# Patient Record
Sex: Male | Born: 1945
Health system: Southern US, Community
[De-identification: ages and names within clinical notes are randomized; demographics above are authoritative.]

## PROBLEM LIST (undated history)

## (undated) DIAGNOSIS — N529 Male erectile dysfunction, unspecified: Secondary | ICD-10-CM

## (undated) DIAGNOSIS — I10 Essential (primary) hypertension: Secondary | ICD-10-CM

## (undated) DIAGNOSIS — A159 Respiratory tuberculosis unspecified: Secondary | ICD-10-CM

## (undated) DIAGNOSIS — R7303 Prediabetes: Secondary | ICD-10-CM

## (undated) DIAGNOSIS — M1990 Unspecified osteoarthritis, unspecified site: Secondary | ICD-10-CM

## (undated) DIAGNOSIS — I739 Peripheral vascular disease, unspecified: Secondary | ICD-10-CM

## (undated) DIAGNOSIS — I8390 Asymptomatic varicose veins of unspecified lower extremity: Secondary | ICD-10-CM

## (undated) DIAGNOSIS — Z972 Presence of dental prosthetic device (complete) (partial): Secondary | ICD-10-CM

## (undated) DIAGNOSIS — K635 Polyp of colon: Secondary | ICD-10-CM

## (undated) DIAGNOSIS — R413 Other amnesia: Secondary | ICD-10-CM

## (undated) DIAGNOSIS — K4090 Unilateral inguinal hernia, without obstruction or gangrene, not specified as recurrent: Secondary | ICD-10-CM

## (undated) DIAGNOSIS — E785 Hyperlipidemia, unspecified: Secondary | ICD-10-CM

## (undated) DIAGNOSIS — Z8679 Personal history of other diseases of the circulatory system: Secondary | ICD-10-CM

## (undated) DIAGNOSIS — F0390 Unspecified dementia without behavioral disturbance: Secondary | ICD-10-CM

## (undated) DIAGNOSIS — N1831 Chronic kidney disease, stage 3a: Secondary | ICD-10-CM

## (undated) HISTORY — PX: LAPAROSCOPIC LOW ANTERIOR RESECTION: SHX5904

## (undated) HISTORY — DX: Male erectile dysfunction, unspecified: N52.9

## (undated) HISTORY — DX: Hyperlipidemia, unspecified: E78.5

## (undated) HISTORY — PX: APPENDECTOMY: SHX54

## (undated) HISTORY — PX: INGUINAL HERNIA REPAIR: SHX194

## (undated) HISTORY — DX: Asymptomatic varicose veins of unspecified lower extremity: I83.90

## (undated) HISTORY — DX: Other amnesia: R41.3

## (undated) HISTORY — PX: KNEE SURGERY: SHX244

## (undated) HISTORY — DX: Polyp of colon: K63.5

## (undated) HISTORY — PX: COLONOSCOPY: SHX174

---

## 1998-04-05 HISTORY — PX: INGUINAL HERNIA REPAIR: SHX194

## 1999-07-27 ENCOUNTER — Ambulatory Visit (HOSPITAL_COMMUNITY): Admission: RE | Admit: 1999-07-27 | Discharge: 1999-07-27 | Payer: Self-pay | Admitting: Gastroenterology

## 1999-11-27 ENCOUNTER — Ambulatory Visit (HOSPITAL_COMMUNITY): Admission: RE | Admit: 1999-11-27 | Discharge: 1999-11-27 | Payer: Self-pay | Admitting: Family Medicine

## 1999-11-27 ENCOUNTER — Encounter: Payer: Self-pay | Admitting: Family Medicine

## 2001-12-12 ENCOUNTER — Ambulatory Visit (HOSPITAL_COMMUNITY): Admission: RE | Admit: 2001-12-12 | Discharge: 2001-12-12 | Payer: Self-pay | Admitting: Gastroenterology

## 2003-02-11 ENCOUNTER — Ambulatory Visit (HOSPITAL_COMMUNITY): Admission: RE | Admit: 2003-02-11 | Discharge: 2003-02-11 | Payer: Self-pay | Admitting: Surgery

## 2003-06-25 ENCOUNTER — Ambulatory Visit: Admission: RE | Admit: 2003-06-25 | Discharge: 2003-06-25 | Payer: Self-pay | Admitting: Family Medicine

## 2003-06-26 ENCOUNTER — Encounter (HOSPITAL_BASED_OUTPATIENT_CLINIC_OR_DEPARTMENT_OTHER): Admission: RE | Admit: 2003-06-26 | Discharge: 2003-09-19 | Payer: Self-pay | Admitting: Internal Medicine

## 2003-09-18 ENCOUNTER — Encounter (HOSPITAL_BASED_OUTPATIENT_CLINIC_OR_DEPARTMENT_OTHER): Admission: RE | Admit: 2003-09-18 | Discharge: 2003-10-21 | Payer: Self-pay | Admitting: Internal Medicine

## 2006-10-04 DIAGNOSIS — K635 Polyp of colon: Secondary | ICD-10-CM

## 2006-10-04 HISTORY — DX: Polyp of colon: K63.5

## 2010-09-08 ENCOUNTER — Encounter (INDEPENDENT_AMBULATORY_CARE_PROVIDER_SITE_OTHER): Payer: Medicare Other | Admitting: Vascular Surgery

## 2010-09-08 DIAGNOSIS — I714 Abdominal aortic aneurysm, without rupture: Secondary | ICD-10-CM

## 2010-09-09 NOTE — Consult Note (Signed)
NEW PATIENT CONSULTATION  [name] Juarez, [name] Juarez DOB:  [birth year]                                       09/08/2010 ZHYQM#:[id number]  Mr. Vultaggio is a 65 year old male referred by Dr. Tiburcio Pea for an infrarenal abdominal aortic aneurysm measuring 4.4 cm in maximum diameter.  He recently had an ultrasound study as a screening tool and was found to have this aneurysm in the distal aorta.  He was aware that he had an aneurysm and has had no history of abdominal or back symptoms.  Also of significance is the fact that he has severe varicose veins in his right leg and these occurred about 10 years ago when he was awaiting knee surgery in [place] on the right side and had his leg tightly wrapped. Skin has changed significantly over the years becoming quite dark with an ulceration appearing on 1 occasion possibly due to some trauma.  He has had no history of DVT or thrombophlebitis.  He has long-leg elastic compression stockings which are 20-30 mm gradient but he has not worn them a regular basis.  Chronic medical problems: 1. Hyperlipidemia. 2. Varicose veins right leg with chronic skin changes. 3. Colon polyps. 4. Degenerative joint disease right knee. 5. History of tobacco abuse. 6. Negative for coronary artery disease, diabetes, hypertension or     stroke.  SOCIAL HISTORY:  He is married and has 2 children, retired Forensic scientist.  He smokes a pack cigarettes per day for 50 years and drinks 3 beers per day.  FAMILY HISTORY:  Positive for tuberculosis in his father.  Negative for coronary artery disease, diabetes, and stroke.  REVIEW OF SYSTEMS:  Positive for arthritis particularly in the right knee area.  He does have some aching and throbbing in the right thigh and itching of the skin in the right ankle area and chronic edema.  All other systems are negative in complete review of systems.  PHYSICAL EXAMINATION:  Blood pressure 136/74, heart rate 75, respirations  16. GENERAL:  He is a well-developed, well-nourished male in no apparent distress, alert and oriented x3. HEENT:  Exam normal for age.  EOMs intact. LUNGS:  Clear to auscultation.  No rhonchi or wheezing. CARDIOVASCULAR:  Regular rhythm.  No murmurs.  Carotid pulses are 3+ with no bruits audible. ABDOMEN:  Soft, nontender.  There is a 4-5 cm pulsatile mass which is palpable and nontender. MUSCULOSKELETAL:  Exam is free of major deformities. NEUROLOGIC:  Normal. SKIN:  Exam reveals bulging varicosities in the right medial thigh over a large area measuring about 10 x 15 cm with multiple tense varicosities, also a large varix in the distal thigh and proximal calf. He has severe hyperpigmentation and lipodermatosclerosis involving the lower third of the leg with crusty scaly skin medially but no active ulcer.  He has 3+ femoral, popliteal, and dorsalis pedis pulses palpable.  The aneurysm is not large enough to treat this time.  I will see him back in 1 year with a CT angiogram to determine if he is a candidate for stent grafting and also to monitor the size.  I discussed this with him at length.  Varicose veins are an issue which need to be treated.  He has severe skin changes and is symptomatic.  He does have appropriate long-leg stockings (20-30 mm) which he will begin wearing today as well as trying elevation  and ibuprofen.  I will see him in 3 months with a duplex scan of his right lower extremity to check for reflux and see what options are available.    Quita Skye Hart [place], M.D. Electronically Signed  JDL/MEDQ  D:  09/08/2010  T:  09/09/2010  Job:  [id number]

## 2010-11-19 ENCOUNTER — Encounter: Payer: Self-pay | Admitting: Vascular Surgery

## 2010-12-14 ENCOUNTER — Encounter: Payer: Self-pay | Admitting: Vascular Surgery

## 2010-12-15 ENCOUNTER — Ambulatory Visit (INDEPENDENT_AMBULATORY_CARE_PROVIDER_SITE_OTHER): Payer: Medicare Other | Admitting: Vascular Surgery

## 2010-12-15 ENCOUNTER — Ambulatory Visit (INDEPENDENT_AMBULATORY_CARE_PROVIDER_SITE_OTHER): Payer: Medicare Other | Admitting: *Deleted

## 2010-12-15 VITALS — BP 169/75 | HR 64 | Resp 20 | Ht 69.0 in | Wt 178.0 lb

## 2010-12-15 DIAGNOSIS — I83893 Varicose veins of bilateral lower extremities with other complications: Secondary | ICD-10-CM

## 2010-12-15 DIAGNOSIS — I83899 Varicose veins of unspecified lower extremities with other complications: Secondary | ICD-10-CM

## 2010-12-15 NOTE — Progress Notes (Signed)
Addended by: Clementeen Hoof on: 12/15/2010 10:46 AM   Modules accepted: Orders

## 2010-12-15 NOTE — Progress Notes (Signed)
Subjective:     Patient ID: [name] Juarez, male   DOB: 10/24/1945, 65 y.o.   MRN: [id number]  HPI this 65 year old male returns for further evaluation of his severe venous insufficiency of the right leg . He has had bulging varicosities in the right medial thigh and medial calf extending down to the ankle for 20 years or so. These have continued to enlarge and become increasingly symptomatic with aching throbbing and burning discomfort. He also had was difficult to feel in the right ankle area.a history of an ulceration which was traumatic in origin butDifficult to heal and the right ankle area. He has no history of DVT or thrombophlebitis . He has been wearing a long-leg plastic compression stockings-20-30 mm gradient as well as trying elevation of the leg and ibuprofen with no improvement in his symptomatology.  Past Medical History  Diagnosis Date  . Hyperlipidemia   . Varicose veins   . Erectile dysfunction   . Colon polyp 10/2006    History  Substance Use Topics  . Smoking status: Current Everyday Smoker -- 1.0 packs/day  . Smokeless tobacco: Not on file  . Alcohol Use: 12.6 oz/week    21 Cans of beer per week    No family history on file.  No Known Allergies  Current outpatient prescriptions:sildenafil (VIAGRA) 100 MG tablet, Take 100 mg by mouth as needed.  , Disp: , Rfl:   BP 169/75  Pulse 64  Resp 20  Ht 5\' 9"  (1.753 m)  Wt 178 lb (80.74 kg)  BMI 26.29 kg/m2  Body mass index is 26.29 kg/(m^2).         Review of Systems     Objective:   Physical Exam blood pressure 110/75 heart rate 64 respirations 20 Is alert and oriented x3 in no apparent distress Lower extremity exam reveals large nest of bulging varicosities in the medial thigh of the great saphenous system extending down into the medial calf into the ankle area with hyperpigmentation. He has 3+ dorsalis pedis pulses palpable.  Today I ordered a venous duplex exam which are reviewed and interpreted. He has  gross reflux in the right great saphenous system beginning at the junction extending to the knee. The deep system has mild reflux and no DVT.     Assessment:     Severe venous insufficiency right leg with valvular incompetence right great saphenous system severe reflux and bulging varicosities which are quite symptomatic.     Plan:     We'll schedule for laser ablation right great saphenous vein. This may require to entry sites as there is quite a bit of tortuosity related to the bulging varicosities in the thigh. Following this he'll return in 3 months for consideration of stab phlebectomy. We will proceed in the near future with his initial procedure

## 2010-12-22 NOTE — Procedures (Unsigned)
LOWER EXTREMITY VENOUS REFLUX EXAM  INDICATION:  Varicose veins.  EXAM:  Using color-flow imaging and pulse Doppler spectral analysis, the right common femoral, superficial femoral, popliteal, posterior tibial, greater and lesser saphenous veins are evaluated.  There is evidence suggesting deep venous reflux of >500 milliseconds noted in the common femoral and femoral veins.  The right saphenofemoral junction and nontortuous GSV demonstrates reflux of >500 milliseconds.  The right proximal short saphenous vein demonstrates competency.  GSV Diameter (used if found to be incompetent only)                                           Right    Left Proximal Greater Saphenous Vein           0.7 cm   cm Proximal-to-mid-thigh                     cm       cm Mid thigh                                 0.4 cm   cm Mid-distal thigh                          cm       cm Distal thigh                              0.71 cm  cm Knee                                      0.41 cm  cm  IMPRESSION:  Right greater saphenous, common femoral, and femoral vein reflux >572milliseconds noted, as described above.  ___________________________________________ Quita Skye. Hart [place], M.D.  CH/MEDQ  D:  12/16/2010  T:  12/16/2010  Job:  [id number]

## 2011-01-08 ENCOUNTER — Encounter: Payer: Self-pay | Admitting: Surgery

## 2011-01-11 ENCOUNTER — Encounter: Payer: Self-pay | Admitting: Vascular Surgery

## 2011-01-11 ENCOUNTER — Ambulatory Visit (INDEPENDENT_AMBULATORY_CARE_PROVIDER_SITE_OTHER): Payer: Medicare Other | Admitting: Vascular Surgery

## 2011-01-11 VITALS — BP 179/81 | HR 79 | Resp 20 | Ht 69.0 in | Wt 173.0 lb

## 2011-01-11 DIAGNOSIS — I83893 Varicose veins of bilateral lower extremities with other complications: Secondary | ICD-10-CM

## 2011-01-11 NOTE — Progress Notes (Signed)
Subjective:     Patient ID: [name] Juarez, male   DOB: [birth year], 65 y.o.   MRN: [id number]  HPI this 64 year old male patient underwent laser ablation of the right great saphenous vein for painful bulging varicosities secondary to valvular incompetence of the right great saphenous system this was done under local tumescent anesthesia. He tolerated the procedure well. This required 2 entrance sites because of tortuosity of the great saphenous vein and large bulging varicosities in the mid thigh. The first entrance site was in the mid calf area in the GS V. The guidewire would not traverse the entire GS V. This required a second entrance site the junction of the mid to proximal thigh. This was up to the saphenofemoral junction. He tolerated the procedure well.   Review of Systems     Objective:   Physical Exam blood pressure 179/81 heart rate 79 respirations 20     Assessment:    successful and well tolerated laser ablation of right great saphenous system requiring 2 separate entrance sites in right GSV    Plan:    return in one week for followup duplex scan of right leg to confirm closure of right GS V. Will take ibuprofen as directed and wear a long-leg elastic compression stockings and elevate legs when necessary.

## 2011-01-11 NOTE — Progress Notes (Signed)
40981 R GSV

## 2011-01-11 NOTE — Progress Notes (Signed)
Addended by: Clementeen Hoof on: 01/11/2011 10:24 AM   Modules accepted: Orders

## 2011-01-12 ENCOUNTER — Telehealth: Payer: Self-pay | Admitting: *Deleted

## 2011-01-12 NOTE — Telephone Encounter (Signed)
01/12/2011  Time: 9:58 AM   Patient Name: [name] Juarez  Patient of: Hart Rochester  Procedure:36478 L GSV  Reached patient at home. Had a good night. Some soreness. Using ice and taking Ibuprofen as instructed. Will see him next week for his ultrasound and f/u with Dr. Hart Rochester.

## 2011-01-18 ENCOUNTER — Encounter: Payer: Self-pay | Admitting: Vascular Surgery

## 2011-01-19 ENCOUNTER — Other Ambulatory Visit: Payer: Self-pay | Admitting: *Deleted

## 2011-01-19 ENCOUNTER — Ambulatory Visit (INDEPENDENT_AMBULATORY_CARE_PROVIDER_SITE_OTHER): Payer: Medicare Other | Admitting: Vascular Surgery

## 2011-01-19 ENCOUNTER — Other Ambulatory Visit (INDEPENDENT_AMBULATORY_CARE_PROVIDER_SITE_OTHER): Payer: Medicare Other | Admitting: *Deleted

## 2011-01-19 ENCOUNTER — Encounter: Payer: Self-pay | Admitting: Vascular Surgery

## 2011-01-19 VITALS — BP 182/98 | HR 79 | Resp 16 | Ht 69.0 in | Wt 175.3 lb

## 2011-01-19 DIAGNOSIS — Z48812 Encounter for surgical aftercare following surgery on the circulatory system: Secondary | ICD-10-CM

## 2011-01-19 DIAGNOSIS — I83893 Varicose veins of bilateral lower extremities with other complications: Secondary | ICD-10-CM

## 2011-01-19 NOTE — Progress Notes (Signed)
Subjective:     Patient ID: [name] Juarez, male   DOB: Nov 16, 1945, 65 y.o.   MRN: [id number]  HPI this 65 year old male patient returns 1 week post laser ablation right great saphenous vein appeared he has a history of a venous stasis ulcer recently healed and large bulging varicosities in the medial thigh and calf which have been quite symptomatic he denies any swelling or change in his symptomatology in the past week. He has had some mild to moderate discomfort along the course of the right great saphenous vein from the groin to the knee. Said no shortness of breath, hemoptysis, or dyspnea on exertion..  Past Medical History  Diagnosis Date  . Hyperlipidemia   . Varicose veins   . Erectile dysfunction   . Colon polyp 10/2006    History  Substance Use Topics  . Smoking status: Current Everyday Smoker -- 1.0 packs/day  . Smokeless tobacco: Not on file  . Alcohol Use: 12.6 oz/week    21 Cans of beer per week    No family history on file.  No Known Allergies  Current outpatient prescriptions:sildenafil (VIAGRA) 100 MG tablet, Take 100 mg by mouth as needed.  , Disp: , Rfl:   BP 182/98  Pulse 79  Resp 16  Ht 5\' 9"  (1.753 m)  Wt 175 lb 4.8 oz (79.516 kg)  BMI 25.89 kg/m2  Body mass index is 25.89 kg/(m^2).         Review of Systems     Objective:   Physical Exam blood pressure 180/98 heart rate 79 respirations 16 Gen. he is well-developed well-nourished male in no apparent distress alert and oriented x3 Chest is clear no rhonchi or wheezing Lower extremity exam reveals 3+ femoral and dorsalis pedis pulse in the right leg. He has a large nest of varicosities in the right medial thigh which are partially thrombosed. He continues to have bulging varicosities in the medial calf but not as tense as they were prior to the ablation. There is mild tenderness  along the course of the saphenous vein.  Today I ordered a venous duplex exam of the right leg which I reviewed and  interpreted. There is successful closure of the right great saphenous vein. There is mild bulging into the common femoral vein at the level of the saphenofemoral junction with no DVT.    Assessment:    successful ablation right great saphenous vein for history of stasis ulcer and bulging painful varicosities.    Plan:    return in 3 months with venous duplex exam to check saphenofemoral junction. Continue long-leg elastic compression support Begin aspirin 81 mg per day Will check him in 3 months to see if there is a need for stab phlebectomy of secondary varicosities sign

## 2011-01-25 NOTE — Procedures (Unsigned)
DUPLEX DEEP VENOUS EXAM - LOWER EXTREMITY  INDICATION:  One week followup of right GSV ablation.  HISTORY:  Edema:  Yes. Trauma/Surgery:  Right GSV ablation. Pain:  Yes. PE:  No. Previous DVT:  No. Anticoagulants:  No. Other:  DUPLEX EXAM:               CFV   SFV   PopV  PTV    GSV               R  L  R  L  R  L  R   L  R  L Thrombosis    o     o     o     o      + Spontaneous   +     +     +     +      o Phasic        +     +     +     +      o Augmentation  +     +     +     +      o Compressible  p     +     +     +      o Competent  Legend:  + - yes  o - no  p - partial  D - decreased   IMPRESSION: 1. Evidence of mild extension of thrombus into the common femoral     vein, status post greater saphenous vein ablation. 2. The great saphenous vein is large and tortuous and still     demonstrates segmental reflux in areas where ablation is     nonocclusive.        _____________________________ Quita Skye. Hart [place], M.D.  LT/MEDQ  D:  01/19/2011  T:  01/19/2011  Job:  [id number]

## 2011-03-12 ENCOUNTER — Encounter: Payer: Self-pay | Admitting: Vascular Surgery

## 2011-04-06 HISTORY — PX: OTHER SURGICAL HISTORY: SHX169

## 2011-04-26 ENCOUNTER — Encounter: Payer: Self-pay | Admitting: Vascular Surgery

## 2011-04-27 ENCOUNTER — Encounter: Payer: Self-pay | Admitting: Vascular Surgery

## 2011-04-27 ENCOUNTER — Other Ambulatory Visit (INDEPENDENT_AMBULATORY_CARE_PROVIDER_SITE_OTHER): Payer: Medicare Other | Admitting: *Deleted

## 2011-04-27 ENCOUNTER — Ambulatory Visit (INDEPENDENT_AMBULATORY_CARE_PROVIDER_SITE_OTHER): Payer: Medicare Other | Admitting: Vascular Surgery

## 2011-04-27 VITALS — BP 138/87 | HR 83 | Resp 20 | Ht 69.0 in | Wt 173.0 lb

## 2011-04-27 DIAGNOSIS — I83893 Varicose veins of bilateral lower extremities with other complications: Secondary | ICD-10-CM | POA: Insufficient documentation

## 2011-04-27 NOTE — Progress Notes (Signed)
Subjective:     Patient ID: [name] Juarez, male   DOB: [birth year], 66 y.o.   MRN: [id number]  HPI this 66 year old male returns for continued followup regarding his venous insufficiency of the right leg. He had laser ablation performed about 3 months ago in the right great saphenous vein is postop ultrasound did reveal some bulging of thrombus at the saphenofemoral junction but no true DVT. He has developed thrombosis of the varicosities in the right thigh and states that the distal edema is much improved over prior to the procedure. This shoes fit much better and her not as tight. He denies any aching discomfort in the residual varicosities in the right medial calf. He has no symptoms in the left leg..  Past Medical History  Diagnosis Date  . Hyperlipidemia   . Varicose veins   . Erectile dysfunction   . Colon polyp 10/2006    History  Substance Use Topics  . Smoking status: Current Everyday Smoker -- 1.0 packs/day  . Smokeless tobacco: Not on file  . Alcohol Use: 12.6 oz/week    21 Cans of beer per week    No family history on file.  No Known Allergies  Current outpatient prescriptions:sildenafil (VIAGRA) 100 MG tablet, Take 100 mg by mouth as needed.  , Disp: , Rfl:   BP 138/87  Pulse 83  Resp 20  Ht 5\' 9"  (1.753 m)  Wt 173 lb (78.472 kg)  BMI 25.55 kg/m2  Body mass index is 25.55 kg/(m^2).         Review of Systems denies chest pain, dyspnea on exertion, PND, orthopnea, hemoptysis, claudication.    Objective:   Physical Exam pressure 130/87 heart rate 83 respirations 20 General well-developed well-nourished male no apparent distress alert and oriented x3 Lungs no rhonchi or wheezing Lower extremity exam reveals 3+ femoral and 2+ dorsalis pedis pulse palpable bilaterally. Right leg has multiple thrombosed varicosities in the medial thigh over the great saphenous system. There are some patent varicosities in the medial calf below the knee which are not thrombosed but are  nontender. No edema distally.  Today ordered a venous duplex exam which are reviewed and interpreted. There is no DVT. The thrombus at the saphenofemoral junction is completely resolved. The great saphenous vein has areas of reflux and areas of complete thrombosis area there are multiple thrombosed varicosities.     Assessment:     Venous insufficiency right leg-status post laser ablation right great saphenous vein with incomplete total closure but multiple areas of total closure and right great saphenous system Multiple thrombosed varicosities in right thigh A symptomatic bulging varicosities in right calf    Plan:     Return to see [place] on when necessary basis

## 2011-05-03 NOTE — Procedures (Unsigned)
DUPLEX DEEP VENOUS EXAM - LOWER EXTREMITY  INDICATION:  Followup of right greater saphenous vein ablation.  HISTORY:  Edema:  No Trauma/Surgery:  Right greater saphenous vein ablation. Pain:  No PE:  No Previous DVT:  Mild extension of thrombus into the right common femoral vein status post ablation per study of 01/19/2011. Anticoagulants:  No Other:  DUPLEX EXAM:               CFV       SFV        PopV      PTV       GSV               R      L  R       L  R      L  R      L  R       L Thrombosis    0         0          0         0         0 Spontaneous   +         +          +         +         0 Phasic        +         +          +         +         0 Augmentation  +         +          +         +         0 Compressible  +         +          +         +         0 Competent     0         0          +         +         0  Legend:  + - yes  o - no  p - partial  D - decreased  IMPRESSION: 1.No evidence of deep venous thrombosis, thrombus no longer visualized in the common femoral vein status post vein ablation.  2.The greater saphenous vein remains large and demonstrates significant reflux in areas where ablation is nonocclusive. 3.Thrombosed branch noted from the proximal to mid thigh with multiple thrombosed varicosities in the distal thigh.   _____________________________ Quita Skye. Hart [place], M.D.  SS/MEDQ  D:  04/27/2011  T:  04/27/2011  Job:  [id number]

## 2011-06-28 ENCOUNTER — Other Ambulatory Visit: Payer: Self-pay | Admitting: *Deleted

## 2011-06-28 DIAGNOSIS — I714 Abdominal aortic aneurysm, without rupture: Secondary | ICD-10-CM

## 2011-09-02 ENCOUNTER — Other Ambulatory Visit: Payer: Self-pay | Admitting: Vascular Surgery

## 2011-09-02 LAB — CREATININE, SERUM: Creat: 0.97 mg/dL (ref 0.50–1.35)

## 2011-09-02 LAB — BUN: BUN: 17 mg/dL (ref 6–23)

## 2011-09-06 ENCOUNTER — Encounter: Payer: Self-pay | Admitting: Vascular Surgery

## 2011-09-07 ENCOUNTER — Inpatient Hospital Stay: Admission: RE | Admit: 2011-09-07 | Payer: Medicare Other | Source: Ambulatory Visit

## 2011-09-07 ENCOUNTER — Ambulatory Visit: Payer: Medicare Other | Admitting: Vascular Surgery

## 2011-09-27 ENCOUNTER — Encounter: Payer: Self-pay | Admitting: Vascular Surgery

## 2011-09-28 ENCOUNTER — Encounter: Payer: Self-pay | Admitting: Vascular Surgery

## 2011-09-28 ENCOUNTER — Ambulatory Visit (INDEPENDENT_AMBULATORY_CARE_PROVIDER_SITE_OTHER): Payer: Medicare Other | Admitting: Vascular Surgery

## 2011-09-28 ENCOUNTER — Ambulatory Visit
Admission: RE | Admit: 2011-09-28 | Discharge: 2011-09-28 | Disposition: A | Discharge status: Home / Self Care | Payer: Medicare Other | Source: Ambulatory Visit | Attending: Vascular Surgery | Admitting: Vascular Surgery

## 2011-09-28 VITALS — BP 112/60 | HR 87 | Temp 98.2°F | Ht 69.0 in | Wt 168.0 lb

## 2011-09-28 DIAGNOSIS — Z01810 Encounter for preprocedural cardiovascular examination: Secondary | ICD-10-CM

## 2011-09-28 DIAGNOSIS — I714 Abdominal aortic aneurysm, without rupture, unspecified: Secondary | ICD-10-CM | POA: Insufficient documentation

## 2011-09-28 MED ORDER — IOHEXOL 350 MG/ML SOLN
100.0000 mL | Freq: Once | INTRAVENOUS | Status: AC | PRN
  Administered 2011-09-28: 100 mL via INTRAVENOUS

## 2011-09-28 NOTE — Progress Notes (Signed)
Subjective:     Patient ID: [name] Juarez, male   DOB: 06-29-1945, 66 y.o.   MRN: [id number]  HPI this 66 year old male is evaluated today in continued followup for his abdominal aortic aneurysm which was discovered one year ago. Today he had a CT angiogram performed. He denies any recent abdominal or back pain. I had seen him in the past several months for venous insufficiency which has been treated with laser ablation. He denies claudication symptoms.  Past Medical History  Diagnosis Date  . Hyperlipidemia   . Varicose veins   . Erectile dysfunction   . Colon polyp 10/2006    History  Substance Use Topics  . Smoking status: Current Everyday Smoker -- 1.0 packs/day    Types: Cigarettes  . Smokeless tobacco: Never Used  . Alcohol Use: 12.6 oz/week    21 Cans of beer per week    History reviewed. No pertinent family history.  No Known Allergies  Current outpatient prescriptions:losartan (COZAAR) 25 MG tablet, Take 25 mg by mouth daily., Disp: , Rfl: ;  sildenafil (VIAGRA) 100 MG tablet, Take 100 mg by mouth as needed.  , Disp: , Rfl:  No current facility-administered medications for this visit. Facility-Administered Medications Ordered in Other Visits: iohexol (OMNIPAQUE) 350 MG/ML injection 100 mL, 100 mL, Intravenous, Once PRN, Medication Radiologist, MD, 100 mL at 09/28/11 1253  BP 112/60  Pulse 87  Temp 98.2 F (36.8 C) (Oral)  Ht 5\' 9"  (1.753 m)  Wt 168 lb (76.204 kg)  BMI 24.81 kg/m2  Body mass index is 24.81 kg/(m^2).        Review of Systems denies chest pain, dyspnea on exertion, PND, orthopnea, claudication, joint pain. All systems negative and complete review of systems     Objective:   Physical Exam blood pressure 112/60 heart rate 87 respirations 14 Gen.-alert and oriented x3 in no apparent distress HEENT normal for age Lungs no rhonchi or wheezing Cardiovascular regular rhythm no murmurs carotid pulses 3+ palpable no bruits audible Abdomen soft  nontender-5 cm pulsatile mass in midepigastric Musculoskeletal free of  major deformities Skin clear -no rashes Neurologic normal Lower extremities 3+ femoral and dorsalis pedis pulses palpable bilaterally with no edema  Today I ordered a CT angiogram which are reviewed by computer. Patient now has a 5.3 cm infrarenal abdominal aortic aneurysm. Infrarenal neck is slightly irregular but I do think he is a candidate for aortic stent grafting      Assessment:     Abdominal aortic aneurysm-probable candidate for aortic stent graft    Plan:     #1 schedule Cardiolite exam #2 Will review CT angiogram with colleagues #3 return in 2-3 weeks for further discussion regarding treatment options

## 2011-09-29 NOTE — Addendum Note (Signed)
Addended by: Sharee Pimple on: 09/29/2011 09:53 AM   Modules accepted: Orders

## 2011-10-25 ENCOUNTER — Encounter: Payer: Self-pay | Admitting: Vascular Surgery

## 2011-10-26 ENCOUNTER — Encounter: Payer: Self-pay | Admitting: Vascular Surgery

## 2011-10-26 ENCOUNTER — Ambulatory Visit (INDEPENDENT_AMBULATORY_CARE_PROVIDER_SITE_OTHER): Payer: Medicare Other | Admitting: Vascular Surgery

## 2011-10-26 VITALS — BP 137/74 | HR 83 | Resp 16 | Ht 69.0 in | Wt 170.0 lb

## 2011-10-26 DIAGNOSIS — I714 Abdominal aortic aneurysm, without rupture: Secondary | ICD-10-CM

## 2011-10-26 NOTE — Progress Notes (Signed)
Subjective:     Patient ID: [name] Juarez, male   DOB: 09/10/1945, 66 y.o.   MRN: [id number]  HPI this 66-year-old male returns today to for further discussion regarding his abdominal aortic aneurysm. CT angiogram has been reviewed and we do feel that he is a satisfactory candidate for aortic stent grafting. Cardiolite was performed and is a low risk study. He has no history of coronary artery disease. He has an excellent ejection fraction.  Past Medical History  Diagnosis Date  . Hyperlipidemia   . Varicose veins   . Erectile dysfunction   . Colon polyp 10/2006    History  Substance Use Topics  . Smoking status: Current Everyday Smoker -- 1.0 packs/day    Types: Cigarettes  . Smokeless tobacco: Never Used  . Alcohol Use: 12.6 oz/week    21 Cans of beer per week    History reviewed. No pertinent family history.  No Known Allergies  Current outpatient prescriptions:losartan (COZAAR) 25 MG tablet, Take 25 mg by mouth daily., Disp: , Rfl: ;  sildenafil (VIAGRA) 100 MG tablet, Take 100 mg by mouth as needed.  , Disp: , Rfl:   BP 137/74  Pulse 83  Resp 16  Ht 5' 9" (1.753 m)  Wt 170 lb (77.111 kg)  BMI 25.10 kg/m2  SpO2 99%  Body mass index is 25.10 kg/(m^2).           Review of Systems denies chest pain, dyspnea on exertion, PND, orthopnea, claudication, hemoptysis.     Objective:   Physical Exam blood pressure 137/74 heart rate 83 respirations 16 Gen.-alert and oriented x3 in no apparent distress HEENT normal for age Lungs no rhonchi or wheezing Cardiovascular regular rhythm no murmurs carotid pulses 3+ palpable no bruits audible Abdomen soft nontender -5 cm pulsatile mass in the midepigastrium Musculoskeletal free of  major deformities Skin clear -no rashes Neurologic normal Lower extremities 3+ femoral and dorsalis pedis pulses palpable bilaterally with no edema-varicose veins both legs from knee to ankle     Assessment:     5.3 cm infrarenal abdominal  aortic aneurysm No evidence of coronary artery disease-negative Cardiolite    Plan:     Plan aortic stent grafting of infrarenal abdominal aortic aneurysm using Gore aorto by common iliac graft-scheduled for Wednesday, July 31 Risks discussed with patient and wife and potential for open repair also discussed      

## 2011-10-27 ENCOUNTER — Encounter (HOSPITAL_COMMUNITY): Payer: Self-pay | Admitting: Pharmacy Technician

## 2011-10-27 ENCOUNTER — Other Ambulatory Visit: Payer: Self-pay

## 2011-11-01 ENCOUNTER — Encounter (HOSPITAL_COMMUNITY)
Admission: RE | Admit: 2011-11-01 | Discharge: 2011-11-01 | Disposition: A | Discharge status: Home / Self Care | Payer: Medicare Other | Source: Ambulatory Visit | Attending: Vascular Surgery | Admitting: Vascular Surgery

## 2011-11-01 ENCOUNTER — Ambulatory Visit (HOSPITAL_COMMUNITY)
Admission: RE | Admit: 2011-11-01 | Discharge: 2011-11-01 | Disposition: A | Discharge status: Home / Self Care | Payer: Medicare Other | Source: Ambulatory Visit | Attending: Vascular Surgery | Admitting: Vascular Surgery

## 2011-11-01 ENCOUNTER — Encounter (HOSPITAL_COMMUNITY): Payer: Self-pay

## 2011-11-01 DIAGNOSIS — I714 Abdominal aortic aneurysm, without rupture, unspecified: Principal | ICD-10-CM | POA: Diagnosis present

## 2011-11-01 DIAGNOSIS — I10 Essential (primary) hypertension: Secondary | ICD-10-CM | POA: Diagnosis present

## 2011-11-01 DIAGNOSIS — Z01810 Encounter for preprocedural cardiovascular examination: Secondary | ICD-10-CM | POA: Insufficient documentation

## 2011-11-01 DIAGNOSIS — Z860100 Personal history of colon polyps, unspecified: Secondary | ICD-10-CM

## 2011-11-01 DIAGNOSIS — F17200 Nicotine dependence, unspecified, uncomplicated: Secondary | ICD-10-CM | POA: Diagnosis present

## 2011-11-01 DIAGNOSIS — Z01818 Encounter for other preprocedural examination: Secondary | ICD-10-CM | POA: Insufficient documentation

## 2011-11-01 DIAGNOSIS — Z01812 Encounter for preprocedural laboratory examination: Secondary | ICD-10-CM | POA: Insufficient documentation

## 2011-11-01 DIAGNOSIS — E785 Hyperlipidemia, unspecified: Secondary | ICD-10-CM | POA: Diagnosis present

## 2011-11-01 DIAGNOSIS — M129 Arthropathy, unspecified: Secondary | ICD-10-CM | POA: Diagnosis present

## 2011-11-01 HISTORY — DX: Respiratory tuberculosis unspecified: A15.9

## 2011-11-01 HISTORY — DX: Unspecified osteoarthritis, unspecified site: M19.90

## 2011-11-01 HISTORY — DX: Essential (primary) hypertension: I10

## 2011-11-01 LAB — COMPREHENSIVE METABOLIC PANEL
ALT: 17 U/L (ref 0–53)
AST: 19 U/L (ref 0–37)
Albumin: 4.1 g/dL (ref 3.5–5.2)
Alkaline Phosphatase: 53 U/L (ref 39–117)
BUN: 13 mg/dL (ref 6–23)
CO2: 25 mEq/L (ref 19–32)
Calcium: 9.4 mg/dL (ref 8.4–10.5)
Chloride: 102 mEq/L (ref 96–112)
Creatinine, Ser: 0.96 mg/dL (ref 0.50–1.35)
GFR calc Af Amer: >90 mL/min (ref >90)
GFR calc non Af Amer: 84 mL/min — ABNORMAL LOW (ref >90)
Glucose, Bld: 141 mg/dL — ABNORMAL HIGH (ref 70–99)
Potassium: 4.2 mEq/L (ref 3.5–5.1)
Sodium: 138 mEq/L (ref 135–145)
Total Bilirubin: 0.4 mg/dL (ref 0.3–1.2)
Total Protein: 7.3 g/dL (ref 6.0–8.3)

## 2011-11-01 LAB — URINALYSIS, ROUTINE W REFLEX MICROSCOPIC
Bilirubin Urine: NEGATIVE
Glucose, UA: NEGATIVE mg/dL
Hgb urine dipstick: NEGATIVE
Ketones, ur: NEGATIVE mg/dL
Leukocytes, UA: NEGATIVE
Nitrite: NEGATIVE
Protein, ur: NEGATIVE mg/dL
Specific Gravity, Urine: 1.02 (ref 1.005–1.030)
Urobilinogen, UA: 0.2 mg/dL (ref 0.0–1.0)
pH: 5.5 (ref 5.0–8.0)

## 2011-11-01 LAB — CBC
HCT: 45.8 % (ref 39.0–52.0)
Hemoglobin: 15.4 g/dL (ref 13.0–17.0)
MCH: 33.8 pg (ref 26.0–34.0)
MCHC: 33.6 g/dL (ref 30.0–36.0)
MCV: 100.4 fL — ABNORMAL HIGH (ref 78.0–100.0)
Platelets: 222 10*3/uL (ref 150–400)
RBC: 4.56 MIL/uL (ref 4.22–5.81)
RDW: 13.9 % (ref 11.5–15.5)
WBC: 7.3 10*3/uL (ref 4.0–10.5)

## 2011-11-01 LAB — BLOOD GAS, ARTERIAL
Acid-base deficit: 3 mmol/L — ABNORMAL HIGH (ref 0.0–2.0)
Bicarbonate: 21 mEq/L (ref 20.0–24.0)
Drawn by: 24485
O2 Saturation: 98.2 %
Patient temperature: 98.6
TCO2: 22 mmol/L (ref 0–100)
pCO2 arterial: 34.2 mmHg — ABNORMAL LOW (ref 35.0–45.0)
pH, Arterial: 7.404 (ref 7.350–7.450)
pO2, Arterial: 87.8 mmHg (ref 80.0–100.0)

## 2011-11-01 LAB — SURGICAL PCR SCREEN
MRSA, PCR: NEGATIVE
Staphylococcus aureus: NEGATIVE

## 2011-11-01 LAB — APTT: aPTT: 30 seconds (ref 24–37)

## 2011-11-01 LAB — PROTIME-INR
INR: 1.09 (ref 0.00–1.49)
Prothrombin Time: 14.3 seconds (ref 11.6–15.2)

## 2011-11-01 NOTE — Pre-Procedure Instructions (Signed)
[address] Ike Maragh  11/01/2011   Your procedure is scheduled on:  11-03-2011  Report to [facility] at 6:30 AM.  Call this number if you have problems the morning of surgery: [phone]   Remember:   Do not eat food or drink:After Midnight.      Take these medicines the morning of surgery with A SIP OF WATER: none   Do not wear jewelry, make-up or nail polish.  Do not wear lotions, powders, or perfumes. You may wear deodorant.  Do not shave 48 hours prior to surgery. Men may shave face and neck.  Do not bring valuables to the hospital.  Contacts, dentures or bridgework may not be worn into surgery.  Leave suitcase in the car. After surgery it may be brought to your room.  For patients admitted to the hospital, checkout time is 11:00 AM the day of discharge.    Special Instructions: CHG Shower Use Special Wash: 1/2 bottle night before surgery and 1/2 bottle morning of surgery.   Please read over the following fact sheets that you were given: Pain Booklet, Coughing and Deep Breathing, Blood Transfusion Information, MRSA Information and Surgical Site Infection Prevention

## 2011-11-02 MED ORDER — DEXTROSE 5 % IV SOLN: 1.5000 g | INTRAVENOUS | Status: DC

## 2011-11-02 MED ORDER — SODIUM CHLORIDE 0.9 % IV SOLN: INTRAVENOUS | Status: DC

## 2011-11-03 ENCOUNTER — Encounter (HOSPITAL_COMMUNITY): Payer: Self-pay | Admitting: *Deleted

## 2011-11-03 ENCOUNTER — Inpatient Hospital Stay (HOSPITAL_COMMUNITY): Payer: Medicare Other | Admitting: Certified Registered"

## 2011-11-03 ENCOUNTER — Encounter (HOSPITAL_COMMUNITY)
Admission: RE | Disposition: A | Discharge status: Home / Self Care | Payer: Self-pay | Source: Ambulatory Visit | Attending: Vascular Surgery

## 2011-11-03 ENCOUNTER — Inpatient Hospital Stay (HOSPITAL_COMMUNITY): Payer: Medicare Other

## 2011-11-03 ENCOUNTER — Encounter (HOSPITAL_COMMUNITY): Payer: Self-pay | Admitting: Certified Registered"

## 2011-11-03 ENCOUNTER — Inpatient Hospital Stay (HOSPITAL_COMMUNITY)
Admission: RE | Admit: 2011-11-03 | Discharge: 2011-11-04 | DRG: 238 | Disposition: A | Discharge status: Home / Self Care | Payer: Medicare Other | Source: Ambulatory Visit | Attending: Vascular Surgery | Admitting: Vascular Surgery

## 2011-11-03 DIAGNOSIS — I714 Abdominal aortic aneurysm, without rupture, unspecified: Secondary | ICD-10-CM

## 2011-11-03 HISTORY — PX: ABDOMINAL AORTIC ENDOVASCULAR STENT GRAFT: SHX5707

## 2011-11-03 LAB — BASIC METABOLIC PANEL
BUN: 14 mg/dL (ref 6–23)
CO2: 23 mEq/L (ref 19–32)
Calcium: 8.4 mg/dL (ref 8.4–10.5)
Chloride: 104 mEq/L (ref 96–112)
Creatinine, Ser: 1.01 mg/dL (ref 0.50–1.35)
GFR calc Af Amer: 87 mL/min — ABNORMAL LOW (ref >90)
GFR calc non Af Amer: 75 mL/min — ABNORMAL LOW (ref >90)
Glucose, Bld: 116 mg/dL — ABNORMAL HIGH (ref 70–99)
Potassium: 4.7 mEq/L (ref 3.5–5.1)
Sodium: 136 mEq/L (ref 135–145)

## 2011-11-03 LAB — CBC
HCT: 41 % (ref 39.0–52.0)
Hemoglobin: 14.1 g/dL (ref 13.0–17.0)
MCH: 34.4 pg — ABNORMAL HIGH (ref 26.0–34.0)
MCHC: 34.4 g/dL (ref 30.0–36.0)
MCV: 100 fL (ref 78.0–100.0)
Platelets: 196 10*3/uL (ref 150–400)
RBC: 4.1 MIL/uL — ABNORMAL LOW (ref 4.22–5.81)
RDW: 13.8 % (ref 11.5–15.5)
WBC: 7.1 10*3/uL (ref 4.0–10.5)

## 2011-11-03 LAB — PREPARE RBC (CROSSMATCH)

## 2011-11-03 LAB — APTT: aPTT: 32 seconds (ref 24–37)

## 2011-11-03 LAB — PROTIME-INR
INR: 1.09 (ref 0.00–1.49)
Prothrombin Time: 14.3 seconds (ref 11.6–15.2)

## 2011-11-03 LAB — MAGNESIUM: Magnesium: 1.8 mg/dL (ref 1.5–2.5)

## 2011-11-03 LAB — ABO/RH: ABO/RH(D): A POS

## 2011-11-03 SURGERY — INSERTION, ENDOVASCULAR STENT GRAFT, AORTA, ABDOMINAL
Anesthesia: General | Wound class: Clean

## 2011-11-03 MED ORDER — DOCUSATE SODIUM 100 MG PO CAPS: 100.0000 mg | ORAL_CAPSULE | Freq: Every day | ORAL | Status: DC

## 2011-11-03 MED ORDER — HEPARIN SODIUM (PORCINE) 1000 UNIT/ML IJ SOLN
INTRAMUSCULAR | Status: DC | PRN
  Administered 2011-11-03: 6000 [IU] via INTRAVENOUS

## 2011-11-03 MED ORDER — FLEET ENEMA 7-19 GM/118ML RE ENEM: 1.0000 | ENEMA | Freq: Once | RECTAL | Status: AC | PRN

## 2011-11-03 MED ORDER — SODIUM CHLORIDE 0.9 % IR SOLN
Status: DC | PRN
  Administered 2011-11-03: 09:00:00

## 2011-11-03 MED ORDER — ONDANSETRON HCL 4 MG/2ML IJ SOLN
INTRAMUSCULAR | Status: DC | PRN
  Administered 2011-11-03: 4 mg via INTRAVENOUS

## 2011-11-03 MED ORDER — ONDANSETRON HCL 4 MG/2ML IJ SOLN: 4.0000 mg | Freq: Four times a day (QID) | INTRAMUSCULAR | Status: DC | PRN

## 2011-11-03 MED ORDER — ONDANSETRON HCL 4 MG/2ML IJ SOLN: 4.0000 mg | Freq: Once | INTRAMUSCULAR | Status: DC | PRN

## 2011-11-03 MED ORDER — HYDROMORPHONE HCL PF 1 MG/ML IJ SOLN: 0.2500 mg | INTRAMUSCULAR | Status: DC | PRN

## 2011-11-03 MED ORDER — METOPROLOL TARTRATE 1 MG/ML IV SOLN
INTRAVENOUS | Status: DC | PRN
  Administered 2011-11-03: 2.5 mg via INTRAVENOUS

## 2011-11-03 MED ORDER — ACETAMINOPHEN 650 MG RE SUPP: 325.0000 mg | RECTAL | Status: DC | PRN

## 2011-11-03 MED ORDER — METOPROLOL TARTRATE 1 MG/ML IV SOLN: 2.0000 mg | INTRAVENOUS | Status: DC | PRN

## 2011-11-03 MED ORDER — ASPIRIN EC 81 MG PO TBEC
81.0000 mg | DELAYED_RELEASE_TABLET | Freq: Every day | ORAL | Status: DC
  Filled 2011-11-03: qty 1

## 2011-11-03 MED ORDER — LOSARTAN POTASSIUM 25 MG PO TABS
25.0000 mg | ORAL_TABLET | Freq: Every day | ORAL | Status: DC
  Filled 2011-11-03: qty 1

## 2011-11-03 MED ORDER — ROCURONIUM BROMIDE 100 MG/10ML IV SOLN
INTRAVENOUS | Status: DC | PRN
  Administered 2011-11-03: 50 mg via INTRAVENOUS

## 2011-11-03 MED ORDER — HYDRALAZINE HCL 20 MG/ML IJ SOLN: 10.0000 mg | INTRAMUSCULAR | Status: DC | PRN

## 2011-11-03 MED ORDER — ALUM & MAG HYDROXIDE-SIMETH 200-200-20 MG/5ML PO SUSP: 15.0000 mL | ORAL | Status: DC | PRN

## 2011-11-03 MED ORDER — MIDAZOLAM HCL 5 MG/5ML IJ SOLN
INTRAMUSCULAR | Status: DC | PRN
  Administered 2011-11-03: 2 mg via INTRAVENOUS

## 2011-11-03 MED ORDER — DEXTROSE 5 % IV SOLN
1.5000 g | Freq: Two times a day (BID) | INTRAVENOUS | Status: AC
  Administered 2011-11-03 – 2011-11-04 (×2): 1.5 g via INTRAVENOUS
  Filled 2011-11-03 (×2): qty 1.5

## 2011-11-03 MED ORDER — FENTANYL CITRATE 0.05 MG/ML IJ SOLN
INTRAMUSCULAR | Status: DC | PRN
  Administered 2011-11-03: 150 ug via INTRAVENOUS
  Administered 2011-11-03: 100 ug via INTRAVENOUS

## 2011-11-03 MED ORDER — SODIUM CHLORIDE 0.9 % IV SOLN: 500.0000 mL | Freq: Once | INTRAVENOUS | Status: AC | PRN

## 2011-11-03 MED ORDER — NEOSTIGMINE METHYLSULFATE 1 MG/ML IJ SOLN
INTRAMUSCULAR | Status: DC | PRN
  Administered 2011-11-03: 4 mg via INTRAVENOUS

## 2011-11-03 MED ORDER — IODIXANOL 320 MG/ML IV SOLN
INTRAVENOUS | Status: DC | PRN
  Administered 2011-11-03: 150 mL via INTRA_ARTERIAL

## 2011-11-03 MED ORDER — BISACODYL 10 MG RE SUPP: 10.0000 mg | Freq: Every day | RECTAL | Status: DC | PRN

## 2011-11-03 MED ORDER — LABETALOL HCL 5 MG/ML IV SOLN
10.0000 mg | INTRAVENOUS | Status: DC | PRN
  Filled 2011-11-03: qty 4

## 2011-11-03 MED ORDER — LACTATED RINGERS IV SOLN
INTRAVENOUS | Status: DC | PRN
  Administered 2011-11-03: 08:00:00 via INTRAVENOUS

## 2011-11-03 MED ORDER — POTASSIUM CHLORIDE CRYS ER 20 MEQ PO TBCR: 20.0000 meq | EXTENDED_RELEASE_TABLET | Freq: Once | ORAL | Status: AC | PRN

## 2011-11-03 MED ORDER — VECURONIUM BROMIDE 10 MG IV SOLR
INTRAVENOUS | Status: DC | PRN
  Administered 2011-11-03: 2 mg via INTRAVENOUS

## 2011-11-03 MED ORDER — SENNOSIDES-DOCUSATE SODIUM 8.6-50 MG PO TABS
1.0000 | ORAL_TABLET | Freq: Every evening | ORAL | Status: DC | PRN
  Filled 2011-11-03: qty 1

## 2011-11-03 MED ORDER — GUAIFENESIN-DM 100-10 MG/5ML PO SYRP: 15.0000 mL | ORAL_SOLUTION | ORAL | Status: DC | PRN

## 2011-11-03 MED ORDER — GLYCOPYRROLATE 0.2 MG/ML IJ SOLN
INTRAMUSCULAR | Status: DC | PRN
  Administered 2011-11-03: .6 mg via INTRAVENOUS

## 2011-11-03 MED ORDER — PROPOFOL 10 MG/ML IV EMUL
INTRAVENOUS | Status: DC | PRN
  Administered 2011-11-03: 130 mg via INTRAVENOUS

## 2011-11-03 MED ORDER — PHENOL 1.4 % MT LIQD: 1.0000 | OROMUCOSAL | Status: DC | PRN

## 2011-11-03 MED ORDER — PROPOFOL 10 MG/ML IV EMUL: INTRAVENOUS | Status: DC | PRN

## 2011-11-03 MED ORDER — SODIUM CHLORIDE 0.9 % IV SOLN
INTRAVENOUS | Status: DC
  Administered 2011-11-03 (×2): via INTRAVENOUS

## 2011-11-03 MED ORDER — PROTAMINE SULFATE 10 MG/ML IV SOLN
INTRAVENOUS | Status: DC | PRN
  Administered 2011-11-03: 50 mg via INTRAVENOUS

## 2011-11-03 MED ORDER — DEXTROSE 5 % IV SOLN
10.0000 mg | INTRAVENOUS | Status: DC | PRN
  Administered 2011-11-03: 10 ug/min via INTRAVENOUS

## 2011-11-03 MED ORDER — ACETAMINOPHEN 325 MG PO TABS: 325.0000 mg | ORAL_TABLET | ORAL | Status: DC | PRN

## 2011-11-03 MED ORDER — PANTOPRAZOLE SODIUM 40 MG PO TBEC: 40.0000 mg | DELAYED_RELEASE_TABLET | Freq: Every day | ORAL | Status: DC

## 2011-11-03 MED ORDER — MORPHINE SULFATE 2 MG/ML IJ SOLN: 2.0000 mg | INTRAMUSCULAR | Status: DC | PRN

## 2011-11-03 MED ORDER — MAGNESIUM SULFATE 40 MG/ML IJ SOLN
2.0000 g | Freq: Once | INTRAMUSCULAR | Status: AC | PRN
  Filled 2011-11-03: qty 50

## 2011-11-03 MED ORDER — DOPAMINE-DEXTROSE 3.2-5 MG/ML-% IV SOLN: 3.0000 ug/kg/min | INTRAVENOUS | Status: DC

## 2011-11-03 MED ORDER — DEXTROSE 5 % IV SOLN
1.5000 g | Freq: Once | INTRAVENOUS | Status: AC
  Administered 2011-11-03: 1.5 g via INTRAVENOUS
  Filled 2011-11-03: qty 1.5

## 2011-11-03 MED ORDER — OXYCODONE HCL 5 MG PO TABS: 5.0000 mg | ORAL_TABLET | ORAL | Status: DC | PRN

## 2011-11-03 SURGICAL SUPPLY — 83 items
ADH SKN CLS APL DERMABOND .7 (GAUZE/BANDAGES/DRESSINGS) ×2
BAG BANDED W/RUBBER/TAPE 36X54 (MISCELLANEOUS) ×1 IMPLANT
BAG EQP BAND 135X91 W/RBR TAPE (MISCELLANEOUS) ×1
BAG SNAP BAND KOVER 36X36 (MISCELLANEOUS) ×4 IMPLANT
BALLN CODA OCL 2-9.0-35-120-3 (BALLOONS)
BALLOON COD OCL 2-9.0-35-120-3 (BALLOONS) IMPLANT
CANISTER SUCTION 2500CC (MISCELLANEOUS) ×2 IMPLANT
CATH BEACON 5.038 65CM KMP-01 (CATHETERS) ×1 IMPLANT
CATH OMNI FLUSH .035X70CM (CATHETERS) ×1 IMPLANT
CLIP TI MEDIUM 24 (CLIP) IMPLANT
CLIP TI WIDE RED SMALL 24 (CLIP) IMPLANT
CLOTH BEACON ORANGE TIMEOUT ST (SAFETY) ×2 IMPLANT
COVER MAYO STAND STRL (DRAPES) ×3 IMPLANT
COVER PROBE W GEL 5X96 (DRAPES) ×1 IMPLANT
COVER SURGICAL LIGHT HANDLE (MISCELLANEOUS) ×2 IMPLANT
DERMABOND ADVANCED (GAUZE/BANDAGES/DRESSINGS) ×2
DERMABOND ADVANCED .7 DNX12 (GAUZE/BANDAGES/DRESSINGS) ×1 IMPLANT
DEVICE CLOSURE PERCLS PRGLD 6F (VASCULAR PRODUCTS) IMPLANT
DRAIN CHANNEL 10F 3/8 F FF (DRAIN) IMPLANT
DRAIN CHANNEL 10M FLAT 3/4 FLT (DRAIN) IMPLANT
DRAPE TABLE COVER HEAVY DUTY (DRAPES) ×2 IMPLANT
DRESSING OPSITE X SMALL 2X3 (GAUZE/BANDAGES/DRESSINGS) ×2 IMPLANT
DRYSEAL FLEXSHEATH 12FR 33CM (SHEATH) ×1
DRYSEAL FLEXSHEATH 18FR 33CM (SHEATH) ×1
ELECT CAUTERY BLADE 6.4 (BLADE) IMPLANT
ELECT REM PT RETURN 9FT ADLT (ELECTROSURGICAL) ×4
ELECTRODE REM PT RTRN 9FT ADLT (ELECTROSURGICAL) ×2 IMPLANT
EVACUATOR 3/16  PVC DRAIN (DRAIN)
EVACUATOR 3/16 PVC DRAIN (DRAIN) IMPLANT
EVACUATOR SILICONE 100CC (DRAIN) IMPLANT
EXCLUDER TNK LEG 31MX14X15 (Endovascular Graft) IMPLANT
EXCLUDER TRUNK LEG 31MX14X15 (Endovascular Graft) ×2 IMPLANT
GLOVE BIOGEL PI IND STRL 7.0 (GLOVE) IMPLANT
GLOVE BIOGEL PI IND STRL 7.5 (GLOVE) IMPLANT
GLOVE BIOGEL PI INDICATOR 7.0 (GLOVE) ×2
GLOVE BIOGEL PI INDICATOR 7.5 (GLOVE) ×1
GLOVE SS BIOGEL STRL SZ 7 (GLOVE) ×1 IMPLANT
GLOVE SUPERSENSE BIOGEL SZ 7 (GLOVE) ×1
GLOVE SURG SS PI 7.0 STRL IVOR (GLOVE) ×1 IMPLANT
GLOVE SURG SS PI 7.5 STRL IVOR (GLOVE) ×1 IMPLANT
GOWN STRL NON-REIN LRG LVL3 (GOWN DISPOSABLE) ×5 IMPLANT
GOWN STRL REIN 2XL XLG LVL4 (GOWN DISPOSABLE) ×1 IMPLANT
GOWN STRL REIN XL XLG (GOWN DISPOSABLE) ×1 IMPLANT
GRAFT BALLN CATH 65CM (STENTS) IMPLANT
GRAFT EXCLUDER LEG (Endovascular Graft) ×1 IMPLANT
KIT BASIN OR (CUSTOM PROCEDURE TRAY) ×2 IMPLANT
KIT ROOM TURNOVER OR (KITS) ×2 IMPLANT
NAMIC PROTECTION STATION ×1 IMPLANT
NDL PERC 18GX7CM (NEEDLE) ×1 IMPLANT
NEEDLE PERC 18GX7CM (NEEDLE) ×2 IMPLANT
NS IRRIG 1000ML POUR BTL (IV SOLUTION) ×2 IMPLANT
PACK AORTA (CUSTOM PROCEDURE TRAY) ×2 IMPLANT
PAD ARMBOARD 7.5X6 YLW CONV (MISCELLANEOUS) ×4 IMPLANT
PENCIL BUTTON HOLSTER BLD 10FT (ELECTRODE) IMPLANT
PERCLOSE PROGLIDE 6F (VASCULAR PRODUCTS) ×8
SHEATH AVANTI 11CM 8FR (MISCELLANEOUS) ×1 IMPLANT
SHEATH BRITE TIP 8FR 23CM (MISCELLANEOUS) ×1 IMPLANT
SHEATH DRYSEAL FLEX 12FR 33CM (SHEATH) IMPLANT
SHEATH DRYSEAL FLEX 18FR 33CM (SHEATH) IMPLANT
SPONGE GAUZE 4X4 12PLY (GAUZE/BANDAGES/DRESSINGS) ×2 IMPLANT
STENT GRAFT BALLN CATH 65CM (STENTS) ×1
STOPCOCK MORSE 400PSI 3WAY (MISCELLANEOUS) ×2 IMPLANT
SUT ETHILON 3 0 PS 1 (SUTURE) IMPLANT
SUT PROLENE 5 0 C 1 24 (SUTURE) IMPLANT
SUT PROLENE 5 0 CC 1 (SUTURE) IMPLANT
SUT PROLENE 6 0 C 1 30 (SUTURE) IMPLANT
SUT VIC AB 2-0 CT1 27 (SUTURE)
SUT VIC AB 2-0 CT1 TAPERPNT 27 (SUTURE) IMPLANT
SUT VIC AB 3-0 SH 27 (SUTURE)
SUT VIC AB 3-0 SH 27X BRD (SUTURE) IMPLANT
SUT VICRYL 4-0 PS2 18IN ABS (SUTURE) ×4 IMPLANT
SYR 20CC LL (SYRINGE) ×4 IMPLANT
SYR 30ML LL (SYRINGE) IMPLANT
SYR 5ML LL (SYRINGE) IMPLANT
SYR MEDRAD MARK V 150ML (SYRINGE) ×2 IMPLANT
SYRINGE 10CC LL (SYRINGE) ×4 IMPLANT
TOWEL OR 17X24 6PK STRL BLUE (TOWEL DISPOSABLE) ×4 IMPLANT
TOWEL OR 17X26 10 PK STRL BLUE (TOWEL DISPOSABLE) ×4 IMPLANT
TRAY FOLEY CATH 14FRSI W/METER (CATHETERS) ×2 IMPLANT
TUBING HIGH PRESSURE 120CM (CONNECTOR) ×3 IMPLANT
WATER STERILE IRR 1000ML POUR (IV SOLUTION) ×1 IMPLANT
WIRE AMPLATZ SS-J .035X180CM (WIRE) ×2 IMPLANT
WIRE BENTSON .035X145CM (WIRE) ×3 IMPLANT

## 2011-11-03 NOTE — H&P (View-Only) (Signed)
Subjective:     Patient ID: [name] Juarez, male   DOB: [birth year], 66 y.o.   MRN: [id number]  HPI this 66 year old male returns today to for further discussion regarding his abdominal aortic aneurysm. CT angiogram has been reviewed and we do feel that he is a satisfactory candidate for aortic stent grafting. Cardiolite was performed and is a low risk study. He has no history of coronary artery disease. He has an excellent ejection fraction.  Past Medical History  Diagnosis Date  . Hyperlipidemia   . Varicose veins   . Erectile dysfunction   . Colon polyp 10/2006    History  Substance Use Topics  . Smoking status: Current Everyday Smoker -- 1.0 packs/day    Types: Cigarettes  . Smokeless tobacco: Never Used  . Alcohol Use: 12.6 oz/week    21 Cans of beer per week    History reviewed. No pertinent family history.  No Known Allergies  Current outpatient prescriptions:losartan (COZAAR) 25 MG tablet, Take 25 mg by mouth daily., Disp: , Rfl: ;  sildenafil (VIAGRA) 100 MG tablet, Take 100 mg by mouth as needed.  , Disp: , Rfl:   BP 137/74  Pulse 83  Resp 16  Ht 5\' 9"  (1.753 m)  Wt 170 lb (77.111 kg)  BMI 25.10 kg/m2  SpO2 99%  Body mass index is 25.10 kg/(m^2).           Review of Systems denies chest pain, dyspnea on exertion, PND, orthopnea, claudication, hemoptysis.     Objective:   Physical Exam blood pressure 137/74 heart rate 83 respirations 16 Gen.-alert and oriented x3 in no apparent distress HEENT normal for age Lungs no rhonchi or wheezing Cardiovascular regular rhythm no murmurs carotid pulses 3+ palpable no bruits audible Abdomen soft nontender -5 cm pulsatile mass in the midepigastrium Musculoskeletal free of  major deformities Skin clear -no rashes Neurologic normal Lower extremities 3+ femoral and dorsalis pedis pulses palpable bilaterally with no edema-varicose veins both legs from knee to ankle     Assessment:     5.3 cm infrarenal abdominal  aortic aneurysm No evidence of coronary artery disease-negative Cardiolite    Plan:     Plan aortic stent grafting of infrarenal abdominal aortic aneurysm using Gore aorto by common iliac graft-scheduled for Wednesday, July 31 Risks discussed with patient and wife and potential for open repair also discussed

## 2011-11-03 NOTE — Preoperative (Signed)
Beta Blockers   Reason not to administer Beta Blockers:Not Applicable 

## 2011-11-03 NOTE — Anesthesia Postprocedure Evaluation (Signed)
Anesthesia Post Note  Patient: [name] Juarez  Procedure(s) Performed: Procedure(s) (LRB): ABDOMINAL AORTIC ENDOVASCULAR STENT GRAFT (N/A)  Anesthesia type: general  Patient location: PACU  Post pain: Pain level controlled  Post assessment: Patient's Cardiovascular Status Stable  Last Vitals:  Filed Vitals:   11/03/11 1200  BP:   Pulse: 48  Temp:   Resp: 7    Post vital signs: Reviewed and stable  Level of consciousness: sedated  Complications: No apparent anesthesia complications

## 2011-11-03 NOTE — Anesthesia Preprocedure Evaluation (Addendum)
Anesthesia Evaluation  Patient identified by MRN, date of birth, ID band Patient awake    Reviewed: Allergy & Precautions, H&P , NPO status , Patient's Chart, lab work & pertinent test results, reviewed documented beta blocker date and time   Airway Mallampati: I TM Distance: <3 FB Neck ROM: Full    Dental  (+) Teeth Intact and Dental Advisory Given   Pulmonary neg pulmonary ROS,  breath sounds clear to auscultation        Cardiovascular hypertension, Rhythm:Regular Rate:Normal     Neuro/Psych Anxiety Depression negative neurological ROS     GI/Hepatic negative GI ROS, Neg liver ROS,   Endo/Other  negative endocrine ROS  Renal/GU negative Renal ROS     Musculoskeletal   Abdominal (+)  Abdomen: soft. Bowel sounds: normal.  Peds  Hematology negative hematology ROS (+)   Anesthesia Other Findings   Reproductive/Obstetrics                         Anesthesia Physical Anesthesia Plan  ASA: III  Anesthesia Plan: General   Post-op Pain Management:    Induction: Intravenous  Airway Management Planned: Oral ETT  Additional Equipment: Arterial line and Ultrasound Guidance Line Placement  Intra-op Plan:   Post-operative Plan: Extubation in OR  Informed Consent: I have reviewed the patients History and Physical, chart, labs and discussed the procedure including the risks, benefits and alternatives for the proposed anesthesia with the patient or authorized representative who has indicated his/her understanding and acceptance.   Dental advisory given  Plan Discussed with: CRNA and Surgeon  Anesthesia Plan Comments:        Anesthesia Quick Evaluation

## 2011-11-03 NOTE — Anesthesia Procedure Notes (Addendum)
Procedure Name: Intubation Date/Time: 11/03/2011 9:42 AM Performed by: Ellin Goodie Pre-anesthesia Checklist: Patient identified, Emergency Drugs available, Suction available, Patient being monitored and Timeout performed Patient Re-evaluated:Patient Re-evaluated prior to inductionOxygen Delivery Method: Circle system utilized Preoxygenation: Pre-oxygenation with 100% oxygen Intubation Type: IV induction Ventilation: Oral airway inserted - appropriate to patient size and Mask ventilation without difficulty Laryngoscope Size: Mac and 3 Grade View: Grade II Tube type: Oral Tube size: 7.5 mm Number of attempts: 1 Airway Equipment and Method: Stylet and Oral airway Placement Confirmation: ETT inserted through vocal cords under direct vision,  positive ETCO2,  CO2 detector and breath sounds checked- equal and bilateral Secured at: 22 cm Tube secured with: Tape Dental Injury: Teeth and Oropharynx as per pre-operative assessment    Performed by: Ellin Goodie

## 2011-11-03 NOTE — Interval H&P Note (Signed)
History and Physical Interval Note:  11/03/2011 8:11 AM  [name] Juarez  has presented today for surgery, with the diagnosis of Abdominal aortic aneurysm  The various methods of treatment have been discussed with the patient and family. After consideration of risks, benefits and other options for treatment, the patient has consented to  Procedure(s) (LRB): ABDOMINAL AORTIC ENDOVASCULAR STENT GRAFT (N/A) as a surgical intervention .  The patient's history has been reviewed, patient examined, no change in status, stable for surgery.  I have reviewed the patient's chart and labs.  Questions were answered to the patient's satisfaction.     Josephina Gip

## 2011-11-03 NOTE — Transfer of Care (Signed)
Immediate Anesthesia Transfer of Care Note  Patient: [name] Juarez  Procedure(s) Performed: Procedure(s) (LRB): ABDOMINAL AORTIC ENDOVASCULAR STENT GRAFT (N/A)  Patient Location: PACU  Anesthesia Type: General  Level of Consciousness: awake, alert  and oriented  Airway & Oxygen Therapy: Patient Spontanous Breathing and Patient connected to nasal cannula oxygen  Post-op Assessment: Report given to PACU RN, Post -op Vital signs reviewed and stable and Patient moving all extremities  Post vital signs: Reviewed and stable  Complications: No apparent anesthesia complications

## 2011-11-03 NOTE — Progress Notes (Signed)
Medications have been reviewed for renal function and no intervention is needed at this time.

## 2011-11-03 NOTE — Op Note (Signed)
OPERATIVE REPORT  Date of Surgery: 11/03/2011  Surgeon: Josephina Gip, MD  Assistant: Dr. Durene Cal  Pre-op Diagnosis: Abdominal aortic aneurysm  Post-op Diagnosis: Abdominal aortic aneurysm  Procedure: Procedure(s): #1 bilateral common femoral access via a percutaneous-pro-glyde deployment #2 insertion aorto bi common iliac-Gore-stent graft Anesthesia: General  EBL: 200 cc  Complications: None  Procedure Details:patient was taken to the operating room placed in supine position at which time satisfactory general endotracheal anesthesia was administered. The abdomen and groins were prepped with Betadine scrub and solution draped in routine sterile manner. Both common femoral arteries were entered via ultrasound guidance and 2 probe live suture devices were used on each femoral artery one at the 11:00 position one at the 1:00 position. These were used later for closure of the femoral artery. Bentson wires were passed proximally bilaterally and a short 8 French sheath was passed on the right and a 12 [place] long sheath passed on the left. Using a Kumpe catheter the wires were exchanged for Amplatz wires. A long 12 French sheath was then placed on the left-contralateral M. An 52 French sheath passed on the right. A 31 mm x 14 mm x 15 cm main body was deployed through the right side. Patient had been heparinized after inserting the initial sheaths. The device was positioned appropriately and using a pigtail catheter and injection of 10 cc of contrast at 10 cc per second was performed to localize the renal arteries. Graft was employed just below the renal arteries and this was confirmed with a second injection with excellent position. Graft was deployed down to the contralateral gate. Using the 12 French sheath and a Kumpe catheter the gate was cannulated without difficulty. A retrograde angiogram was performed through the sheath to make appropriate measures for the contralateral limb. A 14 mm x 10  cm graft was selected and deployed appropriately proximal to the left hypogastric. Following this the ipsilateral limb was completely deployed down into the common iliac artery. All junctions were dilated with a Q 50 balloon catheter. Completion angiogram was performed injecting 20 cc of contrast at 20 cc per second. This revealed the graft to be in excellent position with no evidence of any endoleak. Protamine was then given to reverse the heparin sheaths were removed bilaterally and the probe live sutures secured and deployed and no bleeding occurred. Both wounds were closed with simple 4-0 Vicryl subcuticular sutures patient was taken to the recovery room in stable condition.   Josephina Gip, MD 11/03/2011 10:41 AM

## 2011-11-04 ENCOUNTER — Other Ambulatory Visit: Payer: Self-pay | Admitting: Thoracic Diseases

## 2011-11-04 DIAGNOSIS — I714 Abdominal aortic aneurysm, without rupture: Secondary | ICD-10-CM

## 2011-11-04 LAB — BASIC METABOLIC PANEL
BUN: 10 mg/dL (ref 6–23)
CO2: 24 mEq/L (ref 19–32)
Calcium: 8.1 mg/dL — ABNORMAL LOW (ref 8.4–10.5)
Chloride: 105 mEq/L (ref 96–112)
Creatinine, Ser: 0.91 mg/dL (ref 0.50–1.35)
GFR calc Af Amer: >90 mL/min (ref >90)
GFR calc non Af Amer: 86 mL/min — ABNORMAL LOW (ref >90)
Glucose, Bld: 128 mg/dL — ABNORMAL HIGH (ref 70–99)
Potassium: 3.8 mEq/L (ref 3.5–5.1)
Sodium: 138 mEq/L (ref 135–145)

## 2011-11-04 LAB — CBC
HCT: 37.9 % — ABNORMAL LOW (ref 39.0–52.0)
Hemoglobin: 12.9 g/dL — ABNORMAL LOW (ref 13.0–17.0)
MCH: 34.6 pg — ABNORMAL HIGH (ref 26.0–34.0)
MCHC: 34 g/dL (ref 30.0–36.0)
MCV: 101.6 fL — ABNORMAL HIGH (ref 78.0–100.0)
Platelets: 197 10*3/uL (ref 150–400)
RBC: 3.73 MIL/uL — ABNORMAL LOW (ref 4.22–5.81)
RDW: 14 % (ref 11.5–15.5)
WBC: 9.5 10*3/uL (ref 4.0–10.5)

## 2011-11-04 MED ORDER — OXYCODONE HCL 5 MG PO TABS: 5.0000 mg | ORAL_TABLET | ORAL | Status: AC | PRN

## 2011-11-04 NOTE — Progress Notes (Signed)
[facility] [place]  Post-op EVAR Date of Surgery: 11/03/2011 Surgeon: Surgeon(s): Pryor Ochoa, MD Nada Libman, MD POD: 1 Day Post-Op Device: 31 mm x 14 mm x 15 cm main body 14 mm x 10 cm graft was selected and deployed appropriately proximal to the left hypogastric  Endoleak: No:   History of Present Illness  Nasire Reali is a 66 y.o. male who is s/p EVAR. The patient denies back pain; denies abdominal pain; denies lower extremity pain.  He is Ambulating and taking PO well without nausea or vomiting. Pt. has not voided with foley out  IMAGING: Dg Chest Portable 1 View  11/03/2011  *RADIOLOGY REPORT*  Clinical Data: Post AAA stent graft placement.  PORTABLE CHEST - 1 VIEW  Comparison: 11/01/2011  Findings: Right internal jugular vascular sheath in place with the tip in the upper SVC.  No pneumothorax.  Heart is normal size. Lungs are clear.  Small hiatal hernia noted.  No effusions or acute bony abnormality.  IMPRESSION: No pneumothorax or acute cardiopulmonary disease.  Small hiatal hernia.  Original Report Authenticated By: Cyndie Chime, M.D.   Dg Abd Portable 1v  11/03/2011  *RADIOLOGY REPORT*  Clinical Data: Post AAA stent placement.  PORTABLE ABDOMEN - 1 VIEW  Comparison: CT 09/28/2011  Findings: Changes of stent graft repair of abdominal aortic aneurysm.  Bilateral iliac limbs noted.  Normal bowel gas pattern. No supine evidence of free air.  No acute bony abnormality.  IMPRESSION: Stent graft for AAA repair.  Graft extends into the iliacs bilaterally.  Original Report Authenticated By: Cyndie Chime, M.D.    Significant Diagnostic Studies: CBC Lab Results  Component Value Date   WBC 9.5 11/04/2011   HGB 12.9* 11/04/2011   HCT 37.9* 11/04/2011   MCV 101.6* 11/04/2011   PLT 197 11/04/2011     BMET    Component Value Date/Time   NA 138 11/04/2011 0435   K 3.8 11/04/2011 0435   CL 105 11/04/2011 0435   CO2 24 11/04/2011 0435   GLUCOSE 128* 11/04/2011 0435   BUN 10 11/04/2011 0435   CREATININE 0.91 11/04/2011 0435   CREATININE 0.97 09/02/2011 0909   CALCIUM 8.1* 11/04/2011 0435   GFRNONAA 86* 11/04/2011 0435   GFRAA >90 11/04/2011 0435    COAG Lab Results  Component Value Date   INR 1.09 11/03/2011   INR 1.09 11/01/2011   No results found for this basename: PTT     I/O last 3 completed shifts: In: 3000 [I.V.:3000] Out: 2235 [Urine:2225; Blood:10] No data found.   Physical Examination  BP Readings from Last 3 Encounters:  11/04/11 113/63  11/04/11 113/63  11/01/11 132/80   Temp Readings from Last 3 Encounters:  11/04/11 98.1 F (36.7 C) Oral  11/04/11 98.1 F (36.7 C) Oral  11/01/11 98.4 F (36.9 C)    SpO2 Readings from Last 3 Encounters:  11/04/11 91%  11/04/11 91%  11/01/11 97%   Pulse Readings from Last 3 Encounters:  11/04/11 67  11/04/11 67  11/01/11 97    General: A&O x 3, WDWN male in NAD Gait: Normal Pulmonary: normal non-labored breathing  Cardiac: RRR Abdomen: soft, NT, NABS Bilateral groin wounds: clean, dry, intact, without hematoma  Extremities without ischemic changes, no Gangrene , no cellulitis; no open wounds;   Neurologic: A&O X 3; Appropriate Affect  Assessment: Christoper Bushey is a 66 y.o. male who is 1 Day Post-Op EVAR.  Pt is doing well with no complaints  Plan: Home  The importance of surveillance of the endograft was discussed with the patient  A CTA of abdomen and pelvis will be scheduled for one month to assess for endoleak.  The patient will follow up with [place] in one month with these studies.   SignedMarlowe Shores [phone] 11/04/2011 7:57 AM.

## 2011-11-04 NOTE — Discharge Summary (Signed)
[facility] Discharge Summary   Patient ID:  [name] Juarez MRN: [id number] DOB/AGE: May 10, 1945 66 y.o.  Admit date: 11/03/2011 Discharge date: 11/04/2011 Date of Surgery: 11/03/2011 Surgeon: Surgeon(s): Pryor Ochoa, MD Nada Libman, MD  Admission Diagnosis: Abdominal aortic aneurysm  Discharge Diagnoses:  Abdominal aortic aneurysm  Secondary Diagnoses: Past Medical History  Diagnosis Date  . Hyperlipidemia   . Varicose veins   . Colon polyp 10/2006  . Erectile dysfunction   . Hypertension   . Tuberculosis     age 58  . Arthritis     Procedure(s): ABDOMINAL AORTIC ENDOVASCULAR STENT GRAFT  Discharged Condition: good  HPI:  [name] Juarez is a 66 y.o. male who was seen for further discussion regarding his 5.3 cm infrarenal abdominal aortic aneurysm . CT angiogram has been reviewed and we do feel that he is a satisfactory candidate for aortic stent grafting. Cardiolite was performed and is a low risk study. He has no history of coronary artery disease. He has an excellent ejection fraction. He is admited for EVAR of AAA    Hospital Course:  [name] Juarez is a 67 y.o. male is S/P  Procedure(s): ABDOMINAL AORTIC ENDOVASCULAR STENT GRAFT Extubated: POD # 0 Post-op wounds healing well Pt. Ambulating, voiding and taking PO diet without difficulty. Pt pain controlled with PO pain meds. Labs as below Complications:none  Consults:     Significant Diagnostic Studies: CBC Lab Results  Component Value Date   WBC 9.5 11/04/2011   HGB 12.9* 11/04/2011   HCT 37.9* 11/04/2011   MCV 101.6* 11/04/2011   PLT 197 11/04/2011    BMET    Component Value Date/Time   NA 138 11/04/2011 0435   K 3.8 11/04/2011 0435   CL 105 11/04/2011 0435   CO2 24 11/04/2011 0435   GLUCOSE 128* 11/04/2011 0435   BUN 10 11/04/2011 0435   CREATININE 0.91 11/04/2011 0435   CREATININE 0.97 09/02/2011 0909   CALCIUM 8.1* 11/04/2011 0435   GFRNONAA 86* 11/04/2011 0435   GFRAA >90 11/04/2011 0435   COAG Lab  Results  Component Value Date   INR 1.09 11/03/2011   INR 1.09 11/01/2011     Disposition:  Discharge to :Home   [name], [name]  [facility] Medication Instructions [id number]   Printed on:11/04/11 0759  Medication Information                    sildenafil (VIAGRA) 100 MG tablet Take 100 mg by mouth daily as needed. For erectile dysfunction           losartan (COZAAR) 25 MG tablet Take 25 mg by mouth daily.           aspirin EC 81 MG tablet Take 81 mg by mouth daily.            Verbal and written Discharge instructions given to the patient. Wound care per Discharge AVS   Signed: Marlowe Shores 11/04/2011, 7:59 AM

## 2011-11-04 NOTE — Progress Notes (Signed)
Pt d/c home per MD order, pt d/c instructions and prescriptions given, pt VSS, family at [facility], pt and family verbalized understanding of instructions and d/c

## 2011-11-04 NOTE — Progress Notes (Signed)
Utilization review completed.  

## 2011-11-05 ENCOUNTER — Telehealth: Payer: Self-pay | Admitting: Vascular Surgery

## 2011-11-05 LAB — TYPE AND SCREEN
ABO/RH(D): A POS
Antibody Screen: NEGATIVE
Unit division: 0
Unit division: 0

## 2011-11-05 NOTE — Telephone Encounter (Signed)
Message copied by Fredrich Birks on Fri Nov 05, 2011 10:12 AM ------      Message from: [place], [place] K      Created: Thu Nov 04, 2011  2:52 PM      Regarding: schedule                    ----- Message -----         From: Melene Plan, RN         Sent: 11/04/2011   2:47 PM           To: Sharee Pimple, CMA, [facility]                        ----- Message -----         From: Marlowe Shores, PA         Sent: 11/04/2011  10:30 AM           To: Melene Plan, RN            1. Arlester Marker -  EVAR - [facility] [place] 4 weeks F/U will order CTA                  2. Rockey Situ [birth year] -right CEA 2 weeks - [place]

## 2011-11-05 NOTE — Telephone Encounter (Signed)
Spoke with patient regarding follow up with CTA. Mailed instruction letter also, dpm

## 2011-12-03 ENCOUNTER — Encounter: Payer: Self-pay | Admitting: Neurosurgery

## 2011-12-07 ENCOUNTER — Ambulatory Visit (INDEPENDENT_AMBULATORY_CARE_PROVIDER_SITE_OTHER): Payer: Medicare Other | Admitting: Vascular Surgery

## 2011-12-07 ENCOUNTER — Ambulatory Visit
Admission: RE | Admit: 2011-12-07 | Discharge: 2011-12-07 | Disposition: A | Discharge status: Home / Self Care | Payer: Medicare Other | Source: Ambulatory Visit | Attending: Thoracic Diseases | Admitting: Thoracic Diseases

## 2011-12-07 ENCOUNTER — Encounter: Payer: Self-pay | Admitting: Vascular Surgery

## 2011-12-07 VITALS — BP 119/67 | HR 71 | Temp 98.1°F | Ht 69.0 in | Wt 170.0 lb

## 2011-12-07 DIAGNOSIS — I714 Abdominal aortic aneurysm, without rupture: Secondary | ICD-10-CM

## 2011-12-07 DIAGNOSIS — Z48812 Encounter for surgical aftercare following surgery on the circulatory system: Secondary | ICD-10-CM

## 2011-12-07 MED ORDER — IOHEXOL 350 MG/ML SOLN
100.0000 mL | Freq: Once | INTRAVENOUS | Status: AC | PRN
  Administered 2011-12-07: 100 mL via INTRAVENOUS

## 2011-12-07 NOTE — Progress Notes (Signed)
Subjective:     Patient ID: [name] Juarez, male   DOB: [birth year], 66 y.o.   MRN: [id number]  HPI this 66 year old male is 5 weeks post insertion of the aorta, common iliac stent graft for abdominal aortic aneurysm. He has done very well postop. This was done using percutaneous approach. He denies any abdominal or back symptoms. He has no claudication. He has no chest pain or shortness of breath.   Review of Systems     Objective:   Physical ExamBP 119/67  Pulse 71  Temp 98.1 F (36.7 C) (Oral)  Ht 5\' 9"  (1.753 m)  Wt 170 lb (77.111 kg)  BMI 25.10 kg/m2  SpO2 97%  General well-developed well-nourished male no apparent distress alert and oriented x3 Lungs no rhonchi or wheezing Cardiovascular regular rhythm no murmurs Abdomen soft nontender no pulsatile mass noted 3+ femoral pulses bilaterally no evidence of pseudoaneurysm  Today I ordered a CT angiogram which I reviewed by computer. There is no evidence of endoleak. The aneurysm sac is approximately 5 cm in diameter with no evidence of migration of the graft.     Assessment:     Doing well 5 weeks post EVA R. with aorto by common iliac graft-goal-Excluder    Plan:     Return in 6 months with repeat CT angiogram to follow stent graft

## 2011-12-07 NOTE — Addendum Note (Signed)
Addended by: Sharee Pimple on: 12/07/2011 01:14 PM   Modules accepted: Orders

## 2012-06-05 ENCOUNTER — Encounter: Payer: Self-pay | Admitting: Vascular Surgery

## 2012-06-06 ENCOUNTER — Ambulatory Visit
Admission: RE | Admit: 2012-06-06 | Discharge: 2012-06-06 | Disposition: A | Discharge status: Home / Self Care | Payer: Medicare Other | Source: Ambulatory Visit | Attending: Vascular Surgery | Admitting: Vascular Surgery

## 2012-06-06 ENCOUNTER — Ambulatory Visit (INDEPENDENT_AMBULATORY_CARE_PROVIDER_SITE_OTHER): Payer: Medicare Other | Admitting: Vascular Surgery

## 2012-06-06 ENCOUNTER — Encounter: Payer: Self-pay | Admitting: Vascular Surgery

## 2012-06-06 VITALS — BP 136/78 | HR 73 | Ht 69.0 in | Wt 174.9 lb

## 2012-06-06 DIAGNOSIS — Z48812 Encounter for surgical aftercare following surgery on the circulatory system: Secondary | ICD-10-CM

## 2012-06-06 DIAGNOSIS — I714 Abdominal aortic aneurysm, without rupture, unspecified: Secondary | ICD-10-CM

## 2012-06-06 MED ORDER — IOHEXOL 350 MG/ML SOLN
80.0000 mL | Freq: Once | INTRAVENOUS | Status: AC | PRN
  Administered 2012-06-06: 80 mL via INTRAVENOUS

## 2012-06-06 NOTE — Progress Notes (Signed)
Subjective:     Patient ID: [name] Juarez, male   DOB: [birth year], 67 y.o.   MRN: [id number]  HPI this 67 year old male returns 6 months post insertion of aortic stent graft for abdominal aortic aneurysm-Gore C-3. He has done well and denies any abdominal or back symptoms. His appetite is good and he has resumed his normal activity level.  Past Medical History  Diagnosis Date  . Hyperlipidemia   . Varicose veins   . Colon polyp 10/2006  . Erectile dysfunction   . Hypertension   . Tuberculosis     age 83  . Arthritis     History  Substance Use Topics  . Smoking status: Former Smoker -- 45 years    Types: Cigarettes    Quit date: 11/05/2011  . Smokeless tobacco: Never Used  . Alcohol Use: 15.0 oz/week    21 Cans of beer, 4 Glasses of wine per week    No family history on file.  No Known Allergies  Current outpatient prescriptions:aspirin EC 81 MG tablet, Take 81 mg by mouth daily., Disp: , Rfl: ;  losartan (COZAAR) 25 MG tablet, Take 25 mg by mouth daily., Disp: , Rfl: ;  sildenafil (VIAGRA) 100 MG tablet, Take 100 mg by mouth daily as needed. For erectile dysfunction, Disp: , Rfl:   BP 136/78  Pulse 73  Ht 5\' 9"  (1.753 m)  Wt 174 lb 14.4 oz (79.334 kg)  BMI 25.82 kg/m2  SpO2 100%  Body mass index is 25.82 kg/(m^2).           Review of Systems Denies chest pain, dyspnea on exertion, PND, orthopnea, hemoptysis, or claudication.      Objective:   Physical Examblood pressure 136/78 heart rate 73 respirations 18 Gen.-alert and oriented x3 in no apparent distress HEENT normal for age Lungs no rhonchi or wheezing Cardiovascular regular rhythm no murmurs carotid pulses 3+ palpable no bruits audible Abdomen soft nontender no palpable masses Musculoskeletal free of  major deformities Skin clear -no rashes Neurologic normal Lower extremities 3+ femoral and2+ dorsalis pedis pulses palpable bilaterally with no edema Multiple reticular veins lower third of both lower  extremities no active ulceration.  Today I ordered a CT angiogram which are reviewed and interpreted. Stent graft is in excellent position with no evidence of endoleak. Aneurysm sac is decreased from 4.9-4.6 cm in maximum diameter       Assessment:     Nicely functioning aortic stent graft placed 6 months ago for infrarenal abdominal aortic aneurysm with decreased sac size from 4.9 to  4.6 cm-no evidence endoleak     Plan:     Return in 6 months for duplex scan of stent graft and abdominal aorta in our office

## 2012-06-07 NOTE — Addendum Note (Signed)
Addended by: MCCHESNEY, MARILYN K on: 06/07/2012 11:27 AM   Modules accepted: Orders  

## 2012-12-11 ENCOUNTER — Encounter: Payer: Self-pay | Admitting: Vascular Surgery

## 2012-12-12 ENCOUNTER — Ambulatory Visit (INDEPENDENT_AMBULATORY_CARE_PROVIDER_SITE_OTHER): Payer: Medicare Other | Admitting: Vascular Surgery

## 2012-12-12 ENCOUNTER — Encounter (INDEPENDENT_AMBULATORY_CARE_PROVIDER_SITE_OTHER): Payer: Medicare Other | Admitting: Vascular Surgery

## 2012-12-12 ENCOUNTER — Encounter: Payer: Self-pay | Admitting: Vascular Surgery

## 2012-12-12 VITALS — BP 140/75 | HR 70 | Ht 69.0 in | Wt 168.0 lb

## 2012-12-12 DIAGNOSIS — I714 Abdominal aortic aneurysm, without rupture, unspecified: Secondary | ICD-10-CM

## 2012-12-12 DIAGNOSIS — Z48812 Encounter for surgical aftercare following surgery on the circulatory system: Secondary | ICD-10-CM

## 2012-12-12 DIAGNOSIS — I83893 Varicose veins of bilateral lower extremities with other complications: Secondary | ICD-10-CM

## 2012-12-12 NOTE — Progress Notes (Signed)
Subjective:     Patient ID: [name] Juarez, male   DOB: 1945/12/10, 67 y.o.   MRN: [id number]  HPI this 67 year old male returned for continued followup regarding his aortic stent graft repair of an abdominal aortic aneurysm performed one year ago. He has had no abdominal or back symptoms. He does report that he has had some pain and a "knot" in his right lower leg. He has a history of laser ablation of the right great saphenous vein perform on the bowel one year ago for distal edema and bulging varicosities. There was incomplete closure of the right great saphenous vein following the procedure but he had no distal edema and his symptoms were much improved and no further procedures were performed. He states now he is having aching throbbing and burning discomfort in the right leg particularly in these bulges which have gotten larger in the right calf. There are 2 that became very firm over the past few weeks.  Past Medical History  Diagnosis Date  . Hyperlipidemia   . Varicose veins   . Colon polyp 10/2006  . Erectile dysfunction   . Hypertension   . Tuberculosis     age 67  . Arthritis     History  Substance Use Topics  . Smoking status: Former Smoker -- 45 years    Types: Cigarettes    Quit date: 11/05/2011  . Smokeless tobacco: Never Used  . Alcohol Use: 15.0 oz/week    21 Cans of beer, 4 Glasses of wine per week    History reviewed. No pertinent family history.  No Known Allergies  Current outpatient prescriptions:aspirin EC 81 MG tablet, Take 81 mg by mouth daily., Disp: , Rfl: ;  losartan (COZAAR) 25 MG tablet, Take 25 mg by mouth daily., Disp: , Rfl: ;  sildenafil (VIAGRA) 100 MG tablet, Take 100 mg by mouth daily as needed. For erectile dysfunction, Disp: , Rfl:   BP 140/75  Pulse 70  Ht 5\' 9"  (1.753 m)  Wt 168 lb (76.204 kg)  BMI 24.8 kg/m2  SpO2 98%  Body mass index is 24.8 kg/(m^2).          Review of Systems denies chest pain, dyspnea on exertion, PND,  orthopnea, hemoptysis, lateralizing weakness. All systems negative and complete review of systems     Objective:   Physical Exam BP 140/75  Pulse 70  Ht 5\' 9"  (1.753 m)  Wt 168 lb (76.204 kg)  BMI 24.8 kg/m2  SpO2 98%  Gen.-alert and oriented x3 in no apparent distress HEENT normal for age Lungs no rhonchi or wheezing Cardiovascular regular rhythm no murmurs carotid pulses 3+ palpable no bruits audible Abdomen soft nontender no palpable masses Musculoskeletal free of  major deformities Skin clear -no rashes Neurologic normal Lower extremities 3+ femoral and dorsalis pedis pulses palpable bilaterally with no edema Right leg with bulging varicosities in the great saphenous system beginning at the knee extending to the ankle with chronic hyperpigmentation but no edema. He does have to thrombosed varicosities in the proximal medial calf each about 2 cm in diameter with no inflammation. There is no tenderness above the knee.  Today I ordered duplex scan of his abdominal aortic aneurysm stent graft repair. This reveals continued contraction of the aneurysm down to 4.35 in maximum diameter with no evidence of endoleak. I also imaged the right lower extremity great saphenous vein with the SonoSite and there is gross reflux throughout from the saphenofemoral junction to the knee just above where the  thrombophlebitis is located. This represents total recanalization of the right great saphenous vein        Assessment:     #1 doing well post aortic stent graft which continued contraction of size of aneurysm sac #2 recanalization right great saphenous vein one year post laser ablation right great saphenous vein for painful varicosities with recurrent varicosities and pain and recent episode of thrombophlebitis    Plan:     #1 return in 3 months with venous duplex exam and lab to confirm recanalization right great saphenous vein. Patient will begin conservative measures including 20/30 long leg  compression stockings, elevation, and ibuprofen #2 return in one year with CT angiogram of abdomen and pelvis to continue to follow aortic stent graft #3 if no improvement in venous situation he wanted laser ablation right great saphenous vein in return in 3 months with possible stab phlebectomy

## 2012-12-14 ENCOUNTER — Other Ambulatory Visit: Payer: Self-pay | Admitting: *Deleted

## 2012-12-14 DIAGNOSIS — I714 Abdominal aortic aneurysm, without rupture: Secondary | ICD-10-CM

## 2012-12-14 DIAGNOSIS — I83893 Varicose veins of bilateral lower extremities with other complications: Secondary | ICD-10-CM

## 2012-12-14 DIAGNOSIS — Z48812 Encounter for surgical aftercare following surgery on the circulatory system: Secondary | ICD-10-CM

## 2013-01-24 ENCOUNTER — Other Ambulatory Visit: Payer: Self-pay | Admitting: *Deleted

## 2013-01-24 DIAGNOSIS — Z48812 Encounter for surgical aftercare following surgery on the circulatory system: Secondary | ICD-10-CM

## 2013-01-24 DIAGNOSIS — I714 Abdominal aortic aneurysm, without rupture: Secondary | ICD-10-CM

## 2013-03-12 ENCOUNTER — Encounter: Payer: Self-pay | Admitting: Vascular Surgery

## 2013-03-13 ENCOUNTER — Ambulatory Visit (INDEPENDENT_AMBULATORY_CARE_PROVIDER_SITE_OTHER): Payer: Medicare Other | Admitting: Vascular Surgery

## 2013-03-13 ENCOUNTER — Ambulatory Visit (HOSPITAL_COMMUNITY)
Admission: RE | Admit: 2013-03-13 | Discharge: 2013-03-13 | Disposition: A | Discharge status: Home / Self Care | Payer: Medicare Other | Source: Ambulatory Visit | Attending: Vascular Surgery | Admitting: Vascular Surgery

## 2013-03-13 ENCOUNTER — Encounter (HOSPITAL_COMMUNITY): Payer: Medicare Other

## 2013-03-13 ENCOUNTER — Other Ambulatory Visit (HOSPITAL_COMMUNITY): Payer: Medicare Other

## 2013-03-13 ENCOUNTER — Encounter: Payer: Self-pay | Admitting: Vascular Surgery

## 2013-03-13 VITALS — BP 135/72 | HR 70 | Resp 16 | Ht 69.0 in | Wt 170.0 lb

## 2013-03-13 DIAGNOSIS — I83893 Varicose veins of bilateral lower extremities with other complications: Secondary | ICD-10-CM

## 2013-03-13 NOTE — Progress Notes (Signed)
Subjective:     Patient ID: [name] Juarez, male   DOB: Jan 22, 1946, 67 y.o.   MRN: [id number]  HPI this 67 year old male returns for continued followup regarding his recurrent varicose veins in the right leg. He had laser ablation right great saphenous vein the past but when seen 3 months ago it appeared that he had recanalization of the entire right great saphenous vein from the knee to the saphenofemoral junction. He had developed an episode of acute thrombophlebitis in the medial calf which led to this diagnosis. Today return for formal venous duplex exam in our office. He has been wearing long light elastic compression stockings 20-30 mm gradient as well as trying elevation and ibuprofen with no improvement in his symptoms. He has aching throbbing and burning discomfort. He has had no recurrent ulcerations.  Past Medical History  Diagnosis Date  . Hyperlipidemia   . Varicose veins   . Colon polyp 10/2006  . Erectile dysfunction   . Hypertension   . Tuberculosis     age 67  . Arthritis     History  Substance Use Topics  . Smoking status: Former Smoker -- 45 years    Types: Cigarettes    Quit date: 11/05/2011  . Smokeless tobacco: Never Used  . Alcohol Use: 15.0 oz/week    21 Cans of beer, 4 Glasses of wine per week    History reviewed. No pertinent family history.  No Known Allergies  Current outpatient prescriptions:aspirin EC 81 MG tablet, Take 81 mg by mouth daily., Disp: , Rfl: ;  losartan (COZAAR) 25 MG tablet, Take 25 mg by mouth daily., Disp: , Rfl: ;  sildenafil (VIAGRA) 100 MG tablet, Take 100 mg by mouth daily as needed. For erectile dysfunction, Disp: , Rfl:   BP 135/72  Pulse 70  Resp 16  Ht 5\' 9"  (1.753 m)  Wt 170 lb (77.111 kg)  BMI 25.09 kg/m2  Body mass index is 25.09 kg/(m^2).           Review of Systems denies chest pain, dyspnea on exertion, PND, orthopnea.    Objective:   Physical Exam BP 135/72  Pulse 70  Resp 16  Ht 5\' 9"  (1.753 m)  Wt 170  lb (77.111 kg)  BMI 25.09 kg/m2 General well-developed well-nourished male no apparent stress alert and oriented x3 Lungs no rhonchi or wheezing Abdomen no pulsatile mass noted Right leg with bulging varicosities of the great saphenous system beginning in the distal thigh extending into the medial calf. There is evidence of resolving thrombophlebitis in 1 varix in the mid calf area. He has severe hyperpigmentation lower third right leg with evidence of old healed ulcerations. 2+ dorsalis pedis pulse palpable.  Today I ordered venous duplex exam of the right leg which are viewed and interpreted. His reviews total recanalization of the right great saphenous vein from the knee to the saphenofemoral junction with gross reflux supplying these bulging varicosities no DVT     Assessment:     Gross reflux right great saphenous vein with recurrent varicosities causing pain and resolving thrombophlebitis in the varix right mid calf. Symptoms not responding to conservative measures including long-leg elastic compression stockings, ibuprofen, and elevation. Definitely affecting patient's daily living    Plan:     Patient needs a laser ablation right great saphenous vein with return in 3 months to see if stab phlebectomy will be indicated

## 2013-04-17 ENCOUNTER — Other Ambulatory Visit: Payer: Self-pay | Admitting: *Deleted

## 2013-04-17 DIAGNOSIS — I83893 Varicose veins of bilateral lower extremities with other complications: Secondary | ICD-10-CM

## 2013-04-20 ENCOUNTER — Encounter: Payer: Self-pay | Admitting: Vascular Surgery

## 2013-04-23 ENCOUNTER — Encounter (INDEPENDENT_AMBULATORY_CARE_PROVIDER_SITE_OTHER): Payer: Self-pay

## 2013-04-23 ENCOUNTER — Ambulatory Visit (INDEPENDENT_AMBULATORY_CARE_PROVIDER_SITE_OTHER): Payer: Medicare Other | Admitting: Vascular Surgery

## 2013-04-23 ENCOUNTER — Encounter: Payer: Self-pay | Admitting: Vascular Surgery

## 2013-04-23 VITALS — BP 149/90 | HR 78 | Resp 16 | Ht 69.0 in | Wt 170.0 lb

## 2013-04-23 DIAGNOSIS — I83893 Varicose veins of bilateral lower extremities with other complications: Secondary | ICD-10-CM

## 2013-04-23 NOTE — Progress Notes (Signed)
   Laser Ablation Procedure      Date: 04/23/2013    [name] OrleansAlan Rison DOB:01/26/1946  Consent signed: Yes  Surgeon:J.D. Hart RochesterLawson  Procedure: Laser Ablation: right Greater Saphenous Vein  BP 149/90  Pulse 78  Resp 16  Ht 5\' 9"  (1.753 m)  Wt 170 lb (77.111 kg)  BMI 25.09 kg/m2  Start time: 9:10   End time: 9:50  Tumescent Anesthesia: 425 cc 0.9% NaCl with 50 cc Lidocaine HCL with 1% Epi and 15 cc 8.4% NaHCO3  Local Anesthesia: 5 cc Lidocaine HCL and NaHCO3 (ratio 2:1)  Pulsed mode: 17 watts, 500ms delay. 1.0 duration Total energy: 2731, total pulses: 161, total time: 2:41     Patient tolerated procedure well: Yes  Notes:   Description of Procedure:  After marking the course of the saphenous vein and the secondary varicosities in the standing position, the patient was placed on the operating table in the supine position, and the right leg was prepped and draped in sterile fashion. Local anesthetic was administered, and under ultrasound guidance the saphenous vein was accessed with a micro needle and guide wire; then the micro puncture sheath was placed. A guide wire was inserted to the saphenofemoral junction, followed by a 5 french sheath.  The position of the sheath and then the laser fiber below the junction was confirmed using the ultrasound and visualization of the aiming beam.  Tumescent anesthesia was administered along the course of the saphenous vein using ultrasound guidance. Protective laser glasses were placed on the patient, and the laser was fired at 17 watts pulsed mode.  For a total of 2731 joules.  A steri strip was applied to the puncture site.    ABD pads and thigh high compression stockings were applied.  Ace wrap bandages were applied over the phlebectomy sites and at the top of the saphenofemoral junction.  Blood loss was less than 15 cc.  The patient ambulated out of the operating room having tolerated the procedure well.

## 2013-04-23 NOTE — Progress Notes (Signed)
Subjective:     Patient ID: [name] Juarez, male   DOB: 01/11/1946, 68 y.o.   MRN: [id number]  HPI is 68 year old male had laser ablation of the right great saphenous vein from the proximal calf to the saphenofemoral junction performed under local tumescent anesthesia. He tolerated the procedure well. A total of 2700 J of energy was utilized.  Review of Systems     Objective:   Physical Exam BP 149/90  Pulse 78  Resp 16  Ht 5\' 9"  (1.753 m)  Wt 170 lb (77.111 kg)  BMI 25.09 kg/m2        Assessment:     Well-tolerated laser ablation right great saphenous vein performed under local tumescent anesthesia for venous hypertension and recurrent varicosities-previous laser ablation performed on right great saphenous vein in the past with recanalization    Plan:     Return in one week for venous duplex exam to confirm closure right great saphenous vein   then return in 3 months to see if stab phlebectomy of secondary varicosities will be indicated

## 2013-04-24 ENCOUNTER — Telehealth: Payer: Self-pay | Admitting: *Deleted

## 2013-04-24 ENCOUNTER — Encounter: Payer: Self-pay | Admitting: Vascular Surgery

## 2013-04-24 NOTE — Telephone Encounter (Signed)
Patient doing very well. Says he is not having any pain. Following all instructions.

## 2013-04-27 ENCOUNTER — Encounter: Payer: Self-pay | Admitting: Vascular Surgery

## 2013-04-30 ENCOUNTER — Ambulatory Visit (HOSPITAL_COMMUNITY)
Admission: RE | Admit: 2013-04-30 | Discharge: 2013-04-30 | Disposition: A | Discharge status: Home / Self Care | Payer: Medicare Other | Source: Ambulatory Visit | Attending: Vascular Surgery | Admitting: Vascular Surgery

## 2013-04-30 ENCOUNTER — Ambulatory Visit (INDEPENDENT_AMBULATORY_CARE_PROVIDER_SITE_OTHER): Payer: Medicare Other | Admitting: Vascular Surgery

## 2013-04-30 ENCOUNTER — Encounter: Payer: Self-pay | Admitting: Vascular Surgery

## 2013-04-30 VITALS — BP 162/90 | HR 68 | Resp 18 | Ht 69.0 in | Wt 173.0 lb

## 2013-04-30 DIAGNOSIS — I83893 Varicose veins of bilateral lower extremities with other complications: Secondary | ICD-10-CM

## 2013-04-30 NOTE — Progress Notes (Signed)
Subjective:     Patient ID: [name] Juarez, male   DOB: 09/15/1945, 68 y.o.   MRN: [id number]  HPI this 68 year old male had laser ablation of the right great saphenous vein performed one week ago. The vein had been previously treated and had recanalized. We used extra energy during the ablation procedure. He denies any unusual discomfort. He has had no chest pain, dyspnea on exertion, PND, orthopnea, or hemoptysis. He has been wearing elastic compression stockings and has had no distal edema.  Past Medical History  Diagnosis Date  . Hyperlipidemia   . Varicose veins   . Colon polyp 10/2006  . Erectile dysfunction   . Hypertension   . Tuberculosis     age 89  . Arthritis     History  Substance Use Topics  . Smoking status: Former Smoker -- 45 years    Types: Cigarettes    Quit date: 11/05/2011  . Smokeless tobacco: Never Used  . Alcohol Use: 15.0 oz/week    21 Cans of beer, 4 Glasses of wine per week    No family history on file.  No Known Allergies  Current outpatient prescriptions:aspirin EC 81 MG tablet, Take 81 mg by mouth daily., Disp: , Rfl: ;  losartan (COZAAR) 25 MG tablet, Take 25 mg by mouth daily., Disp: , Rfl: ;  sildenafil (VIAGRA) 100 MG tablet, Take 100 mg by mouth daily as needed. For erectile dysfunction, Disp: , Rfl:   BP 162/90  Pulse 68  Resp 18  Ht 5\' 9"  (1.753 m)  Wt 173 lb (78.472 kg)  BMI 25.54 kg/m2  Body mass index is 25.54 kg/(m^2).           Review of Systems     Objective:   Physical Exam BP 162/90  Pulse 68  Resp 18  Ht 5\' 9"  (1.753 m)  Wt 173 lb (78.472 kg)  BMI 25.54 kg/m2  General well-developed well-nourished male in no apparent stress alert oriented x3 Lungs no rhonchi or wheezing Right lower stranding with mild tenderness along the course of the great saphenous vein from the saphenofemoral junction to the proximal calf. There is mild to moderate ecchymosis but no hematoma. No distal edema noted. 3 posterior cells pedis pulse  palpable.  To that order a venous duplex exam of the right leg which are reviewed and interpreted. The great saphenous vein is totally closed from the proximal calf to near the saphenofemoral junction and there is no DVT     Assessment:     Successful laser ablation right great saphenous vein which has recanalized in the past    Plan:     Patient return to see us on when necessary basis

## 2013-08-02 DIAGNOSIS — K35209 Acute appendicitis with generalized peritonitis, without abscess, unspecified as to perforation: Principal | ICD-10-CM | POA: Diagnosis present

## 2013-08-02 DIAGNOSIS — I10 Essential (primary) hypertension: Secondary | ICD-10-CM | POA: Diagnosis present

## 2013-08-02 DIAGNOSIS — Z7982 Long term (current) use of aspirin: Secondary | ICD-10-CM

## 2013-08-02 DIAGNOSIS — N289 Disorder of kidney and ureter, unspecified: Secondary | ICD-10-CM | POA: Diagnosis not present

## 2013-08-02 DIAGNOSIS — K560 Paralytic ileus: Secondary | ICD-10-CM | POA: Diagnosis not present

## 2013-08-02 DIAGNOSIS — Z8611 Personal history of tuberculosis: Secondary | ICD-10-CM

## 2013-08-02 DIAGNOSIS — Z860100 Personal history of colon polyps, unspecified: Secondary | ICD-10-CM

## 2013-08-02 DIAGNOSIS — Z87891 Personal history of nicotine dependence: Secondary | ICD-10-CM

## 2013-08-02 DIAGNOSIS — Y836 Removal of other organ (partial) (total) as the cause of abnormal reaction of the patient, or of later complication, without mention of misadventure at the time of the procedure: Secondary | ICD-10-CM | POA: Diagnosis not present

## 2013-08-02 DIAGNOSIS — N529 Male erectile dysfunction, unspecified: Secondary | ICD-10-CM | POA: Diagnosis present

## 2013-08-02 DIAGNOSIS — K929 Disease of digestive system, unspecified: Secondary | ICD-10-CM | POA: Diagnosis not present

## 2013-08-02 DIAGNOSIS — E785 Hyperlipidemia, unspecified: Secondary | ICD-10-CM | POA: Diagnosis present

## 2013-08-02 DIAGNOSIS — M129 Arthropathy, unspecified: Secondary | ICD-10-CM | POA: Diagnosis present

## 2013-08-02 DIAGNOSIS — Z79899 Other long term (current) drug therapy: Secondary | ICD-10-CM

## 2013-08-02 DIAGNOSIS — I8390 Asymptomatic varicose veins of unspecified lower extremity: Secondary | ICD-10-CM | POA: Diagnosis present

## 2013-08-02 DIAGNOSIS — A419 Sepsis, unspecified organism: Secondary | ICD-10-CM | POA: Diagnosis present

## 2013-08-02 DIAGNOSIS — K352 Acute appendicitis with generalized peritonitis, without abscess: Principal | ICD-10-CM | POA: Diagnosis present

## 2013-08-03 ENCOUNTER — Inpatient Hospital Stay (HOSPITAL_COMMUNITY)
Admission: EM | Admit: 2013-08-03 | Discharge: 2013-08-08 | DRG: 338 | Disposition: A | Discharge status: Home / Self Care | Payer: Medicare Other | Attending: Surgery | Admitting: Surgery

## 2013-08-03 ENCOUNTER — Encounter (HOSPITAL_COMMUNITY): Payer: Self-pay | Admitting: Emergency Medicine

## 2013-08-03 ENCOUNTER — Inpatient Hospital Stay (HOSPITAL_COMMUNITY): Payer: Medicare Other

## 2013-08-03 ENCOUNTER — Encounter (HOSPITAL_COMMUNITY): Payer: Medicare Other | Admitting: Anesthesiology

## 2013-08-03 ENCOUNTER — Emergency Department (HOSPITAL_COMMUNITY): Payer: Medicare Other

## 2013-08-03 ENCOUNTER — Inpatient Hospital Stay (HOSPITAL_COMMUNITY): Payer: Medicare Other | Admitting: Anesthesiology

## 2013-08-03 ENCOUNTER — Encounter (HOSPITAL_COMMUNITY): Admission: EM | Disposition: A | Discharge status: Home / Self Care | Payer: Self-pay | Source: Home / Self Care

## 2013-08-03 DIAGNOSIS — K3580 Unspecified acute appendicitis: Secondary | ICD-10-CM

## 2013-08-03 DIAGNOSIS — I10 Essential (primary) hypertension: Secondary | ICD-10-CM | POA: Diagnosis present

## 2013-08-03 DIAGNOSIS — K352 Acute appendicitis with generalized peritonitis, without abscess: Secondary | ICD-10-CM

## 2013-08-03 DIAGNOSIS — K35209 Acute appendicitis with generalized peritonitis, without abscess, unspecified as to perforation: Principal | ICD-10-CM | POA: Diagnosis present

## 2013-08-03 DIAGNOSIS — K37 Unspecified appendicitis: Secondary | ICD-10-CM

## 2013-08-03 DIAGNOSIS — N179 Acute kidney failure, unspecified: Secondary | ICD-10-CM

## 2013-08-03 HISTORY — DX: Acute appendicitis with generalized peritonitis, without abscess: K35.20

## 2013-08-03 HISTORY — PX: LAPAROSCOPIC APPENDECTOMY: SHX408

## 2013-08-03 HISTORY — DX: Acute appendicitis with generalized peritonitis, without abscess, unspecified as to perforation: K35.209

## 2013-08-03 LAB — CBC
HCT: 40.6 % (ref 39.0–52.0)
Hemoglobin: 13.8 g/dL (ref 13.0–17.0)
MCH: 32.5 pg (ref 26.0–34.0)
MCHC: 34 g/dL (ref 30.0–36.0)
MCV: 95.8 fL (ref 78.0–100.0)
Platelets: 172 10*3/uL (ref 150–400)
RBC: 4.24 MIL/uL (ref 4.22–5.81)
RDW: 13.4 % (ref 11.5–15.5)
WBC: 5.9 10*3/uL (ref 4.0–10.5)

## 2013-08-03 LAB — COMPREHENSIVE METABOLIC PANEL
ALT: 13 U/L (ref 0–53)
AST: 15 U/L (ref 0–37)
Albumin: 3.8 g/dL (ref 3.5–5.2)
Alkaline Phosphatase: 37 U/L — ABNORMAL LOW (ref 39–117)
BUN: 27 mg/dL — ABNORMAL HIGH (ref 6–23)
CO2: 21 mEq/L (ref 19–32)
Calcium: 9.3 mg/dL (ref 8.4–10.5)
Chloride: 98 mEq/L (ref 96–112)
Creatinine, Ser: 1.46 mg/dL — ABNORMAL HIGH (ref 0.50–1.35)
GFR calc Af Amer: 55 mL/min — ABNORMAL LOW (ref >90)
GFR calc non Af Amer: 48 mL/min — ABNORMAL LOW (ref >90)
Glucose, Bld: 161 mg/dL — ABNORMAL HIGH (ref 70–99)
Potassium: 4.3 mEq/L (ref 3.7–5.3)
Sodium: 136 mEq/L — ABNORMAL LOW (ref 137–147)
Total Bilirubin: 1.2 mg/dL (ref 0.3–1.2)
Total Protein: 7 g/dL (ref 6.0–8.3)

## 2013-08-03 LAB — CBC WITH DIFFERENTIAL/PLATELET
Basophils Absolute: 0 10*3/uL (ref 0.0–0.1)
Basophils Relative: 0 % (ref 0–1)
Eosinophils Absolute: 0 10*3/uL (ref 0.0–0.7)
Eosinophils Relative: 0 % (ref 0–5)
HCT: 45.9 % (ref 39.0–52.0)
Hemoglobin: 15.9 g/dL (ref 13.0–17.0)
Lymphocytes Relative: 11 % — ABNORMAL LOW (ref 12–46)
Lymphs Abs: 0.6 10*3/uL — ABNORMAL LOW (ref 0.7–4.0)
MCH: 33.2 pg (ref 26.0–34.0)
MCHC: 34.6 g/dL (ref 30.0–36.0)
MCV: 95.8 fL (ref 78.0–100.0)
Monocytes Absolute: 0.3 10*3/uL (ref 0.1–1.0)
Monocytes Relative: 6 % (ref 3–12)
Neutro Abs: 4.5 10*3/uL (ref 1.7–7.7)
Neutrophils Relative %: 83 % — ABNORMAL HIGH (ref 43–77)
Platelets: 186 10*3/uL (ref 150–400)
RBC: 4.79 MIL/uL (ref 4.22–5.81)
RDW: 13.2 % (ref 11.5–15.5)
WBC: 5.5 10*3/uL (ref 4.0–10.5)

## 2013-08-03 LAB — BASIC METABOLIC PANEL
BUN: 27 mg/dL — ABNORMAL HIGH (ref 6–23)
CO2: 20 mEq/L (ref 19–32)
Calcium: 7.9 mg/dL — ABNORMAL LOW (ref 8.4–10.5)
Chloride: 102 mEq/L (ref 96–112)
Creatinine, Ser: 1.35 mg/dL (ref 0.50–1.35)
GFR calc Af Amer: 61 mL/min — ABNORMAL LOW (ref >90)
GFR calc non Af Amer: 52 mL/min — ABNORMAL LOW (ref >90)
Glucose, Bld: 120 mg/dL — ABNORMAL HIGH (ref 70–99)
Potassium: 4.6 mEq/L (ref 3.7–5.3)
Sodium: 135 mEq/L — ABNORMAL LOW (ref 137–147)

## 2013-08-03 LAB — SURGICAL PCR SCREEN
MRSA, PCR: NEGATIVE
Staphylococcus aureus: NEGATIVE

## 2013-08-03 LAB — LIPASE, BLOOD: Lipase: 26 U/L (ref 11–59)

## 2013-08-03 SURGERY — APPENDECTOMY, LAPAROSCOPIC
Anesthesia: General

## 2013-08-03 MED ORDER — ACETAMINOPHEN 325 MG PO TABS: 650.0000 mg | ORAL_TABLET | Freq: Four times a day (QID) | ORAL | Status: DC | PRN

## 2013-08-03 MED ORDER — LACTATED RINGERS IV SOLN
INTRAVENOUS | Status: DC | PRN
  Administered 2013-08-03: 09:00:00 via INTRAVENOUS

## 2013-08-03 MED ORDER — SODIUM CHLORIDE 0.9 % IV SOLN
1000.0000 mL | Freq: Once | INTRAVENOUS | Status: AC
  Administered 2013-08-03: 1000 mL via INTRAVENOUS

## 2013-08-03 MED ORDER — MORPHINE SULFATE 4 MG/ML IJ SOLN
6.0000 mg | Freq: Once | INTRAMUSCULAR | Status: AC
  Administered 2013-08-03: 6 mg via INTRAVENOUS
  Filled 2013-08-03: qty 2

## 2013-08-03 MED ORDER — CISATRACURIUM BESYLATE (PF) 10 MG/5ML IV SOLN
INTRAVENOUS | Status: DC | PRN
  Administered 2013-08-03: 5 mg via INTRAVENOUS
  Administered 2013-08-03: 1 mg via INTRAVENOUS

## 2013-08-03 MED ORDER — 0.9 % SODIUM CHLORIDE (POUR BTL) OPTIME
TOPICAL | Status: DC | PRN
  Administered 2013-08-03: 1000 mL

## 2013-08-03 MED ORDER — ALUM & MAG HYDROXIDE-SIMETH 200-200-20 MG/5ML PO SUSP: 30.0000 mL | Freq: Four times a day (QID) | ORAL | Status: DC | PRN

## 2013-08-03 MED ORDER — ONDANSETRON HCL 4 MG/2ML IJ SOLN
4.0000 mg | Freq: Once | INTRAMUSCULAR | Status: AC
  Administered 2013-08-03: 4 mg via INTRAVENOUS
  Filled 2013-08-03: qty 2

## 2013-08-03 MED ORDER — IBUPROFEN 600 MG PO TABS
600.0000 mg | ORAL_TABLET | Freq: Four times a day (QID) | ORAL | Status: DC | PRN
  Filled 2013-08-03: qty 1

## 2013-08-03 MED ORDER — ONDANSETRON HCL 4 MG/2ML IJ SOLN
INTRAMUSCULAR | Status: AC
  Filled 2013-08-03: qty 2

## 2013-08-03 MED ORDER — METOPROLOL TARTRATE 25 MG PO TABS
12.5000 mg | ORAL_TABLET | Freq: Two times a day (BID) | ORAL | Status: DC | PRN
  Filled 2013-08-03: qty 0.5

## 2013-08-03 MED ORDER — HEPARIN SODIUM (PORCINE) 5000 UNIT/ML IJ SOLN
5000.0000 [IU] | Freq: Three times a day (TID) | INTRAMUSCULAR | Status: DC
  Filled 2013-08-03: qty 1

## 2013-08-03 MED ORDER — BISACODYL 10 MG RE SUPP: 10.0000 mg | Freq: Two times a day (BID) | RECTAL | Status: DC | PRN

## 2013-08-03 MED ORDER — DEXTROSE 5 % IV SOLN
2.0000 g | Freq: Every day | INTRAVENOUS | Status: DC
  Administered 2013-08-03: 2 g via INTRAVENOUS
  Filled 2013-08-03: qty 2

## 2013-08-03 MED ORDER — ACETAMINOPHEN 10 MG/ML IV SOLN
1000.0000 mg | Freq: Once | INTRAVENOUS | Status: AC
  Administered 2013-08-03: 1000 mg via INTRAVENOUS
  Filled 2013-08-03: qty 100

## 2013-08-03 MED ORDER — ONDANSETRON HCL 4 MG/2ML IJ SOLN: 4.0000 mg | Freq: Four times a day (QID) | INTRAMUSCULAR | Status: DC | PRN

## 2013-08-03 MED ORDER — METRONIDAZOLE IN NACL 5-0.79 MG/ML-% IV SOLN
500.0000 mg | Freq: Four times a day (QID) | INTRAVENOUS | Status: DC
  Administered 2013-08-03: 500 mg via INTRAVENOUS
  Filled 2013-08-03 (×2): qty 100

## 2013-08-03 MED ORDER — PHENOL 1.4 % MT LIQD
2.0000 | OROMUCOSAL | Status: DC | PRN
  Filled 2013-08-03: qty 177

## 2013-08-03 MED ORDER — PROPOFOL 10 MG/ML IV BOLUS
INTRAVENOUS | Status: AC
  Filled 2013-08-03: qty 20

## 2013-08-03 MED ORDER — FENTANYL CITRATE 0.05 MG/ML IJ SOLN
INTRAMUSCULAR | Status: AC
  Filled 2013-08-03: qty 2

## 2013-08-03 MED ORDER — SODIUM CHLORIDE 0.9 % IV SOLN
1000.0000 mL | INTRAVENOUS | Status: DC
  Administered 2013-08-03: 1000 mL via INTRAVENOUS

## 2013-08-03 MED ORDER — ONDANSETRON HCL 4 MG/2ML IJ SOLN
INTRAMUSCULAR | Status: DC | PRN
  Administered 2013-08-03: 4 mg via INTRAVENOUS

## 2013-08-03 MED ORDER — ACETAMINOPHEN 325 MG PO TABS: 650.0000 mg | ORAL_TABLET | ORAL | Status: DC | PRN

## 2013-08-03 MED ORDER — CISATRACURIUM BESYLATE 20 MG/10ML IV SOLN
INTRAVENOUS | Status: AC
  Filled 2013-08-03: qty 10

## 2013-08-03 MED ORDER — EPHEDRINE SULFATE 50 MG/ML IJ SOLN
INTRAMUSCULAR | Status: AC
  Filled 2013-08-03: qty 1

## 2013-08-03 MED ORDER — ALBUTEROL SULFATE (2.5 MG/3ML) 0.083% IN NEBU
2.5000 mg | INHALATION_SOLUTION | Freq: Four times a day (QID) | RESPIRATORY_TRACT | Status: DC | PRN
  Administered 2013-08-03: 2.5 mg via RESPIRATORY_TRACT

## 2013-08-03 MED ORDER — MENTHOL 3 MG MT LOZG
1.0000 | LOZENGE | OROMUCOSAL | Status: DC | PRN
Start: 2013-08-03 — End: 2013-08-03
  Filled 2013-08-03: qty 9

## 2013-08-03 MED ORDER — LACTATED RINGERS IV SOLN
INTRAVENOUS | Status: DC | PRN
  Administered 2013-08-03: 1000 mL via INTRAVENOUS

## 2013-08-03 MED ORDER — DIPHENHYDRAMINE HCL 12.5 MG/5ML PO ELIX: 12.5000 mg | ORAL_SOLUTION | Freq: Four times a day (QID) | ORAL | Status: DC | PRN

## 2013-08-03 MED ORDER — ENOXAPARIN SODIUM 40 MG/0.4ML ~~LOC~~ SOLN
40.0000 mg | SUBCUTANEOUS | Status: DC
Start: 2013-08-04 — End: 2013-08-08
  Administered 2013-08-04 – 2013-08-08 (×5): 40 mg via SUBCUTANEOUS
  Filled 2013-08-03 (×6): qty 0.4

## 2013-08-03 MED ORDER — MAGIC MOUTHWASH
15.0000 mL | Freq: Four times a day (QID) | ORAL | Status: DC | PRN
  Filled 2013-08-03: qty 15

## 2013-08-03 MED ORDER — NEOSTIGMINE METHYLSULFATE 10 MG/10ML IV SOLN
INTRAVENOUS | Status: DC | PRN
  Administered 2013-08-03: 4 mg via INTRAVENOUS

## 2013-08-03 MED ORDER — ALBUTEROL SULFATE (2.5 MG/3ML) 0.083% IN NEBU
INHALATION_SOLUTION | RESPIRATORY_TRACT | Status: AC
  Filled 2013-08-03: qty 3

## 2013-08-03 MED ORDER — BUPIVACAINE-EPINEPHRINE 0.25% -1:200000 IJ SOLN
INTRAMUSCULAR | Status: DC | PRN
  Administered 2013-08-03: 20 mL

## 2013-08-03 MED ORDER — PROMETHAZINE HCL 25 MG/ML IJ SOLN: 6.2500 mg | Freq: Four times a day (QID) | INTRAMUSCULAR | Status: DC | PRN

## 2013-08-03 MED ORDER — KCL IN DEXTROSE-NACL 40-5-0.45 MEQ/L-%-% IV SOLN
INTRAVENOUS | Status: DC
  Administered 2013-08-03: 05:00:00 via INTRAVENOUS
  Filled 2013-08-03 (×3): qty 1000

## 2013-08-03 MED ORDER — KCL IN DEXTROSE-NACL 30-5-0.45 MEQ/L-%-% IV SOLN
INTRAVENOUS | Status: DC
  Administered 2013-08-03 – 2013-08-04 (×3): via INTRAVENOUS
  Administered 2013-08-05: 75 mL/h via INTRAVENOUS
  Administered 2013-08-06: 09:00:00 via INTRAVENOUS
  Filled 2013-08-03 (×10): qty 1000

## 2013-08-03 MED ORDER — IOHEXOL 350 MG/ML SOLN
100.0000 mL | Freq: Once | INTRAVENOUS | Status: AC | PRN
  Administered 2013-08-03: 100 mL via INTRAVENOUS

## 2013-08-03 MED ORDER — PHENYLEPHRINE 40 MCG/ML (10ML) SYRINGE FOR IV PUSH (FOR BLOOD PRESSURE SUPPORT)
PREFILLED_SYRINGE | INTRAVENOUS | Status: AC
  Filled 2013-08-03: qty 10

## 2013-08-03 MED ORDER — PROPOFOL 10 MG/ML IV BOLUS
INTRAVENOUS | Status: DC | PRN
  Administered 2013-08-03: 160 mg via INTRAVENOUS

## 2013-08-03 MED ORDER — PROMETHAZINE HCL 25 MG/ML IJ SOLN: 6.2500 mg | INTRAMUSCULAR | Status: DC | PRN

## 2013-08-03 MED ORDER — SODIUM CHLORIDE 3 % IN NEBU
INHALATION_SOLUTION | RESPIRATORY_TRACT | Status: AC
  Filled 2013-08-03: qty 15

## 2013-08-03 MED ORDER — HYDROMORPHONE HCL PF 1 MG/ML IJ SOLN: 0.5000 mg | INTRAMUSCULAR | Status: DC | PRN

## 2013-08-03 MED ORDER — SUCCINYLCHOLINE CHLORIDE 20 MG/ML IJ SOLN
INTRAMUSCULAR | Status: DC | PRN
  Administered 2013-08-03: 100 mg via INTRAVENOUS

## 2013-08-03 MED ORDER — FENTANYL CITRATE 0.05 MG/ML IJ SOLN: 25.0000 ug | INTRAMUSCULAR | Status: DC | PRN

## 2013-08-03 MED ORDER — ACETAMINOPHEN 650 MG RE SUPP: 650.0000 mg | Freq: Four times a day (QID) | RECTAL | Status: DC | PRN

## 2013-08-03 MED ORDER — SODIUM CHLORIDE 0.9 % IV SOLN
3.0000 g | Freq: Four times a day (QID) | INTRAVENOUS | Status: AC
  Administered 2013-08-03 – 2013-08-08 (×20): 3 g via INTRAVENOUS
  Filled 2013-08-03 (×20): qty 3

## 2013-08-03 MED ORDER — PHENYLEPHRINE HCL 10 MG/ML IJ SOLN
INTRAMUSCULAR | Status: DC | PRN
  Administered 2013-08-03: 80 ug via INTRAVENOUS

## 2013-08-03 MED ORDER — LACTATED RINGERS IV BOLUS (SEPSIS): 1000.0000 mL | Freq: Three times a day (TID) | INTRAVENOUS | Status: DC | PRN

## 2013-08-03 MED ORDER — BUPIVACAINE-EPINEPHRINE 0.25% -1:200000 IJ SOLN
INTRAMUSCULAR | Status: AC
  Filled 2013-08-03: qty 1

## 2013-08-03 MED ORDER — LIP MEDEX EX OINT: 1.0000 "application " | TOPICAL_OINTMENT | Freq: Two times a day (BID) | CUTANEOUS | Status: DC

## 2013-08-03 MED ORDER — HYDROCODONE-ACETAMINOPHEN 5-325 MG PO TABS
1.0000 | ORAL_TABLET | ORAL | Status: DC | PRN
  Administered 2013-08-03 – 2013-08-04 (×4): 2 via ORAL
  Filled 2013-08-03 (×4): qty 2

## 2013-08-03 MED ORDER — ONDANSETRON HCL 4 MG/2ML IJ SOLN
4.0000 mg | Freq: Four times a day (QID) | INTRAMUSCULAR | Status: DC | PRN
  Administered 2013-08-04 – 2013-08-05 (×2): 4 mg via INTRAVENOUS
  Filled 2013-08-03 (×2): qty 2

## 2013-08-03 MED ORDER — GLYCOPYRROLATE 0.2 MG/ML IJ SOLN
INTRAMUSCULAR | Status: DC | PRN
  Administered 2013-08-03: .6 mg via INTRAVENOUS

## 2013-08-03 MED ORDER — ONDANSETRON HCL 4 MG PO TABS: 4.0000 mg | ORAL_TABLET | Freq: Four times a day (QID) | ORAL | Status: DC | PRN

## 2013-08-03 MED ORDER — FENTANYL CITRATE 0.05 MG/ML IJ SOLN
INTRAMUSCULAR | Status: DC | PRN
Start: 2013-08-03 — End: 2013-08-03
  Administered 2013-08-03 (×4): 50 ug via INTRAVENOUS

## 2013-08-03 MED ORDER — LIDOCAINE HCL (CARDIAC) 20 MG/ML IV SOLN
INTRAVENOUS | Status: DC | PRN
  Administered 2013-08-03: 100 mg via INTRAVENOUS

## 2013-08-03 MED ORDER — HYDROMORPHONE HCL PF 1 MG/ML IJ SOLN
1.0000 mg | INTRAMUSCULAR | Status: DC | PRN
  Administered 2013-08-03: 1 mg via INTRAVENOUS
  Filled 2013-08-03: qty 1

## 2013-08-03 MED ORDER — DIPHENHYDRAMINE HCL 50 MG/ML IJ SOLN: 12.5000 mg | Freq: Four times a day (QID) | INTRAMUSCULAR | Status: DC | PRN

## 2013-08-03 MED ORDER — SODIUM CHLORIDE 0.9 % IJ SOLN
INTRAMUSCULAR | Status: AC
  Filled 2013-08-03: qty 10

## 2013-08-03 MED ORDER — MEPERIDINE HCL 50 MG/ML IJ SOLN
6.2500 mg | INTRAMUSCULAR | Status: DC | PRN
Start: 2013-08-03 — End: 2013-08-03

## 2013-08-03 SURGICAL SUPPLY — 40 items
APL SKNCLS STERI-STRIP NONHPOA (GAUZE/BANDAGES/DRESSINGS) ×1
APPLIER CLIP ROT 10 11.4 M/L (STAPLE)
APR CLP MED LRG 11.4X10 (STAPLE)
BAG SPEC RTRVL LRG 6X4 10 (ENDOMECHANICALS) ×1
BENZOIN TINCTURE PRP APPL 2/3 (GAUZE/BANDAGES/DRESSINGS) ×2 IMPLANT
CANISTER SUCTION 2500CC (MISCELLANEOUS) ×1 IMPLANT
CLIP APPLIE ROT 10 11.4 M/L (STAPLE) IMPLANT
CUTTER FLEX LINEAR 45M (STAPLE) ×1 IMPLANT
DECANTER SPIKE VIAL GLASS SM (MISCELLANEOUS) ×2 IMPLANT
DRAPE LAPAROSCOPIC ABDOMINAL (DRAPES) ×2 IMPLANT
ELECT REM PT RETURN 9FT ADLT (ELECTROSURGICAL) ×2
ELECTRODE REM PT RTRN 9FT ADLT (ELECTROSURGICAL) ×1 IMPLANT
ENDOLOOP SUT PDS II  0 18 (SUTURE)
ENDOLOOP SUT PDS II 0 18 (SUTURE) IMPLANT
GLOVE BIOGEL PI IND STRL 7.0 (GLOVE) ×1 IMPLANT
GLOVE BIOGEL PI INDICATOR 7.0 (GLOVE) ×4
GLOVE SURG ORTHO 8.0 STRL STRW (GLOVE) ×2 IMPLANT
GOWN STRL REUS W/TWL LRG LVL3 (GOWN DISPOSABLE) ×2 IMPLANT
GOWN STRL REUS W/TWL XL LVL3 (GOWN DISPOSABLE) ×4 IMPLANT
KIT BASIN OR (CUSTOM PROCEDURE TRAY) ×2 IMPLANT
MANIFOLD NEPTUNE II (INSTRUMENTS) ×1 IMPLANT
PENCIL BUTTON HOLSTER BLD 10FT (ELECTRODE) IMPLANT
POUCH SPECIMEN RETRIEVAL 10MM (ENDOMECHANICALS) ×1 IMPLANT
RELOAD 45 VASCULAR/THIN (ENDOMECHANICALS) IMPLANT
RELOAD STAPLE 45 2.5 WHT GRN (ENDOMECHANICALS) IMPLANT
RELOAD STAPLE 45 3.5 BLU ETS (ENDOMECHANICALS) IMPLANT
RELOAD STAPLE TA45 3.5 REG BLU (ENDOMECHANICALS) ×2 IMPLANT
SCALPEL HARMONIC ACE (MISCELLANEOUS) ×1 IMPLANT
SET IRRIG TUBING LAPAROSCOPIC (IRRIGATION / IRRIGATOR) ×1 IMPLANT
SOLUTION ANTI FOG 6CC (MISCELLANEOUS) ×2 IMPLANT
STRIP CLOSURE SKIN 1/2X4 (GAUZE/BANDAGES/DRESSINGS) ×2 IMPLANT
SUT MNCRL AB 4-0 PS2 18 (SUTURE) ×2 IMPLANT
TOWEL OR 17X26 10 PK STRL BLUE (TOWEL DISPOSABLE) ×2 IMPLANT
TRAY FOLEY CATH 14FRSI W/METER (CATHETERS) ×1 IMPLANT
TRAY LAP CHOLE (CUSTOM PROCEDURE TRAY) ×2 IMPLANT
TROCAR BLADELESS OPT 5 75 (ENDOMECHANICALS) ×2 IMPLANT
TROCAR XCEL BLUNT TIP 100MML (ENDOMECHANICALS) ×2 IMPLANT
TROCAR XCEL NON-BLD 11X100MML (ENDOMECHANICALS) ×2 IMPLANT
TUBING INSUFFLATION 10FT LAP (TUBING) ×2 IMPLANT
WATER STERILE IRR 1500ML POUR (IV SOLUTION) ×1 IMPLANT

## 2013-08-03 NOTE — ED Provider Notes (Signed)
CSN: [id number]     Arrival date & time 08/02/13  2354 History   First MD Initiated Contact with Patient 08/03/13 0018     Chief Complaint  Patient presents with  . Abdominal Pain  . Emesis      HPI Patient awoke with nausea and vomiting this morning and began having worsening lower abdominal pain.  Now he reports his pain is more focused on his right side.  No diarrhea.  He does have a history of endovascular repair of an abdominal aneurysm.  He is on aspirin only.  No recent fevers or chills.  No back pain.  No urinary complaints.  Symptoms are mild to moderate in severity.  Pain is worse with movement and palpation of his lower abdomen   Past Medical History  Diagnosis Date  . Hyperlipidemia   . Varicose veins   . Colon polyp 10/2006  . Erectile dysfunction   . Hypertension   . Tuberculosis     age 16  . Arthritis    Past Surgical History  Procedure Laterality Date  . Knee surgery      right  . Inguinal hernia repair      right  . Hernia repair    . Abdominal aortic aneurysm repair  11/03/2011   Family History  Problem Relation Age of Onset  . Tuberculosis Father   . Tuberculosis Sister    History  Substance Use Topics  . Smoking status: Former Smoker -- 45 years    Types: Cigarettes    Quit date: 11/05/2011  . Smokeless tobacco: Never Used  . Alcohol Use: 15.0 oz/week    4 Glasses of wine, 21 Cans of beer per week    Review of Systems  All other systems reviewed and are negative.     Allergies  Review of patient's allergies indicates no known allergies.  Home Medications   Prior to Admission medications   Medication Sig Start Date End Date Taking? Authorizing Provider  aspirin EC 81 MG tablet Take 81 mg by mouth daily.   Yes Historical Provider, MD  losartan (COZAAR) 25 MG tablet Take 25 mg by mouth daily.   Yes Historical Provider, MD  sildenafil (VIAGRA) 100 MG tablet Take 100 mg by mouth daily as needed. For erectile dysfunction   Yes Historical  Provider, MD   BP 92/54  Pulse 81  Temp(Src) 98.1 F (36.7 C) (Oral)  Resp 18  SpO2 93% Physical Exam  Nursing note and vitals reviewed. Constitutional: He is oriented to person, place, and time. He appears well-developed and well-nourished.  HENT:  Head: Normocephalic and atraumatic.  Eyes: EOM are normal.  Neck: Normal range of motion.  Cardiovascular: Normal rate, regular rhythm, normal heart sounds and intact distal pulses.   Pulmonary/Chest: Effort normal and breath sounds normal. No respiratory distress.  Abdominal: Soft. He exhibits no distension.  Diffuse lower abdominal tenderness with right greater than left tenderness.  No right upper quadrant pain  Musculoskeletal: Normal range of motion.  Neurological: He is alert and oriented to person, place, and time.  Skin: Skin is warm and dry.  Psychiatric: He has a normal mood and affect. Judgment normal.    ED Course  Procedures (including critical care time) Labs Review Labs Reviewed  CBC WITH DIFFERENTIAL - Abnormal; Notable for the following:    Neutrophils Relative % 83 (*)    Lymphocytes Relative 11 (*)    Lymphs Abs 0.6 (*)    All other components within normal limits  COMPREHENSIVE METABOLIC PANEL - Abnormal; Notable for the following:    Sodium 136 (*)    Glucose, Bld 161 (*)    BUN 27 (*)    Creatinine, Ser 1.46 (*)    Alkaline Phosphatase 37 (*)    GFR calc non Af Amer 48 (*)    GFR calc Af Amer 55 (*)    All other components within normal limits  LIPASE, BLOOD    Imaging Review Ct Angio Abd/pel W/ And/or W/o  08/03/2013   CLINICAL DATA:  Nausea and vomiting and abdominal pain in the mid abdomen and right lower quadrant with normal white blood cell count ; history of previous abdominal aortic aneurysm resume repair  EXAM: CT ANGIOGRAPHY ABDOMEN AND PELVIS WITH CONTRAST AND WITHOUT CONTRAST  TECHNIQUE: Multidetector CT imaging of the abdomen and pelvis was performed using the standard protocol during bolus  administration of intravenous contrast. Multiplanar reconstructed images and MIPs were obtained and reviewed to evaluate the vascular anatomy.  CONTRAST:  100mL OMNIPAQUE IOHEXOL 350 MG/ML SOLN  COMPARISON:  CT CTA ABD/PEL W/CM AND/OR W/O CM dated 06/06/2012  FINDINGS: There is a stable appearing infrarenal abdominal AA aortic stent graft. There are no findings to suggest an endoleak. The caliber of the aortic aneurysm at this level measures 4 cm AP x 4.1 cm transversely. This has not increased since the previous study and is in fact slightly smaller. There is no evidence of a perianeurysmal hematoma.  The liver exhibits decreased density diffusely consistent with fatty infiltrated change. The gallbladder is adequately distended with no evidence of stones. The pancreas, spleen, partially distended stomach, adrenal glands, and kidneys are normal in appearance.  There is an abnormal appearance of the appendix on today's study when compared to the previous study demonstrated on images 89 through 101 of series 6. It is edematous and fluid filled. There is a small amount of periappendiceal inflammatory change. There is free fluid within the pelvis. The small-bowel loops exhibit mild distention without evidence of obstruction. There is mild wall thickening of several loops of small bowel which is nonspecific. There is a moderate amount of fluid and gas within the colon. There is no evidence of colitis or acute diverticulitis. There are few sigmoid diverticula demonstrated. There is no umbilical hernia. There is a shallow fat containing inguinal hernia.  The lumbar spine and bony pelvis exhibit no acute abnormalities.  The lung bases exhibit atelectasis and a trace of pleural fluid bilaterally.  Review of the MIP images confirms the above findings.  IMPRESSION: 1. Since last month's study the appendix has increased in caliber and appears edematous. There is only a small amount of surrounding inflammatory change however.  There is a moderate amount of free fluid in the lower abdomen and in the pelvis. 2. There is mild thickening of loops of small bowel in the lower abdomen without evidence of obstruction. This could reflect enteritis in the appropriate clinical setting. There is sigmoid diverticulosis without evidence of acute diverticulitis. 3. There is no acute change in the appearance of the infrarenal abdominal aortic stent graft. There is no evidence of an endoleak nor of perianeurysmal leak. 4. There is no acute hepatobiliary abnormality nor acute urinary tract abnormality. 5. These results were called by telephone at the time of interpretation on 08/03/2013 at 3:06 AM to Dr. Azalia BilisKEVIN Michel Hendon , who verbally acknowledged these results.   Electronically Signed   By: David  [place]az[place]   On: 08/03/2013 03:06     EKG Interpretation  None      MDM   Final diagnoses:  None    3:24 AM Clinically the patient has appendicitis.  The appendix is abnormal on CT.  I discussed the case with general surgery, Dr. gross, who will by with the patient emergency department.    Lyanne CoKevin M Brylee Mcgreal, MD 08/03/13 [phone]

## 2013-08-03 NOTE — Anesthesia Preprocedure Evaluation (Signed)
Anesthesia Evaluation  Patient identified by MRN, date of birth, ID band Patient awake    Reviewed: Allergy & Precautions, H&P , NPO status , Patient's Chart, lab work & pertinent test results  Airway Mallampati: II TM Distance: >3 FB Neck ROM: Full    Dental no notable dental hx.    Pulmonary neg pulmonary ROS, former smoker,  breath sounds clear to auscultation  Pulmonary exam normal       Cardiovascular hypertension, Pt. on medications Rhythm:Regular Rate:Normal  S/p endovascular AAA repair   Neuro/Psych negative neurological ROS  negative psych ROS   GI/Hepatic negative GI ROS, Neg liver ROS,   Endo/Other  negative endocrine ROS  Renal/GU negative Renal ROS  negative genitourinary   Musculoskeletal negative musculoskeletal ROS (+)   Abdominal   Peds negative pediatric ROS (+)  Hematology negative hematology ROS (+)   Anesthesia Other Findings   Reproductive/Obstetrics negative OB ROS                           Anesthesia Physical Anesthesia Plan  ASA: II  Anesthesia Plan: General   Post-op Pain Management:    Induction: Intravenous  Airway Management Planned: Oral ETT  Additional Equipment:   Intra-op Plan:   Post-operative Plan: Extubation in OR  Informed Consent: I have reviewed the patients History and Physical, chart, labs and discussed the procedure including the risks, benefits and alternatives for the proposed anesthesia with the patient or authorized representative who has indicated his/her understanding and acceptance.   Dental advisory given  Plan Discussed with: CRNA  Anesthesia Plan Comments:         Anesthesia Quick Evaluation

## 2013-08-03 NOTE — Progress Notes (Signed)
General Surgery [facility]  Patient seen and examined.  Discussed with Dr. Michaell CowingGross.  History reviewed with patient and wife at bedside.  Will proceed with lap appendectomy this morning.  The risks and benefits of the procedure have been discussed at length with the patient.  The patient understands the proposed procedure, potential alternative treatments, and the course of recovery to be expected.  All of the patient's questions have been answered at this time.  The patient wishes to proceed with surgery.  Velora Hecklerodd M. Shonte Soderlund, MD, [facility] Office: [phone]

## 2013-08-03 NOTE — Op Note (Signed)
OPERATIVE REPORT - LAPAROSCOPIC APPENDECTOMY  Preop diagnosis: Acute appendicitis  Postop diagnosis: Perforated appendicitis with peritonitis  Procedure: Laparoscopic appendectomy  Surgeon:  Velora Hecklerodd M. Allisson Schindel, MD, FACS  Anesthesia: General endotracheal  Estimated blood loss: Minimal  Preparation: Chlora-prep  Complications: None  Indications:  Patient is a 68 yo WM with onset of RLQ abdominal pain.  CT abdomen consistent with acute appendicitis.  Procedure:  Patient is brought to the operating room and placed in a supine position on the operating room table. Following administration of general anesthesia, a time out was held and the patient's name and procedure is confirmed. Patient is then prepped and draped in the usual strict aseptic fashion.  After ascertaining that an adequate level of anesthesia has been achieved, a peri-umbilical incision is made with a #15 blade. Dissection is carried down to the fascia. Fascia is incised in the midline and the peritoneal cavity is entered cautiously. A #0-vicryl pursestring suture is placed in the fascia. An Hassan cannula is introduced under direct vision and secured with the pursestring suture. The abdomen is insufflated with carbon dioxide. The laparoscope is introduced and the abdomen is explored. Operative ports are placed in the right upper quadrant and left lower quadrant. There was yellow cloudy fluid present throughout the abdominal cavity.  There was a fibrinous exudate adherent to multiple loops of small intestine.  Loops were mobilized and interloop collections evacuated.  Appendix if visualized in the RLQ.  The base of the appendix is necrotic with gross perforation.  Mesoappendix is divided with the harmonic scapel.  The base is completely mobilized at the cecal wall. Using an Endo-GIA stapler, the base of the appendix is transected on the cecal wall. There is good approximation of tissue along the staple line. There is good hemostasis  along the staple line. The appendix is placed into an endo-catch bag and withdrawn through the umbilical port.   The entire abdominal cavity was irrigated with warm saline and evacuated. Good hemostasis is noted. Ports are removed under direct vision. Good hemostasis is noted at the port sites. Pneumoperitoneum is released.  The #0-vicryl pursestring suture is tied securely. Skin incisions are anesthetized with local anesthetic. Wounds are closed with interrupted 4-0 Monocryl subcuticular sutures. Wounds are washed and dried and benzoin and Steri-Strips are applied. Dressings are applied. The patient is awakened from anesthesia and brought to the recovery room. The patient tolerated the procedure well.  Velora Hecklerodd M. Windie Marasco, MD, [facility] Office: [phone]

## 2013-08-03 NOTE — ED Notes (Signed)
Pt placed on 2L [place]

## 2013-08-03 NOTE — Anesthesia Procedure Notes (Signed)
Procedure Name: Intubation Date/Time: 08/03/2013 9:32 AM Performed by: Leroy LibmanEARDON, Teretha Chalupa L Patient Re-evaluated:Patient Re-evaluated prior to inductionOxygen Delivery Method: Circle system utilized Preoxygenation: Pre-oxygenation with 100% oxygen Intubation Type: IV induction, Rapid sequence and Cricoid Pressure applied Laryngoscope Size: Miller and 3 Grade View: Grade II Tube type: Oral Tube size: 8.0 mm Number of attempts: 1 Airway Equipment and Method: Stylet Placement Confirmation: ETT inserted through vocal cords under direct vision,  breath sounds checked- equal and bilateral and positive ETCO2 Secured at: 21 cm Dental Injury: Teeth and Oropharynx as per pre-operative assessment

## 2013-08-03 NOTE — ED Notes (Signed)
Pt states he woke up about 7 this morning with vomiting that lasted til about 11 then started having abd pain  Pt went back to bed and slept for several hours but the abd pain has continued  Pt states the pain started at the umbilicus and has worked its way down to his lower abdomen  Denies diarrhea  Pt states he had a BM this evening normal  Pt has an abd aneursym and has a stent in for that

## 2013-08-03 NOTE — Transfer of Care (Signed)
Immediate Anesthesia Transfer of Care Note  Patient: [name] Juarez  Procedure(s) Performed: Procedure(s): APPENDECTOMY LAPAROSCOPIC (N/A)  Patient Location: PACU  Anesthesia Type:General  Level of Consciousness: awake  Airway & Oxygen Therapy: Patient Spontanous Breathing and Patient connected to face mask oxygen  Post-op Assessment: Report given to PACU RN and Post -op Vital signs reviewed and stable  Post vital signs: Reviewed and stable  Complications: No apparent anesthesia complications

## 2013-08-03 NOTE — Care Management Note (Signed)
    Page 1 of 1   08/03/2013     12:12:56 PM CARE MANAGEMENT NOTE 08/03/2013  [facility]  [name]   Account Number:  [id number]  Date Initiated:  08/03/2013  Documentation initiated by:  [facility],Jessina Marse  Subjective/Objective Assessment:   68 year old male admitted with Perforated appendicitis with peritonitis and uncderwent Laparoscopic appendectomy.     Action/Plan:   From home with wife.   Anticipated DC Date:  08/06/2013   Anticipated DC Plan:  HOME/SELF CARE      DC Planning Services  CM consult      Choice offered to / List presented to:             Status of service:  In process, will continue to follow Medicare Important Message given?   (If response is "NO", the following Medicare IM given date fields will be blank) Date Medicare IM given:   Date Additional Medicare IM given:    Discharge Disposition:    Per UR Regulation:  Reviewed for med. necessity/level of care/duration of stay  If discussed at Long Length of Stay Meetings, dates discussed:    Comments:  08/03/13 [name] Juarez HuxleyUTH Mykael Trott RN BSN [phone] No d/c needs at this time, CM will continue to follow and assess.

## 2013-08-03 NOTE — Anesthesia Postprocedure Evaluation (Signed)
  Anesthesia Post-op Note  Patient: [name] Juarez  Procedure(s) Performed: Procedure(s) (LRB): APPENDECTOMY LAPAROSCOPIC (N/A)  Patient Location: PACU  Anesthesia Type: General  Level of Consciousness: awake and alert   Airway and Oxygen Therapy: Patient Spontanous Breathing  Post-op Pain: mild  Post-op Assessment: Post-op Vital signs reviewed, Patient's Cardiovascular Status Stable, Respiratory Function Stable, Patent Airway and No signs of Nausea or vomiting  Last Vitals:  Filed Vitals:   08/03/13 1345  BP: 90/58  Pulse: 79  Temp: 36.3 C  Resp: 16    Post-op Vital Signs: stable   Complications: No apparent anesthesia complications

## 2013-08-03 NOTE — H&P (Signed)
Washburn, MD, [address] [place], [place] [address] Phone: [phone] FAX: [place]  Nov 01, 1945 [id number]  CARE TEAM:  PCP: Shirline Frees, MD  Outpatient Care Team: Patient Care Team: Shirline Frees, MD as PCP - General (Family Medicine) Mal Misty, MD as Consulting Physician (Vascular Surgery)  Inpatient Treatment Team: Treatment Team: Attending Provider: Nolon Nations, MD; Registered Nurse: Loel Ro, RN; Registered Nurse: Zeb Comfort, RN; Technician: Marlene Bast, NT; Consulting Physician: Nolon Nations, MD  This patient is a 68 y.o.male who presents today for surgical evaluation at the request of Dr Jola Schmidt, Dirk Dress ED MD.   Reason for evaluation: Appendicitis  Pleasant gentleman.  He comes today with his wife.  They are originally from [place].  He awoke yesterday morning with sharp abdominal pain.  Prolonged nausea vomiting as well.  Pain seems to be more upper and diffuse.  No sick contacts or travel history.  He does have a history of endovascular repair of an abdominal aneurysm 2013.  Denies any change in his ability to walk.  No radiation of pain down into his leg or paresthesias.  Because of worsening symptoms, he came into the emergency room.  Pain is now shifted more towards the lower abdomen.  Right seems to be worse than left.  Examination CT scan findings suspicious for appendicitis.  Surgical consultation requested.  He has had a colonoscopy in the distant past with no major polyps or other abnormalities.  No personal nor family history of GI/colon cancer, inflammatory bowel disease, irritable bowel syndrome, allergy such as Celiac Sprue, dietary/dairy problems, colitis, ulcers nor gastritis.  No recent sick contacts/gastroenteritis.  No travel outside the country.  No changes in diet.  No dysphagia to solids or liquids.  No significant heartburn or reflux.  No  hematochezia, hematemesis, coffee ground emesis.  No evidence of prior gastric/peptic ulceration.    Past Medical History  Diagnosis Date  . Hyperlipidemia   . Varicose veins   . Colon polyp 10/2006  . Erectile dysfunction   . Hypertension   . Tuberculosis     age 68  . Arthritis     Past Surgical History  Procedure Laterality Date  . Knee surgery      right  . Inguinal hernia repair      right  . Hernia repair    . Abdominal aortic aneurysm repair  11/03/2011    History   Social History  . Marital Status: Married    Spouse Name: N/A    Number of Children: N/A  . Years of Education: N/A   Occupational History  . Not on file.   Social History Main Topics  . Smoking status: Former Smoker -- 45 years    Types: Cigarettes    Quit date: 11/05/2011  . Smokeless tobacco: Never Used  . Alcohol Use: 15.0 oz/week    4 Glasses of wine, 21 Cans of beer per week  . Drug Use: No  . Sexual Activity: Not on file   Other Topics Concern  . Not on file   Social History Narrative   Originally from [place].  In [place] since 1970s    Family History  Problem Relation Age of Onset  . Tuberculosis Father   . Tuberculosis Sister     Current Facility-Administered Medications  Medication Dose Route Frequency Provider Last Rate Last Dose  .  0.9 %  sodium chloride infusion  1,000 mL Intravenous Continuous Hoy Morn, MD 125 mL/hr at 08/03/13 0108 1,000 mL at 08/03/13 0108  . acetaminophen (TYLENOL) tablet 650 mg  650 mg Oral Q6H PRN Adin Hector, MD       Or  . acetaminophen (TYLENOL) suppository 650 mg  650 mg Rectal Q6H PRN Adin Hector, MD      . alum & mag hydroxide-simeth (MAALOX/MYLANTA) 200-200-20 MG/5ML suspension 30 mL  30 mL Oral Q6H PRN Adin Hector, MD      . bisacodyl (DULCOLAX) suppository 10 mg  10 mg Rectal Q12H PRN Adin Hector, MD      . cefTRIAXone (ROCEPHIN) 2 g in dextrose 5 % 50 mL IVPB  2 g Intravenous Q24H Adin Hector, MD      .  dextrose 5 % and 0.45 % NaCl with KCl 40 mEq/L infusion   Intravenous Continuous Adin Hector, MD      . diphenhydrAMINE (BENADRYL) injection 12.5-25 mg  12.5-25 mg Intravenous Q6H PRN Adin Hector, MD       Or  . diphenhydrAMINE (BENADRYL) 12.5 MG/5ML elixir 12.5-25 mg  12.5-25 mg Oral Q6H PRN Adin Hector, MD      . Derrill Memo ON 08/04/2013] heparin injection 5,000 Units  5,000 Units Subcutaneous 3 times per day Adin Hector, MD      . HYDROmorphone (DILAUDID) injection 0.5-2 mg  0.5-2 mg Intravenous Q2H PRN Adin Hector, MD      . lactated ringers bolus 1,000 mL  1,000 mL Intravenous Q8H PRN Adin Hector, MD      . lip balm (CARMEX) ointment 1 application  1 application Topical BID Adin Hector, MD      . magic mouthwash  15 mL Oral QID PRN Adin Hector, MD      . menthol-cetylpyridinium (CEPACOL) lozenge 3 mg  1 lozenge Oral PRN Adin Hector, MD      . metoprolol tartrate (LOPRESSOR) tablet 12.5 mg  12.5 mg Oral Q12H PRN Adin Hector, MD      . metroNIDAZOLE (FLAGYL) IVPB 500 mg  500 mg Intravenous Q6H Adin Hector, MD      . ondansetron (ZOFRAN) injection 4 mg  4 mg Intravenous Q6H PRN Adin Hector, MD      . phenol (CHLORASEPTIC) mouth spray 2 spray  2 spray Mouth/Throat PRN Adin Hector, MD      . promethazine (PHENERGAN) injection 6.25-12.5 mg  6.25-12.5 mg Intravenous Q6H PRN Adin Hector, MD       Current Outpatient Prescriptions  Medication Sig Dispense Refill  . aspirin EC 81 MG tablet Take 81 mg by mouth daily.      Marland Kitchen losartan (COZAAR) 25 MG tablet Take 25 mg by mouth daily.      . sildenafil (VIAGRA) 100 MG tablet Take 100 mg by mouth daily as needed. For erectile dysfunction         No Known Allergies  ROS: Constitutional:  No fevers, chills, sweats.  Weight stable Eyes:  No vision changes, No discharge HENT:  No sore throats, nasal drainage Lymph: No neck swelling, No bruising easily Pulmonary:  No cough, productive sputum CV: No  orthopnea, PND  Patient walks 2-3 miles without difficulty.  No exertional chest/neck/shoulder/arm pain. GI: No personal nor family history of GI/colon cancer, inflammatory bowel disease, irritable bowel syndrome, allergy such as Celiac Sprue, dietary/dairy problems, colitis, ulcers  nor gastritis.  No recent sick contacts/gastroenteritis.  No travel outside the country.  No changes in diet. Renal: No UTIs, No hematuria Genital:  No drainage, bleeding, masses Musculoskeletal: No severe joint pain.  Good ROM major joints Skin:  No sores or lesions.  No rashes Heme/Lymph:  No easy bleeding.  No swollen lymph nodes Neuro: No focal weakness/numbness.  No seizures Psych: No suicidal ideation.  No hallucinations  BP 93/57  Pulse 72  Temp(Src) 98.1 F (36.7 C) (Oral)  Resp 18  SpO2 95%  Physical Exam: General: Pt awake alert/oriented x4 in no major acute distress Eyes: PERRL, normal EOM. Sclera nonicteric Neuro: CN II-XII intact w/o focal sensory/motor deficits. Lymph: No head/neck/groin lymphadenopathy Psych:  No delerium/psychosis/paranoia HENT: Normocephalic, Mucus membranes moist.  No thrush Neck: Supple, No tracheal deviation Chest: No pain.  Good respiratory excursion. CV:  Pulses intact.  Regular rhythm Abdomen: Soft, Nondistended.  Mod TTP RLQ>LLQ.  Reproduction of pain with cough and then bedshake concerning for focal peritonitis.  No incarcerated hernias. Ext:  SCDs BLE.  No significant edema.  No cyanosis Skin: No petechiae / purpurea.  No major sores Musculoskeletal: No severe joint pain.  Good ROM major joints   Results:   Labs: Results for orders placed during the hospital encounter of 08/03/13 (from the past 48 hour(s))  CBC WITH DIFFERENTIAL     Status: Abnormal   Collection Time    08/03/13 12:42 AM      Result Value Ref Range   WBC 5.5  4.0 - 10.5 K/uL   RBC 4.79  4.22 - 5.81 MIL/uL   Hemoglobin 15.9  13.0 - 17.0 g/dL   HCT 45.9  39.0 - 52.0 %   MCV 95.8  78.0 -  100.0 fL   MCH 33.2  26.0 - 34.0 pg   MCHC 34.6  30.0 - 36.0 g/dL   RDW 13.2  11.5 - 15.5 %   Platelets 186  150 - 400 K/uL   Neutrophils Relative % 83 (*) 43 - 77 %   Neutro Abs 4.5  1.7 - 7.7 K/uL   Lymphocytes Relative 11 (*) 12 - 46 %   Lymphs Abs 0.6 (*) 0.7 - 4.0 K/uL   Monocytes Relative 6  3 - 12 %   Monocytes Absolute 0.3  0.1 - 1.0 K/uL   Eosinophils Relative 0  0 - 5 %   Eosinophils Absolute 0.0  0.0 - 0.7 K/uL   Basophils Relative 0  0 - 1 %   Basophils Absolute 0.0  0.0 - 0.1 K/uL  COMPREHENSIVE METABOLIC PANEL     Status: Abnormal   Collection Time    08/03/13 12:42 AM      Result Value Ref Range   Sodium 136 (*) 137 - 147 mEq/L   Potassium 4.3  3.7 - 5.3 mEq/L   Chloride 98  96 - 112 mEq/L   CO2 21  19 - 32 mEq/L   Glucose, Bld 161 (*) 70 - 99 mg/dL   BUN 27 (*) 6 - 23 mg/dL   Creatinine, Ser 1.46 (*) 0.50 - 1.35 mg/dL   Calcium 9.3  8.4 - 10.5 mg/dL   Total Protein 7.0  6.0 - 8.3 g/dL   Albumin 3.8  3.5 - 5.2 g/dL   AST 15  0 - 37 U/L   ALT 13  0 - 53 U/L   Alkaline Phosphatase 37 (*) 39 - 117 U/L   Total Bilirubin 1.2  0.3 - 1.2 mg/dL  GFR calc non Af Amer 48 (*) >90 mL/min   GFR calc Af Amer 55 (*) >90 mL/min   Comment: (NOTE)     The eGFR has been calculated using the CKD EPI equation.     This calculation has not been validated in all clinical situations.     eGFR's persistently <90 mL/min signify possible Chronic Kidney     Disease.  LIPASE, BLOOD     Status: None   Collection Time    08/03/13 12:42 AM      Result Value Ref Range   Lipase 26  11 - 59 U/L    Imaging / Studies: Ct Angio Abd/pel W/ And/or W/o  08/03/2013   CLINICAL DATA:  Nausea and vomiting and abdominal pain in the mid abdomen and right lower quadrant with normal white blood cell count ; history of previous abdominal aortic aneurysm resume repair  EXAM: CT ANGIOGRAPHY ABDOMEN AND PELVIS WITH CONTRAST AND WITHOUT CONTRAST  TECHNIQUE: Multidetector CT imaging of the abdomen and  pelvis was performed using the standard protocol during bolus administration of intravenous contrast. Multiplanar reconstructed images and MIPs were obtained and reviewed to evaluate the vascular anatomy.  CONTRAST:  162m OMNIPAQUE IOHEXOL 350 MG/ML SOLN  COMPARISON:  CT CTA ABD/PEL W/CM AND/OR W/O CM dated 06/06/2012  FINDINGS: There is a stable appearing infrarenal abdominal AA aortic stent graft. There are no findings to suggest an endoleak. The caliber of the aortic aneurysm at this level measures 4 cm AP x 4.1 cm transversely. This has not increased since the previous study and is in fact slightly smaller. There is no evidence of a perianeurysmal hematoma.  The liver exhibits decreased density diffusely consistent with fatty infiltrated change. The gallbladder is adequately distended with no evidence of stones. The pancreas, spleen, partially distended stomach, adrenal glands, and kidneys are normal in appearance.  There is an abnormal appearance of the appendix on today's study when compared to the previous study demonstrated on images 89 through 101 of series 6. It is edematous and fluid filled. There is a small amount of periappendiceal inflammatory change. There is free fluid within the pelvis. The small-bowel loops exhibit mild distention without evidence of obstruction. There is mild wall thickening of several loops of small bowel which is nonspecific. There is a moderate amount of fluid and gas within the colon. There is no evidence of colitis or acute diverticulitis. There are few sigmoid diverticula demonstrated. There is no umbilical hernia. There is a shallow fat containing inguinal hernia.  The lumbar spine and bony pelvis exhibit no acute abnormalities.  The lung bases exhibit atelectasis and a trace of pleural fluid bilaterally.  Review of the MIP images confirms the above findings.  IMPRESSION: 1. Since last month's study the appendix has increased in caliber and appears edematous. There is only a  small amount of surrounding inflammatory change however. There is a moderate amount of free fluid in the lower abdomen and in the pelvis. 2. There is mild thickening of loops of small bowel in the lower abdomen without evidence of obstruction. This could reflect enteritis in the appropriate clinical setting. There is sigmoid diverticulosis without evidence of acute diverticulitis. 3. There is no acute change in the appearance of the infrarenal abdominal aortic stent graft. There is no evidence of an endoleak nor of perianeurysmal leak. 4. There is no acute hepatobiliary abnormality nor acute urinary tract abnormality. 5. These results were called by telephone at the time of interpretation on 08/03/2013 at  3:06 AM to Dr. Jola Schmidt , who verbally acknowledged these results.   Electronically Signed   By: David  Martinique   On: 08/03/2013 03:06    Medications / Allergies: per chart  Antibiotics: Anti-infectives   Start     Dose/Rate Route Frequency Ordered Stop   08/03/13 0345  cefTRIAXone (ROCEPHIN) 2 g in dextrose 5 % 50 mL IVPB    Comments:  Pharmacy may adjust dosing strength / duration / interval for maximal efficacy   2 g 100 mL/hr over 30 Minutes Intravenous Every 24 hours 08/03/13 0342     08/03/13 0345  metroNIDAZOLE (FLAGYL) IVPB 500 mg     500 mg 100 mL/hr over 60 Minutes Intravenous Every 6 hours 08/03/13 0342        Assessment  [name] Juarez  68 y.o. male       Problem List:  Principal Problem:   Acute appendicitis   Acute appendicitis  Plan:  Admit.  IV antibiotics.  Laparoscopic appendectomy:  The anatomy & physiology of the digestive tract was discussed.  The pathophysiology of appendicitis was discussed.  Natural history risks without surgery was discussed.   I feel the risks of no intervention will lead to serious problems that outweigh the operative risks; therefore, I recommended diagnostic laparoscopy with removal of appendix to remove the pathology.  Laparoscopic &  open techniques were discussed.   I noted a good likelihood this will help address the problem.    Risks such as bleeding, infection, abscess, leak, reoperation, possible ostomy, hernia, heart attack, death, and other risks were discussed.  Goals of post-operative recovery were discussed as well.  We will work to minimize complications.  Questions were answered.  The patient expresses understanding & wishes to proceed with surgery.  -Pull hypertension medicines for now given soft/low blood pressure.  follow -VTE prophylaxis- SCDs, etc -mobilize as tolerated to help recovery    Adin Hector, M.D., F.A.C.S. Gastrointestinal and Minimally Invasive Surgery [facility] [address] [place], [place] [address] [phone] Main / Paging   08/03/2013  Note: This dictation was prepared with Dragon/digital dictation along with [facility] technology. Any transcriptional errors that result from this process are unintentional.

## 2013-08-04 LAB — BASIC METABOLIC PANEL
BUN: 26 mg/dL — ABNORMAL HIGH (ref 6–23)
CO2: 22 mEq/L (ref 19–32)
Calcium: 8.2 mg/dL — ABNORMAL LOW (ref 8.4–10.5)
Chloride: 102 mEq/L (ref 96–112)
Creatinine, Ser: 1.43 mg/dL — ABNORMAL HIGH (ref 0.50–1.35)
GFR calc Af Amer: 56 mL/min — ABNORMAL LOW (ref >90)
GFR calc non Af Amer: 48 mL/min — ABNORMAL LOW (ref >90)
Glucose, Bld: 125 mg/dL — ABNORMAL HIGH (ref 70–99)
Potassium: 4.8 mEq/L (ref 3.7–5.3)
Sodium: 134 mEq/L — ABNORMAL LOW (ref 137–147)

## 2013-08-04 LAB — CBC
HCT: 39.4 % (ref 39.0–52.0)
Hemoglobin: 13.1 g/dL (ref 13.0–17.0)
MCH: 32.4 pg (ref 26.0–34.0)
MCHC: 33.2 g/dL (ref 30.0–36.0)
MCV: 97.5 fL (ref 78.0–100.0)
Platelets: 151 10*3/uL (ref 150–400)
RBC: 4.04 MIL/uL — ABNORMAL LOW (ref 4.22–5.81)
RDW: 13.7 % (ref 11.5–15.5)
WBC: 6.9 10*3/uL (ref 4.0–10.5)

## 2013-08-04 MED ORDER — PANTOPRAZOLE SODIUM 40 MG PO TBEC
40.0000 mg | DELAYED_RELEASE_TABLET | Freq: Every day | ORAL | Status: DC
  Administered 2013-08-04 – 2013-08-06 (×3): 40 mg via ORAL
  Filled 2013-08-04 (×5): qty 1

## 2013-08-04 MED ORDER — ALUM & MAG HYDROXIDE-SIMETH 200-200-20 MG/5ML PO SUSP
30.0000 mL | Freq: Four times a day (QID) | ORAL | Status: DC | PRN
  Administered 2013-08-04: 30 mL via ORAL
  Filled 2013-08-04: qty 30

## 2013-08-04 NOTE — Progress Notes (Signed)
1 Day Post-Op lap appy Subjective: Pt denies nausea, no flatus or BM's.  Pain controlled.  Tolerating sips of clears.  Objective: Vital signs in last 24 hours: Temp:  [97.4 F (36.3 C)-98.6 F (37 C)] 97.8 F (36.6 C) (05/02 0500) Pulse Rate:  [70-103] 77 (05/02 0500) Resp:  [10-21] 16 (05/02 0500) BP: (87-133)/(46-70) 119/70 mmHg (05/02 0500) SpO2:  [89 %-97 %] 92 % (05/02 0500)   Intake/Output from previous day: 05/01 0701 - 05/02 0700 In: 2895 [P.O.:360; I.V.:2235; IV Piggyback:300] Out: 1315 [Urine:1265; Blood:50] Intake/Output this shift: Total I/O In: -  Out: 150 [Urine:150]   General appearance: alert and cooperative GI: abnormal findings:  distended and soft  Incision: no significant drainage  Lab Results:   Recent Labs  08/03/13 0520 08/04/13 0436  WBC 5.9 6.9  HGB 13.8 13.1  HCT 40.6 39.4  PLT 172 151   BMET  Recent Labs  08/03/13 0520 08/04/13 0436  NA 135* 134*  K 4.6 4.8  CL 102 102  CO2 20 22  GLUCOSE 120* 125*  BUN 27* 26*  CREATININE 1.35 1.43*  CALCIUM 7.9* 8.2*   PT/INR No results found for this basename: LABPROT, INR,  in the last 72 hours ABG No results found for this basename: PHART, PCO2, PO2, HCO3,  in the last 72 hours  MEDS, Scheduled . ampicillin-sulbactam (UNASYN) IV  3 g Intravenous Q6H  . enoxaparin (LOVENOX) injection  40 mg Subcutaneous Q24H    Studies/Results: Dg Chest Port 1 View  08/03/2013   CLINICAL DATA:  Postoperative shortness of breath, getting breathing treatment now, history hypertension  EXAM: PORTABLE CHEST - 1 VIEW  COMPARISON:  Portable exam 1058 hr compared to 11/03/2011  FINDINGS: Normal heart size, mediastinal contours and pulmonary vascularity for technique.  Subsegmental atelectasis at LEFT base.  Central bronchitic changes.  No acute infiltrate, pleural effusion or pneumothorax.  IMPRESSION: Bronchitic changes with subsegmental atelectasis at LEFT lung base.   Electronically Signed   By: Ulyses SouthwardMark   Boles M.D.   On: 08/03/2013 11:28   Ct Angio Abd/pel W/ And/or W/o  08/03/2013   CLINICAL DATA:  Nausea and vomiting and abdominal pain in the mid abdomen and right lower quadrant with normal white blood cell count ; history of previous abdominal aortic aneurysm resume repair  EXAM: CT ANGIOGRAPHY ABDOMEN AND PELVIS WITH CONTRAST AND WITHOUT CONTRAST  TECHNIQUE: Multidetector CT imaging of the abdomen and pelvis was performed using the standard protocol during bolus administration of intravenous contrast. Multiplanar reconstructed images and MIPs were obtained and reviewed to evaluate the vascular anatomy.  CONTRAST:  100mL OMNIPAQUE IOHEXOL 350 MG/ML SOLN  COMPARISON:  CT CTA ABD/PEL W/CM AND/OR W/O CM dated 06/06/2012  FINDINGS: There is a stable appearing infrarenal abdominal AA aortic stent graft. There are no findings to suggest an endoleak. The caliber of the aortic aneurysm at this level measures 4 cm AP x 4.1 cm transversely. This has not increased since the previous study and is in fact slightly smaller. There is no evidence of a perianeurysmal hematoma.  The liver exhibits decreased density diffusely consistent with fatty infiltrated change. The gallbladder is adequately distended with no evidence of stones. The pancreas, spleen, partially distended stomach, adrenal glands, and kidneys are normal in appearance.  There is an abnormal appearance of the appendix on today's study when compared to the previous study demonstrated on images 89 through 101 of series 6. It is edematous and fluid filled. There is a small amount of periappendiceal  inflammatory change. There is free fluid within the pelvis. The small-bowel loops exhibit mild distention without evidence of obstruction. There is mild wall thickening of several loops of small bowel which is nonspecific. There is a moderate amount of fluid and gas within the colon. There is no evidence of colitis or acute diverticulitis. There are few sigmoid diverticula  demonstrated. There is no umbilical hernia. There is a shallow fat containing inguinal hernia.  The lumbar spine and bony pelvis exhibit no acute abnormalities.  The lung bases exhibit atelectasis and a trace of pleural fluid bilaterally.  Review of the MIP images confirms the above findings.  IMPRESSION: 1. Since last month's study the appendix has increased in caliber and appears edematous. There is only a small amount of surrounding inflammatory change however. There is a moderate amount of free fluid in the lower abdomen and in the pelvis. 2. There is mild thickening of loops of small bowel in the lower abdomen without evidence of obstruction. This could reflect enteritis in the appropriate clinical setting. There is sigmoid diverticulosis without evidence of acute diverticulitis. 3. There is no acute change in the appearance of the infrarenal abdominal aortic stent graft. There is no evidence of an endoleak nor of perianeurysmal leak. 4. There is no acute hepatobiliary abnormality nor acute urinary tract abnormality. 5. These results were called by telephone at the time of interpretation on 08/03/2013 at 3:06 AM to Dr. Azalia BilisKEVIN CAMPOS , who verbally acknowledged these results.   Electronically Signed   By: David  Swazil[place]Jordan   On: 08/03/2013 03:06    Assessment: s/p Procedure(s): APPENDECTOMY LAPAROSCOPIC Patient Active Problem List   Diagnosis Date Noted  . Acute appendicitis with generalized peritonitis, perforation 08/03/2013  . Perforated appendicitis 08/03/2013  . Abdominal aneurysm without mention of rupture 09/28/2011  . Varicose veins of lower extremities with other complications 04/27/2011    Perforated appendicits  Plan: Cont clears as tolerated Ambulate Cont IV abx due to abd sepsis and perforation  Cr up a bit but UOP good.  Cont IVF's   LOS: 1 day     .Vanita PandaAlicia C Abby Stines, MD [facility] [place] [phone]   08/04/2013 10:33 AM

## 2013-08-05 LAB — BASIC METABOLIC PANEL
BUN: 19 mg/dL (ref 6–23)
CO2: 25 mEq/L (ref 19–32)
Calcium: 8.8 mg/dL (ref 8.4–10.5)
Chloride: 100 mEq/L (ref 96–112)
Creatinine, Ser: 1.24 mg/dL (ref 0.50–1.35)
GFR calc Af Amer: 67 mL/min — ABNORMAL LOW (ref >90)
GFR calc non Af Amer: 58 mL/min — ABNORMAL LOW (ref >90)
Glucose, Bld: 167 mg/dL — ABNORMAL HIGH (ref 70–99)
Potassium: 4.7 mEq/L (ref 3.7–5.3)
Sodium: 136 mEq/L — ABNORMAL LOW (ref 137–147)

## 2013-08-05 NOTE — Progress Notes (Signed)
2 Days Post-Op  Subjective: Had a rough night with n/v.  No flatus or BM.  Feels bloated.  Objective: Vital signs in last 24 hours: Temp:  [97.7 F (36.5 C)-98.3 F (36.8 C)] 97.7 F (36.5 C) (05/03 0641) Pulse Rate:  [74-79] 77 (05/03 0641) Resp:  [16-18] 18 (05/03 0641) BP: (120-171)/(69-91) 155/90 mmHg (05/03 0641) SpO2:  [88 %-94 %] 93 % (05/03 0641) Last BM Date: 08/02/13  Intake/Output from previous day: 05/02 0701 - 05/03 0700 In: 1035 [P.O.:120; I.V.:915] Out: 1150 [Urine:1150] Intake/Output this shift:    PE: General- In NAD Abdomen-slight firm and distended, incisions clean and intact, hypoactive bowel sounds.  Lab Results:   Recent Labs  08/03/13 0520 08/04/13 0436  WBC 5.9 6.9  HGB 13.8 13.1  HCT 40.6 39.4  PLT 172 151   BMET  Recent Labs  08/04/13 0436 08/05/13 0502  NA 134* 136*  K 4.8 4.7  CL 102 100  CO2 22 25  GLUCOSE 125* 167*  BUN 26* 19  CREATININE 1.43* 1.24  CALCIUM 8.2* 8.8   PT/INR No results found for this basename: LABPROT, INR,  in the last 72 hours Comprehensive Metabolic Panel:    Component Value Date/Time   NA 136* 08/05/2013 0502   NA 134* 08/04/2013 0436   K 4.7 08/05/2013 0502   K 4.8 08/04/2013 0436   CL 100 08/05/2013 0502   CL 102 08/04/2013 0436   CO2 25 08/05/2013 0502   CO2 22 08/04/2013 0436   BUN 19 08/05/2013 0502   BUN 26* 08/04/2013 0436   CREATININE 1.24 08/05/2013 0502   CREATININE 1.43* 08/04/2013 0436   CREATININE 0.97 09/02/2011 0909   GLUCOSE 167* 08/05/2013 0502   GLUCOSE 125* 08/04/2013 0436   CALCIUM 8.8 08/05/2013 0502   CALCIUM 8.2* 08/04/2013 0436   AST 15 08/03/2013 0042   AST 19 11/01/2011 1600   ALT 13 08/03/2013 0042   ALT 17 11/01/2011 1600   ALKPHOS 37* 08/03/2013 0042   ALKPHOS 53 11/01/2011 1600   BILITOT 1.2 08/03/2013 0042   BILITOT 0.4 11/01/2011 1600   PROT 7.0 08/03/2013 0042   PROT 7.3 11/01/2011 1600   ALBUMIN 3.8 08/03/2013 0042   ALBUMIN 4.1 11/01/2011 1600     Studies/Results: Dg Chest Port 1  View  08/03/2013   CLINICAL DATA:  Postoperative shortness of breath, getting breathing treatment now, history hypertension  EXAM: PORTABLE CHEST - 1 VIEW  COMPARISON:  Portable exam 1058 hr compared to 11/03/2011  FINDINGS: Normal heart size, mediastinal contours and pulmonary vascularity for technique.  Subsegmental atelectasis at LEFT base.  Central bronchitic changes.  No acute infiltrate, pleural effusion or pneumothorax.  IMPRESSION: Bronchitic changes with subsegmental atelectasis at LEFT lung base.   Electronically Signed   By: Ulyses SouthwardMark  Boles M.D.   On: 08/03/2013 11:28    Anti-infectives: Anti-infectives   Start     Dose/Rate Route Frequency Ordered Stop   08/03/13 1200  Ampicillin-Sulbactam (UNASYN) 3 g in sodium chloride 0.9 % 100 mL IVPB     3 g 100 mL/hr over 60 Minutes Intravenous Every 6 hours 08/03/13 1114     08/03/13 0445  [MAR Hold]  cefTRIAXone (ROCEPHIN) 2 g in dextrose 5 % 50 mL IVPB  Status:  Discontinued     (On MAR Hold since 08/03/13 0936)  Comments:  Pharmacy may adjust dosing strength / duration / interval for maximal efficacy   2 g 100 mL/hr over 30 Minutes Intravenous Daily 08/03/13 0342 08/03/13  1114   08/03/13 0445  [MAR Hold]  metroNIDAZOLE (FLAGYL) IVPB 500 mg  Status:  Discontinued     (On MAR Hold since 08/03/13 0936)   500 mg 100 mL/hr over 60 Minutes Intravenous 4 times per day 08/03/13 0342 08/03/13 1114      Assessment Principal Problem:   Acute appendicitis with generalized peritonitis, perforation s/p lap appendectomy 08/03/13  Postop ileus    LOS: 2 days   Plan: Decrease diet.  Continue abxs.  Wait for bowel function to return.   Jim Desanctisodd J Maneh Sieben 08/05/2013

## 2013-08-06 MED ORDER — SODIUM CHLORIDE 0.9 % IV SOLN: 250.0000 mL | INTRAVENOUS | Status: DC | PRN

## 2013-08-06 MED ORDER — SODIUM CHLORIDE 0.9 % IJ SOLN: 3.0000 mL | Freq: Two times a day (BID) | INTRAMUSCULAR | Status: DC

## 2013-08-06 MED ORDER — LACTATED RINGERS IV BOLUS (SEPSIS): 1000.0000 mL | Freq: Three times a day (TID) | INTRAVENOUS | Status: AC | PRN

## 2013-08-06 MED ORDER — SODIUM CHLORIDE 0.9 % IJ SOLN: 3.0000 mL | INTRAMUSCULAR | Status: DC | PRN

## 2013-08-06 MED ORDER — SODIUM CHLORIDE 0.9 % IJ SOLN
3.0000 mL | Freq: Two times a day (BID) | INTRAMUSCULAR | Status: DC
  Administered 2013-08-06: 3 mL via INTRAVENOUS

## 2013-08-06 MED ORDER — LACTATED RINGERS IV BOLUS (SEPSIS): 1000.0000 mL | Freq: Three times a day (TID) | INTRAVENOUS | Status: DC | PRN

## 2013-08-06 NOTE — Progress Notes (Signed)
3 Days Post-Op  Subjective: Pain decreasing. Mild bilateral pain in lower abdomen when getting up No nausea this morning Tolerating ice chips well. Hopes to eat soon.  Objective: Vital signs in last 24 hours: Temp:  [97.8 F (36.6 C)-98.4 F (36.9 C)] 98.2 F (36.8 C) (05/04 0552) Pulse Rate:  [70-80] 80 (05/04 0552) Resp:  [16-18] 17 (05/04 0552) BP: (136-150)/(67-77) 150/67 mmHg (05/04 0552) SpO2:  [90 %-96 %] 90 % (05/04 0552) Last BM Date: 08/05/13  Intake/Output from previous day: 05/03 0701 - 05/04 0700 In: 2360 [I.V.:1760; IV Piggyback:600] Out: 1000 [Urine:1000] Intake/Output this shift: Urination approx every 2 hours. No hematuria seen   Excretion Flatus: Y BM: Diarrhea 2x this morning, no hematochezia  PE: Gen:  Alert, NAD, pleasant Card:  RRR, no M/G/R heard, no carotid bruits Pulm:  Clear to ausculation Abd: Slightly firm, distended, incisions clean and dry with no sign of infection, +BS, mild tenderness upon palpation at incision sites Extremities:  No erythema,  edema, or tenderness, full sensation   Lab Results:   Recent Labs  08/04/13 0436  WBC 6.9  HGB 13.1  HCT 39.4  PLT 151   BMET  Recent Labs  08/04/13 0436 08/05/13 0502  NA 134* 136*  K 4.8 4.7  CL 102 100  CO2 22 25  GLUCOSE 125* 167*  BUN 26* 19  CREATININE 1.43* 1.24  CALCIUM 8.2* 8.8   PT/INR No results found for this basename: LABPROT, INR,  in the last 72 hours Comprehensive Metabolic Panel:    Component Value Date/Time   NA 136* 08/05/2013 0502   NA 134* 08/04/2013 0436   K 4.7 08/05/2013 0502   K 4.8 08/04/2013 0436   CL 100 08/05/2013 0502   CL 102 08/04/2013 0436   CO2 25 08/05/2013 0502   CO2 22 08/04/2013 0436   BUN 19 08/05/2013 0502   BUN 26* 08/04/2013 0436   CREATININE 1.24 08/05/2013 0502   CREATININE 1.43* 08/04/2013 0436   CREATININE 0.97 09/02/2011 0909   GLUCOSE 167* 08/05/2013 0502   GLUCOSE 125* 08/04/2013 0436   CALCIUM 8.8 08/05/2013 0502   CALCIUM 8.2* 08/04/2013 0436   AST 15 08/03/2013 0042   AST 19 11/01/2011 1600   ALT 13 08/03/2013 0042   ALT 17 11/01/2011 1600   ALKPHOS 37* 08/03/2013 0042   ALKPHOS 53 11/01/2011 1600   BILITOT 1.2 08/03/2013 0042   BILITOT 0.4 11/01/2011 1600   PROT 7.0 08/03/2013 0042   PROT 7.3 11/01/2011 1600   ALBUMIN 3.8 08/03/2013 0042   ALBUMIN 4.1 11/01/2011 1600     Studies/Results: No results found.  Anti-infectives: Anti-infectives   Start     Dose/Rate Route Frequency Ordered Stop   08/03/13 1200  Ampicillin-Sulbactam (UNASYN) 3 g in sodium chloride 0.9 % 100 mL IVPB     3 g 100 mL/hr over 60 Minutes Intravenous Every 6 hours 08/03/13 1114     08/03/13 0445  [MAR Hold]  cefTRIAXone (ROCEPHIN) 2 g in dextrose 5 % 50 mL IVPB  Status:  Discontinued     (On MAR Hold since 08/03/13 0936)  Comments:  Pharmacy may adjust dosing strength / duration / interval for maximal efficacy   2 g 100 mL/hr over 30 Minutes Intravenous Daily 08/03/13 0342 08/03/13 1114   08/03/13 0445  [MAR Hold]  metroNIDAZOLE (FLAGYL) IVPB 500 mg  Status:  Discontinued     (On MAR Hold since 08/03/13 0936)   500 mg 100 mL/hr over 60 Minutes  Intravenous 4 times per day 08/03/13 0342 08/03/13 1114      Assessment Principal Problem:   Acute appendicitis with generalized peritonitis, perforation s/p lap appendectomy 08/03/13 Patient improving  Plan Diet- Advance to full liquid diet, discontinue IV fluids, use PRN Mobilization- continue walking 5+ times per day Cardiovascular- continue SCD's Continue antibiotics     LOS: 3 days      Atilano Medianaron T Amiree No 08/06/2013

## 2013-08-06 NOTE — Progress Notes (Signed)
Patient feeling better.  Still somewhat distended.  Flatus and bowel movements I/O +.  3 with restarting by mouth.  Ambulate.  Suspect will require antibiotics another week.  Discussed with operating surgeon, Dr. Gerrit FriendsGerkin

## 2013-08-07 ENCOUNTER — Encounter (HOSPITAL_COMMUNITY): Payer: Self-pay | Admitting: Surgery

## 2013-08-07 NOTE — Discharge Summary (Signed)
Physician Discharge Summary  Patient ID: [name] Juarez MRN: [id number] DOB/AGE: 69/05/1945 69 y.o.  Admit date: 08/03/2013 Discharge date: 08/08/2013  Admission Diagnoses:  Appendicitis Hypertension Hx of hyperlipidemia Hx of Tuberculosis at age 89 Arthritis S/p AAA repair  10/2012 Hx of Tobacco use, 45 years, quit 11/2011 History of  Etoh Use Mild renal insuffiencey    Discharge Diagnoses:  Perforated appendicitis with peritonitis Hypertension Hx of hyperlipidemia Hx of Tuberculosis at age 89 Arthritis S/p AAA repair  10/2012 Hx of Tobacco use, 45 years, quit 11/2011 History of  Etoh Use Mild renal insuffiencey (resolving)   Principal Problem:   Acute appendicitis with peritonitis, perforation s/p lap appy 08/03/2013 Active Problems:   Hypertension   PROCEDURES: Laparoscopic appendectomy, 08/03/13, Dr. Alean Rinneodd Gerkin    Hospital Course: Pleasant gentleman. He comes today with [place]. [place] He awoke yesterday morning with sharp abdominal pain. Prolonged nausea vomiting as well. Pain seems to be more upper and diffuse. No sick contacts or travel history. He does have a history of endovascular repair of an abdominal aneurysm 2013. Denies any change in his ability to walk. No radiation of pain down into his leg or paresthesias.  Because of worsening symptoms, he came into the emergency room. Pain is now shifted more towards the lower abdomen. Right seems to be worse than left. Examination CT scan findings suspicious for appendicitis. Surgical consultation requested.  He has had a colonoscopy in the distant past with no major polyps or other abnormalities. No personal nor family history of GI/colon cancer, inflammatory bowel disease, irritable bowel syndrome, allergy such as Celiac Sprue, dietary/dairy problems, colitis, ulcers nor gastritis. No recent sick contacts/gastroenteritis. No travel outside the country. No changes in diet. No dysphagia to solids or  liquids. No significant heartburn or reflux. No hematochezia, hematemesis, coffee ground emesis. No evidence of prior gastric/peptic ulceration. He was seen and admitted by Dr. Michaell CowingGross.  He was seen and taken to the OR, later that morning.  He was found to have a perforated appendix with peritonitis.  He had a post op ileus and was slow to have diet advanced. He was maintained on IV Unasyn.  He is afebrile, BP is stable.  Down at night.  Labs show some elevated glucose and he had some mild renal insuffiencey on admit that is resolving. Pathology:  Appendix, Other than Incidental - ACUTE APPENDICITIS AND PERIAPPENDICITIS. He has been advanced to a bland diet on 08/07/13.  If he does well we will let him go home in the AM.  We plan 10 days of antibiotics total.  He will follow up with Dr. Gerrit FriendsGerkin in 2-3 weeks.  CBC    Component Value Date/Time   WBC 6.9 08/04/2013 0436   RBC 4.04* 08/04/2013 0436   HGB 13.1 08/04/2013 0436   HCT 39.4 08/04/2013 0436   PLT 151 08/04/2013 0436   MCV 97.5 08/04/2013 0436   MCH 32.4 08/04/2013 0436   MCHC 33.2 08/04/2013 0436   RDW 13.7 08/04/2013 0436   LYMPHSABS 0.6* 08/03/2013 0042   MONOABS 0.3 08/03/2013 0042   EOSABS 0.0 08/03/2013 0042   BASOSABS 0.0 08/03/2013 0042   CMP     Component Value Date/Time   NA 136* 08/05/2013 0502   K 4.7 08/05/2013 0502   CL 100 08/05/2013 0502   CO2 25 08/05/2013 0502   GLUCOSE 167* 08/05/2013 0502   BUN 19 08/05/2013 0502   CREATININE 1.24 08/05/2013 0502   CREATININE 0.97 09/02/2011  [id number]   CALCIUM 8.8 08/05/2013 0502   PROT 7.0 08/03/2013 0042   ALBUMIN 3.8 08/03/2013 0042   AST 15 08/03/2013 0042   ALT 13 08/03/2013 0042   ALKPHOS 37* 08/03/2013 0042   BILITOT 1.2 08/03/2013 0042   GFRNONAA 58* 08/05/2013 0502   GFRAA 67* 08/05/2013 0502           Disposition: 01-Home or Self Care       Future Appointments Provider Department Dept Phone   12/18/2013 10:30 AM [facility] [place] [facility] [phone]   Liquids only 4 hours  prior to your exam. Any medications can be taken as usual. Please arrive 15 min prior to your scheduled exam time.   12/18/2013 11:45 AM Pryor OchoaJames D Lawson, MD [facility] [place] [phone]       Medication List         acetaminophen 325 MG tablet  Commonly known as:  TYLENOL  Take 2 tablets (650 mg total) by mouth every 4 (four) hours as needed for mild pain, fever or headache (Do not take more than 4000 mg of Tylenol (acetaminophen) daily.  This is in your prescription pain medicine.).     amoxicillin-clavulanate 875-125 MG per tablet  Commonly known as:  AUGMENTIN  Take 1 tablet by mouth every 12 (twelve) hours.     aspirin EC 81 MG tablet  Take 81 mg by mouth daily.     HYDROcodone-acetaminophen 5-325 MG per tablet  Commonly known as:  NORCO/VICODIN  Take 1-2 tablets by mouth every 4 (four) hours as needed for moderate pain.     losartan 25 MG tablet  Commonly known as:  COZAAR  Take 25 mg by mouth daily.     saccharomyces boulardii 250 MG capsule  Commonly known as:  FLORASTOR  Take one capsule twice a day, you can buy this over the counter.     sildenafil 100 MG tablet  Commonly known as:  VIAGRA  Take 100 mg by mouth daily as needed. For erectile dysfunction       Follow-up Information   Follow up with Velora HecklerGERKIN,TODD M, MD. Schedule an appointment as soon as possible for a visit in 2 weeks.   Specialty:  General Surgery   Contact information:   [address] [place] [address] [phone]       Follow up with Johny BlamerHARRIS, WILLIAM, MD. Schedule an appointment as soon as possible for a visit in 2 weeks. (As needed.  Let him know you had surgery and follow up for medical issues.  Your glucose and kidney function were a little abnormal while you were here so let them recheck you after your back to normal)    Specialty:  Family Medicine   Contact information:   [address] [place] [address] [phone]        Signed: Sherrie GeorgeWillard Maly Lemarr 08/08/2013, 9:56 AM

## 2013-08-07 NOTE — Progress Notes (Signed)
4 Days Post-Op  Subjective: Feels he is improving No longer nauseated Pain almost gone. No more abdominal pain when getting up. Walked through hallways 4x yesterday Tolerating full liquid diet, feels ready to try solids Patient states he feels ready to go home   Objective: Vital signs in last 24 hours: Temp:  [98.2 F (36.8 C)-99.1 F (37.3 C)] 99.1 F (37.3 C) (05/05 0644) Pulse Rate:  [60-73] 60 (05/05 0644) Resp:  [16-18] 18 (05/05 0644) BP: (108-149)/(65-79) 108/65 mmHg (05/05 0644) SpO2:  [91 %-93 %] 92 % (05/05 0644) Last BM Date: 08/06/13  Intake/Output from previous day: 05/04 0701 - 05/05 0700 In: 940 [P.O.:440; IV Piggyback:500] Out: 0  Intake/Output this shift: Urination approx every 2 hours. No hematuria seen   Excretion Flatus: Y BM: 4x from midnight to morning. Has improved from watery to runny, no hematochezia.   PE: Gen:  Alert, NAD, pleasant Card:  RRR, no M/G/R heard, no carotid bruits Pulm:  Left lung clear, mild wheezing type sounds on expiration found on right anterior side. Denies dyspnea and painful breathing Abd: Soft, incisions clean and dry with no sign of infection, +BS, no tenderness upon palpation Extremities:  No erythema,  edema, or tenderness, full sensation   Lab Results:  No results found for this basename: WBC, HGB, HCT, PLT,  in the last 72 hours BMET  Recent Labs  08/05/13 0502  NA 136*  K 4.7  CL 100  CO2 25  GLUCOSE 167*  BUN 19  CREATININE 1.24  CALCIUM 8.8   PT/INR No results found for this basename: LABPROT, INR,  in the last 72 hours Comprehensive Metabolic Panel:    Component Value Date/Time   NA 136* 08/05/2013 0502   NA 134* 08/04/2013 0436   K 4.7 08/05/2013 0502   K 4.8 08/04/2013 0436   CL 100 08/05/2013 0502   CL 102 08/04/2013 0436   CO2 25 08/05/2013 0502   CO2 22 08/04/2013 0436   BUN 19 08/05/2013 0502   BUN 26* 08/04/2013 0436   CREATININE 1.24 08/05/2013 0502   CREATININE 1.43* 08/04/2013 0436   CREATININE 0.97  09/02/2011 0909   GLUCOSE 167* 08/05/2013 0502   GLUCOSE 125* 08/04/2013 0436   CALCIUM 8.8 08/05/2013 0502   CALCIUM 8.2* 08/04/2013 0436   AST 15 08/03/2013 0042   AST 19 11/01/2011 1600   ALT 13 08/03/2013 0042   ALT 17 11/01/2011 1600   ALKPHOS 37* 08/03/2013 0042   ALKPHOS 53 11/01/2011 1600   BILITOT 1.2 08/03/2013 0042   BILITOT 0.4 11/01/2011 1600   PROT 7.0 08/03/2013 0042   PROT 7.3 11/01/2011 1600   ALBUMIN 3.8 08/03/2013 0042   ALBUMIN 4.1 11/01/2011 1600     Studies/Results: No results found.  Anti-infectives: Anti-infectives   Start     Dose/Rate Route Frequency Ordered Stop   08/03/13 1200  Ampicillin-Sulbactam (UNASYN) 3 g in sodium chloride 0.9 % 100 mL IVPB     3 g 100 mL/hr over 60 Minutes Intravenous Every 6 hours 08/03/13 1114 08/08/13 1159   08/03/13 0445  [MAR Hold]  cefTRIAXone (ROCEPHIN) 2 g in dextrose 5 % 50 mL IVPB  Status:  Discontinued     (On MAR Hold since 08/03/13 0936)  Comments:  Pharmacy may adjust dosing strength / duration / interval for maximal efficacy   2 g 100 mL/hr over 30 Minutes Intravenous Daily 08/03/13 0342 08/03/13 1114   08/03/13 0445  [MAR Hold]  metroNIDAZOLE (FLAGYL) IVPB 500 mg  Status:  Discontinued     (On MAR Hold since 08/03/13 0936)   500 mg 100 mL/hr over 60 Minutes Intravenous 4 times per day 08/03/13 0342 08/03/13 1114      Assessment Principal Problem:   Acute appendicitis with generalized peritonitis, perforation s/p lap appendectomy 08/03/13 Patient improving  Plan Diet- Advance to bland diet Mobilization- continue walking 5+ times per day Cardiovascular- continue SCD's Meds- Continue antibiotics Other- Take shower     LOS: 4 days      Atilano MedianAaron T Tahirah Sara PA-S [facility] 08/07/2013

## 2013-08-07 NOTE — Progress Notes (Signed)
Ileus resolving Agree w adv diet  D/C patient from hospital when patient meets criteria (anticipate in 0-1 day(s)):  Tolerating oral intake well Ambulating in walkways Adequate pain control without IV medications Urinating  Having flatus  Pt will need ABX x 7 days more at d/c.  D/w Dr Gerrit FriendsGerkin  Ardeth SportsmanSteven C. Genesee Nase, M.D., F.A.C.S. [facility] [address] [place] [address] [phone] Main / Paging

## 2013-08-08 DIAGNOSIS — I10 Essential (primary) hypertension: Secondary | ICD-10-CM | POA: Diagnosis present

## 2013-08-08 DIAGNOSIS — N179 Acute kidney failure, unspecified: Secondary | ICD-10-CM

## 2013-08-08 MED ORDER — AMOXICILLIN-POT CLAVULANATE 875-125 MG PO TABS: 1.0000 | ORAL_TABLET | Freq: Two times a day (BID) | ORAL | Status: DC

## 2013-08-08 MED ORDER — SACCHAROMYCES BOULARDII 250 MG PO CAPS
250.0000 mg | ORAL_CAPSULE | Freq: Two times a day (BID) | ORAL | Status: DC
  Administered 2013-08-08: 250 mg via ORAL
  Filled 2013-08-08 (×2): qty 1

## 2013-08-08 MED ORDER — SACCHAROMYCES BOULARDII 250 MG PO CAPS: ORAL_CAPSULE | ORAL | Status: DC

## 2013-08-08 MED ORDER — HYDROCODONE-ACETAMINOPHEN 5-325 MG PO TABS: 1.0000 | ORAL_TABLET | ORAL | Status: DC | PRN

## 2013-08-08 MED ORDER — AMOXICILLIN-POT CLAVULANATE 875-125 MG PO TABS
1.0000 | ORAL_TABLET | Freq: Two times a day (BID) | ORAL | Status: DC
  Administered 2013-08-08: 1 via ORAL
  Filled 2013-08-08 (×2): qty 1

## 2013-08-08 MED ORDER — ACETAMINOPHEN 325 MG PO TABS: 650.0000 mg | ORAL_TABLET | ORAL | Status: DC | PRN

## 2013-08-08 NOTE — Discharge Summary (Signed)
Close followup.  Perforation w gangrene = increased risk for abscess formation.  Hence, prolonged antibiotic administration.  Oral antibiotics another 7 days.  He is motivated to be more active and ambulate.  Most likely would benefit from seeing primary care physician soon

## 2013-08-08 NOTE — Progress Notes (Signed)
Back to his usual level abdominal obesity.  Feeling better and tolerating solid food.  Okay to discharge with close followup.  Antibiotics PO x7 more days

## 2013-08-08 NOTE — Progress Notes (Signed)
5 Days Post-Op  Subjective: He looks good and he and his wife want to go home.  He looks a bit distended to me but he says he is at his base line.  Tolerating diet and passing gas. Some stool is very loose and some is almost normal.   Objective: Vital signs in last 24 hours: Temp:  [97.8 F (36.6 C)-99 F (37.2 C)] 99 F (37.2 C) (05/06 0545) Pulse Rate:  [62-63] 62 (05/06 0545) Resp:  [18-19] 19 (05/06 0545) BP: (132-148)/(66-84) 132/73 mmHg (05/06 0545) SpO2:  [91 %-99 %] 91 % (05/06 0545) Last BM Date: 08/07/13 480 PO recorded. 6 stools recorded yesterday Soft diet Afebrile, VSS No labs Intake/Output from previous day: 05/05 0701 - 05/06 0700 In: 480 [P.O.:480] Out: 0  Intake/Output this shift:    General appearance: alert, cooperative and no distress Resp: clear to auscultation bilaterally GI: soft, sore, port sites look fine.  he has some blistering under port site at the umbilicus.  I told him that the skin will mostllikely come off with the steri strips. +BS, he seems mildly distended.  Lab Results:  No results found for this basename: WBC, HGB, HCT, PLT,  in the last 72 hours  BMET No results found for this basename: NA, K, CL, CO2, GLUCOSE, BUN, CREATININE, CALCIUM,  in the last 72 hours PT/INR No results found for this basename: LABPROT, INR,  in the last 72 hours   Recent Labs Lab 08/03/13 0042  AST 15  ALT 13  ALKPHOS 37*  BILITOT 1.2  PROT 7.0  ALBUMIN 3.8     Lipase     Component Value Date/Time   LIPASE 26 08/03/2013 0042     Studies/Results: No results found.  Medications: . enoxaparin (LOVENOX) injection  40 mg Subcutaneous Q24H  . pantoprazole  40 mg Oral Daily    Assessment/Plan Perforated appendicitis with peritonitis  S/p Laparoscopic appendectomy, 08/03/13, Dr. Darnell Levelodd Juarez  Hypertension  Hx of hyperlipidemia  Hx of Tuberculosis at age 89  Arthritis  S/p AAA repair 10/2012  Hx of Tobacco use, 45 years, quit 11/2011  History of  Etoh Use  Mild renal insuffiencey (resolving)   Plan:  He would like to go home.  He has had 6 days of antibiotics, we plan to send him home on 4 more for a total of 10.  Add probiotics.  Follow up in 2 weeks sooner if he has an issue.  LOS: 5 days    Sherrie GeorgeWillard Envy Juarez 08/08/2013

## 2013-08-08 NOTE — Discharge Instructions (Signed)
Laparoscopic Appendectomy °Care After °Refer to this sheet in the next few weeks. These instructions provide you with information on caring for yourself after your procedure. Your caregiver may also give you more specific instructions. Your treatment has been planned according to current medical practices, but problems sometimes occur. Call your caregiver if you have any problems or questions after your procedure. °HOME CARE INSTRUCTIONS °· Do not drive while taking narcotic pain medicines. °· Use stool softener if you become constipated from your pain medicines. °· Change your bandages (dressings) as directed. °· Keep your wounds clean and dry. You may wash the wounds gently with soap and water. Gently pat the wounds dry with a clean towel. °· Do not take baths, swim, or use hot tubs for 10 days, or as instructed by your caregiver. °· Only take over-the-counter or prescription medicines for pain, discomfort, or fever as directed by your caregiver. °· You may continue your normal diet as directed. °· Do not lift more than 10 pounds (4.5 kg) or play contact sports for 3 weeks, or as directed. °· Slowly increase your activity after surgery. °· Take deep breaths to avoid getting a lung infection (pneumonia). °SEEK MEDICAL CARE IF: °· You have redness, swelling, or increasing pain in your wounds. °· You have pus coming from your wounds. °· You have drainage from a wound that lasts longer than 1 day. °· You notice a bad smell coming from the wounds or dressing. °· Your wound edges break open after stitches (sutures) have been removed. °· You notice increasing pain in the shoulders (shoulder strap areas) or near your shoulder blades. °· You develop dizzy episodes or fainting while standing. °· You develop shortness of breath. °· You develop persistent nausea or vomiting. °· You cannot control your bowel functions or lose your appetite. °· You develop diarrhea. °SEEK IMMEDIATE MEDICAL CARE IF:  °· You have a fever. °· You  develop a rash. °· You have difficulty breathing or sharp pains in your chest. °· You develop any reaction or side effects to medicines given. °MAKE SURE YOU: °· Understand these instructions. °· Will watch your condition. °· Will get help right away if you are not doing well or get worse. °Document Released: 03/22/2005 Document Revised: 06/14/2011 Document Reviewed: 09/29/2010 °ExitCare® Patient Information ©2014 [employer]. ° °CCS ______[facility] °LAPAROSCOPIC SURGERY: POST OP INSTRUCTIONS °Always review your discharge instruction sheet given to you by the facility where your surgery was performed. °IF YOU HAVE DISABILITY OR FAMILY LEAVE FORMS, YOU MUST BRING THEM TO THE OFFICE FOR PROCESSING.   °DO NOT GIVE THEM TO YOUR DOCTOR. ° °1. A prescription for pain medication may be given to you upon discharge.  Take your pain medication as prescribed, if needed.  If narcotic pain medicine is not needed, then you may take acetaminophen (Tylenol) or ibuprofen (Advil) as needed. °2. Take your usually prescribed medications unless otherwise directed. °3. If you need a refill on your pain medication, please contact your pharmacy.  They will contact our office to request authorization. Prescriptions will not be filled after 5pm or on week-ends. °4. You should follow a light diet the first few days after arrival home, such as soup and crackers, etc.  Be sure to include lots of fluids daily. °5. Most patients will experience some swelling and bruising in the area of the incisions.  Ice packs will help.  Swelling and bruising can take several days to resolve.  °6. It is common to experience   some constipation if taking pain medication after surgery.  Increasing fluid intake and taking a stool softener (such as Colace) will usually help or prevent this problem from occurring.  A mild laxative (Milk of Magnesia or Miralax) should be taken according to package instructions if there are no bowel movements  after 48 hours. °7. Unless discharge instructions indicate otherwise, you may remove your bandages 24-48 hours after surgery, and you may shower at that time.  You may have steri-strips (small skin tapes) in place directly over the incision.  These strips should be left on the skin for 7-10 days.  If your surgeon used skin glue on the incision, you may shower in 24 hours.  The glue will flake off over the next 2-3 weeks.  Any sutures or staples will be removed at the office during your follow-up visit. °8. ACTIVITIES:  You may resume regular (light) daily activities beginning the next day--such as daily self-care, walking, climbing stairs--gradually increasing activities as tolerated.  You may have sexual intercourse when it is comfortable.  Refrain from any heavy lifting or straining until approved by your doctor. °a. You may drive when you are no longer taking prescription pain medication, you can comfortably wear a seatbelt, and you can safely maneuver your car and apply brakes. °b. RETURN TO WORK:  __________________________________________________________ °9. You should see your doctor in the office for a follow-up appointment approximately 2-3 weeks after your surgery.  Make sure that you call for this appointment within a day or two after you arrive home to insure a convenient appointment time. °10. OTHER INSTRUCTIONS: __________________________________________________________________________________________________________________________ __________________________________________________________________________________________________________________________ °WHEN TO CALL YOUR DOCTOR: °1. Fever over 101.0 °2. Inability to urinate °3. Continued bleeding from incision. °4. Increased pain, redness, or drainage from the incision. °5. Increasing abdominal pain ° °The clinic staff is available to answer your questions during regular business hours.  Please don’t hesitate to call and ask to speak to one of the  nurses for clinical concerns.  If you have a medical emergency, go to the nearest emergency room or call 911.  A surgeon from [facility] is always on call at the hospital. °[address], [address], [place], [place]  [address] ? [address] [address], [place], [place]   [address] °[phone] ? [phone] ? FAX [phone] °Web site: [email or web address] ° °

## 2013-09-12 ENCOUNTER — Encounter (INDEPENDENT_AMBULATORY_CARE_PROVIDER_SITE_OTHER): Payer: Self-pay | Admitting: Surgery

## 2013-09-12 ENCOUNTER — Ambulatory Visit (INDEPENDENT_AMBULATORY_CARE_PROVIDER_SITE_OTHER): Payer: Medicare Other | Admitting: Surgery

## 2013-09-12 VITALS — BP 126/78 | HR 88 | Temp 97.3°F | Resp 18 | Ht 69.0 in | Wt 158.0 lb

## 2013-09-12 DIAGNOSIS — K352 Acute appendicitis with generalized peritonitis, without abscess: Secondary | ICD-10-CM

## 2013-09-12 NOTE — Patient Instructions (Signed)
  CARE OF INCISION   Apply cocoa butter/vitamin E cream (Palmer's brand) to your incision 2 - 3 times daily.  Massage cream into incision for one minute with each application.  Use sunscreen (50 SPF or higher) for first 6 months after surgery if area is exposed to sun.  You may alternate Mederma or other scar reducing cream with cocoa butter cream if desired.       Dody Smartt M. Reyanne Hussar, MD, FACS      [facility]      Office: [phone]    

## 2013-09-12 NOTE — Progress Notes (Signed)
General Surgery [facility]  Chief Complaint  Patient presents with  . Routine Post Op    lap appendectomy for perforated appendicitis on 08/03/2013    HISTORY: Patient is a 68 year old male who underwent laparoscopic appendectomy for perforated appendicitis on 08/03/2013. Postoperatively he was treated with 10 days of antibiotics. His recovery has been relatively uneventful.  EXAM: Abdomen is soft, nontender, without distention. Surgical wounds are well healed with no sign of infection or herniation. Right lower quadrant is soft and nontender without mass.  IMPRESSION: Status post laparoscopic appendectomy for perforated appendicitis  PLAN: Patient will begin applying topical creams to his incisions. He is released to full activity without restriction.  Patient will return for surgical care as needed.  Velora Heckler, MD, FACS General & Endocrine Surgery [facility]   Visit Diagnoses: 1. Acute appendicitis with peritonitis, perforation s/p lap appy 08/03/2013

## 2013-12-17 ENCOUNTER — Encounter: Payer: Self-pay | Admitting: Vascular Surgery

## 2013-12-18 ENCOUNTER — Ambulatory Visit (INDEPENDENT_AMBULATORY_CARE_PROVIDER_SITE_OTHER): Payer: Medicare Other | Admitting: Vascular Surgery

## 2013-12-18 ENCOUNTER — Encounter: Payer: Self-pay | Admitting: Vascular Surgery

## 2013-12-18 ENCOUNTER — Ambulatory Visit
Admission: RE | Admit: 2013-12-18 | Discharge: 2013-12-18 | Disposition: A | Discharge status: Home / Self Care | Payer: Medicare Other | Source: Ambulatory Visit | Attending: Vascular Surgery | Admitting: Vascular Surgery

## 2013-12-18 VITALS — BP 129/75 | HR 71 | Resp 16 | Ht 69.0 in | Wt 161.0 lb

## 2013-12-18 DIAGNOSIS — Z48812 Encounter for surgical aftercare following surgery on the circulatory system: Secondary | ICD-10-CM

## 2013-12-18 DIAGNOSIS — I714 Abdominal aortic aneurysm, without rupture, unspecified: Secondary | ICD-10-CM

## 2013-12-18 MED ORDER — IOHEXOL 350 MG/ML SOLN
80.0000 mL | Freq: Once | INTRAVENOUS | Status: AC | PRN
  Administered 2013-12-18: 80 mL via INTRAVENOUS

## 2013-12-18 NOTE — Addendum Note (Signed)
Addended by: Sharee Pimple on: 12/18/2013 01:31 PM   Modules accepted: Orders

## 2013-12-18 NOTE — Progress Notes (Signed)
Subjective:     Patient ID: [name] Juarez, male   DOB: [birth year], 68 y.o.   MRN: [id number]  HPI this 68 year old male returns for continued followup regarding his abdominal aortic aneurysm stent graft repair. He has had no health issues other than a ruptured appendix in May of this year. He had a CT scan performed at that time which revealed a stent graft of the in good position. He has done well since the ruptured appendix surgery. He denies any lower extremity claudication symptoms.  Past Medical History  Diagnosis Date  . Hyperlipidemia   . Varicose veins   . Colon polyp 10/2006  . Erectile dysfunction   . Hypertension   . Tuberculosis     age 64  . Arthritis     History  Substance Use Topics  . Smoking status: Former Smoker -- 45 years    Types: Cigarettes    Quit date: 11/05/2011  . Smokeless tobacco: Never Used  . Alcohol Use: 15.0 oz/week    4 Glasses of wine, 21 Cans of beer per week    Family History  Problem Relation Age of Onset  . Tuberculosis Father   . Tuberculosis Sister     No Known Allergies  Current outpatient prescriptions:aspirin EC 81 MG tablet, Take 81 mg by mouth daily., Disp: , Rfl: ;  losartan (COZAAR) 25 MG tablet, Take 25 mg by mouth daily., Disp: , Rfl: ;  acetaminophen (TYLENOL) 325 MG tablet, Take 2 tablets (650 mg total) by mouth every 4 (four) hours as needed for mild pain, fever or headache (Do not take more than 4000 mg of Tylenol (acetaminophen) daily.  This is in your prescription pain medicine.)., Disp: , Rfl:  saccharomyces boulardii (FLORASTOR) 250 MG capsule, Take one capsule twice a day, you can buy this over the counter., Disp: , Rfl: ;  sildenafil (VIAGRA) 100 MG tablet, Take 100 mg by mouth daily as needed. For erectile dysfunction, Disp: , Rfl:   BP 129/75  Pulse 71  Resp 16  Ht  (1.753 m)  Wt 161 lb (73.029 kg)  BMI 23.76 kg/m2  Body mass index is 23.76 kg/(m^2).           Review of Systems denies chest pain,  dyspnea on exertion, PND, orthopnea, hemoptysis, claudication. All other systems negative the complete review of systems    Objective:   Physical Exam BP 129/75  Pulse 71  Resp 16  Ht  (1.753 m)  Wt 161 lb (73.029 kg)  BMI 23.76 kg/m2  Gen.-alert and oriented x3 in no apparent distress HEENT normal for age Lungs no rhonchi or wheezing Cardiovascular regular rhythm no murmurs carotid pulses 3+ palpable no bruits audible Abdomen soft nontender no palpable masses Musculoskeletal free of  major deformities Skin clear -no rashes Neurologic normal Lower extremities 3+ femoral and dorsalis pedis pulses palpable bilaterally with no edema  Today I ordered CT angiogram of the abdomen and pelvis which are reviewed by computer. The stent graft is in excellent position with no evidence of endoleak. Maximum diameter is 4.1 x 4.2 which is unchanged.       Assessment:     Doing well with his aortic stent graft for abdominal aortic aneurysm with no evidence of endoleak and stable sac size    Plan:     Return in one year for duplex scan of aortic aneurysm and stent graft in our office.

## 2014-09-30 ENCOUNTER — Other Ambulatory Visit: Payer: Self-pay

## 2014-11-27 ENCOUNTER — Encounter: Payer: Self-pay | Admitting: Family

## 2014-11-28 ENCOUNTER — Ambulatory Visit (INDEPENDENT_AMBULATORY_CARE_PROVIDER_SITE_OTHER): Payer: Medicare Other | Admitting: Family

## 2014-11-28 ENCOUNTER — Encounter: Payer: Self-pay | Admitting: Family

## 2014-11-28 ENCOUNTER — Ambulatory Visit (HOSPITAL_COMMUNITY)
Admission: RE | Admit: 2014-11-28 | Discharge: 2014-11-28 | Disposition: A | Discharge status: Home / Self Care | Payer: Medicare Other | Source: Ambulatory Visit | Attending: Vascular Surgery | Admitting: Vascular Surgery

## 2014-11-28 VITALS — BP 124/80 | HR 65 | Temp 97.2°F | Resp 16 | Ht 69.0 in | Wt 166.0 lb

## 2014-11-28 DIAGNOSIS — Z48812 Encounter for surgical aftercare following surgery on the circulatory system: Secondary | ICD-10-CM | POA: Insufficient documentation

## 2014-11-28 DIAGNOSIS — Z4889 Encounter for other specified surgical aftercare: Secondary | ICD-10-CM | POA: Diagnosis not present

## 2014-11-28 DIAGNOSIS — I714 Abdominal aortic aneurysm, without rupture, unspecified: Secondary | ICD-10-CM

## 2014-11-28 DIAGNOSIS — Z87891 Personal history of nicotine dependence: Secondary | ICD-10-CM | POA: Diagnosis not present

## 2014-11-28 DIAGNOSIS — Z95828 Presence of other vascular implants and grafts: Secondary | ICD-10-CM | POA: Diagnosis not present

## 2014-11-28 DIAGNOSIS — R0989 Other specified symptoms and signs involving the circulatory and respiratory systems: Secondary | ICD-10-CM

## 2014-11-28 NOTE — Patient Instructions (Signed)
Varicose Veins Varicose veins are veins that have become enlarged and twisted. CAUSES This condition is the result of valves in the veins not working properly. Valves in the veins help return blood from the leg to the heart. If these valves are damaged, blood flows backwards and backs up into the veins in the leg near the skin. This causes the veins to become larger. People who are on their feet a lot, who are pregnant, or who are overweight are more likely to develop varicose veins. SYMPTOMS   Bulging, twisted-appearing, bluish veins, most commonly found on the legs.  Leg pain or a feeling of heaviness. These symptoms may be worse at the end of the day.  Leg swelling.  Skin color changes. DIAGNOSIS  Varicose veins can usually be diagnosed with an exam of your legs by your caregiver. He or she may recommend an ultrasound of your leg veins. TREATMENT  Most varicose veins can be treated at home.However, other treatments are available for people who have persistent symptoms or who want to treat the cosmetic appearance of the varicose veins. These include:  Laser treatment of very small varicose veins.  Medicine that is shot (injected) into the vein. This medicine hardens the walls of the vein and closes off the vein. This treatment is called sclerotherapy. Afterwards, you may need to wear clothing or bandages that apply pressure.  Surgery. HOME CARE INSTRUCTIONS   Do not stand or sit in one position for long periods of time. Do not sit with your legs crossed. Rest with your legs raised during the day.  Wear elastic stockings or support hose. Do not wear other tight, encircling garments around the legs, pelvis, or waist.  Walk as much as possible to increase blood flow.  Raise the foot of your bed at night with 2-inch blocks.  If you get a cut in the skin over the vein and the vein bleeds, lie down with your leg raised and press on it with a clean cloth until the bleeding stops. Then  place a bandage (dressing) on the cut. See your caregiver if it continues to bleed or needs stitches. SEEK MEDICAL CARE IF:   The skin around your ankle starts to break down.  You have pain, redness, tenderness, or hard swelling developing in your leg over a vein.  You are uncomfortable due to leg pain. Document Released: 12/30/2004 Document Revised: 06/14/2011 Document Reviewed: 05/18/2010 ExitCare Patient Information 2015 [employer]. This information is not intended to replace advice given to you by your health care provider. Make sure you discuss any questions you have with your health care provider.  

## 2014-11-28 NOTE — Progress Notes (Signed)
[facility] [place]  Established [facility]  History of Present Illness  [name] Juarez is a 69 y.o. (1945/10/13) male patient of Dr. Hart Rochester who returns today for followup regarding his abdominal aortic aneurysm stent graft repair on 11/03/11. He had a CT scan performed in May 2015 which revealed a stent graft of the in good position. He has done well since the ruptured appendix surgery. He denies any lower extremity claudication symptoms. Pt denies back or abdominal pain. Pt denies any family history of aneurysms.  Pt reports that he consumes less than 14 ETOH drinks/week.  Pt denies any history of stroke or TIA.   Pt Diabetic: No Pt smoker: former smoker, quit in 2013   Past Medical History  Diagnosis Date  . Hyperlipidemia   . Varicose veins   . Colon polyp 10/2006  . Erectile dysfunction   . Hypertension   . Tuberculosis     age 9  . Arthritis    Past Surgical History  Procedure Laterality Date  . Knee surgery Right   . Inguinal hernia repair Right   . Abdominal aortic endovascular stent graft  11/03/2011  . Laparoscopic appendectomy N/A 08/03/2013    Procedure: APPENDECTOMY LAPAROSCOPIC;  Surgeon: Velora Heckler, MD;  Location: [facility];  Service: General;  Laterality: N/A;  . Appendectomy     Social History Social History  Substance Use Topics  . Smoking status: Former Smoker -- 45 years    Types: Cigarettes    Quit date: 11/05/2011  . Smokeless tobacco: Never Used  . Alcohol Use: 15.0 oz/week    4 Glasses of wine, 21 Cans of beer per week   Family History Family History  Problem Relation Age of Onset  . Tuberculosis Father   . Tuberculosis Sister    Current Outpatient Prescriptions on File Prior to Visit  Medication Sig Dispense Refill  . acetaminophen (TYLENOL) 325 MG tablet Take 2 tablets (650 mg total) by mouth every 4 (four) hours as needed for mild pain, fever or headache (Do not take more than 4000 mg of Tylenol (acetaminophen) daily.  This is  in your prescription pain medicine.).    Marland Kitchen aspirin EC 81 MG tablet Take 81 mg by mouth daily.    Marland Kitchen losartan (COZAAR) 25 MG tablet Take 25 mg by mouth daily.    Marland Kitchen saccharomyces boulardii (FLORASTOR) 250 MG capsule Take one capsule twice a day, you can buy this over the counter.    . sildenafil (VIAGRA) 100 MG tablet Take 100 mg by mouth daily as needed. For erectile dysfunction     No current facility-administered medications on file prior to visit.   No Known Allergies   ROS: See HPI for pertinent positives and negatives.  Physical Examination  Filed Vitals:   11/28/14 0909  BP: 124/80  Pulse: 65  Temp: 97.2 F (36.2 C)  TempSrc: Oral  Resp: 16  Height:  (1.753 m)  Weight: 166 lb (75.297 kg)  SpO2: 98%   Body mass index is 24.5 kg/(m^2).  General: A&O x 3, WD.  Pulmonary: Sym exp, good air movt, CTAB, no rales, rhonchi, or wheezing.   Cardiac: RRR, Nl S1, S2, no murmur appreciated  Vascular: Vessel Right Left  Radial 2+Palpable 2+Palpable  Carotid audible without bruit audible without bruit  Aorta Not palpable N/A  Femoral 2+Palpable Not Palpable  Popliteal Not palpable Not palpable  PT Not Palpable, +Dopplerable Not Palpable,  not Dopplerable  DP Not Palpable, +Dopplerable Not Palpable, +  Dopplerable   Gastrointestinal: soft, NTND, -G/R, - HSM, - palpable masses, - CVAT B.  Musculoskeletal: M/S 5/5 throughout, extremities without ischemic changes except slightly cyanotic left great toe.  Neurologic: Pain and light touch intact in extremities, Motor exam as listed above   Non-Invasive Vascular Imaging  EVAR Duplex (D2ate: 11/28/2014) ABDOMINAL AORTA DUPLEX EVALUATION - POST ENDOVASCULAR REPAIR    INDICATION: Evaluation of endovascular abdominal repair of aortic aneurysm.    PREVIOUS INTERVENTION(S): Abdominal stent graft    DUPLEX EXAM:      DIAMETER AP (cm) DIAMETER TRANSVERSE (cm) VELOCITIES (cm/sec)  Aorta 3.92 3.90 188  Right Common Iliac Not  Visualized  Not Visualized    Left Common Iliac Not visualized  Not visualized     Comparison Study       Date DIAMETER AP (cm) DIAMETER TRANSVERSE (cm)  CT 4.1 4.2     ADDITIONAL FINDINGS: Technically difficult and limited exam due to overlying bowel gas.    IMPRESSION: Patent endovascular abdominal aortic repair with a maximum diameter of 3.92 x 3.90 cm.    Compared to the previous exam:  Slightly smaller diameter when compared to the last CT scan.     CTA Abd/Pelvis Duplex (Date: 12/18/13)  AAA sac size: 4.6 cm (review of records)  no endoleak detected   Medical Decision Making  [name] Juarez is a 69 y.o. male who presents s/p EVAR (Date: 11/03/11).  Pt is asymptomatic with decrease in sac size. Pedal pulses are not palpable, left great toe is slightly cyanotic, no non healing wounds.  I discussed with the patient the importance of surveillance of the endograft.  The next endograft duplex will be scheduled for 12 months.  The patient will follow up in 2-4 weeks with Dr. Hart Rochester with ABI's and discussion of more varicose veins on medial aspect right thigh. Face to face time with patient was 25 minutes. Over 50% of this time was spent on counseling and coordination of care.  I emphasized the importance of maximal medical management including strict control of blood pressure, blood glucose, and lipid levels, antiplatelet agents, obtaining regular exercise, and cessation of smoking.   Thank you for allowing [place] to participate in this patient's care.  Charisse March, RN, MSN, FNP-C [facility] [place] Office: [phone]  Clinic Physician: Darrick Penna  11/28/2014, 9:10 AM

## 2014-12-24 ENCOUNTER — Other Ambulatory Visit (HOSPITAL_COMMUNITY): Payer: Medicare Other

## 2014-12-24 ENCOUNTER — Ambulatory Visit: Payer: Medicare Other | Admitting: Family

## 2014-12-30 ENCOUNTER — Encounter: Payer: Self-pay | Admitting: Vascular Surgery

## 2014-12-31 ENCOUNTER — Ambulatory Visit (HOSPITAL_COMMUNITY)
Admission: RE | Admit: 2014-12-31 | Discharge: 2014-12-31 | Disposition: A | Discharge status: Home / Self Care | Payer: Medicare Other | Source: Ambulatory Visit | Attending: Vascular Surgery | Admitting: Vascular Surgery

## 2014-12-31 ENCOUNTER — Ambulatory Visit (INDEPENDENT_AMBULATORY_CARE_PROVIDER_SITE_OTHER): Payer: Medicare Other | Admitting: Vascular Surgery

## 2014-12-31 ENCOUNTER — Encounter: Payer: Self-pay | Admitting: Vascular Surgery

## 2014-12-31 VITALS — BP 129/79 | HR 62 | Ht 69.0 in | Wt 167.0 lb

## 2014-12-31 DIAGNOSIS — Z87891 Personal history of nicotine dependence: Secondary | ICD-10-CM | POA: Insufficient documentation

## 2014-12-31 DIAGNOSIS — E785 Hyperlipidemia, unspecified: Secondary | ICD-10-CM | POA: Diagnosis not present

## 2014-12-31 DIAGNOSIS — I83891 Varicose veins of right lower extremities with other complications: Secondary | ICD-10-CM | POA: Diagnosis not present

## 2014-12-31 DIAGNOSIS — Z4889 Encounter for other specified surgical aftercare: Secondary | ICD-10-CM | POA: Diagnosis not present

## 2014-12-31 DIAGNOSIS — R0989 Other specified symptoms and signs involving the circulatory and respiratory systems: Secondary | ICD-10-CM | POA: Diagnosis not present

## 2014-12-31 DIAGNOSIS — Z48812 Encounter for surgical aftercare following surgery on the circulatory system: Secondary | ICD-10-CM

## 2014-12-31 DIAGNOSIS — I10 Essential (primary) hypertension: Secondary | ICD-10-CM | POA: Insufficient documentation

## 2014-12-31 DIAGNOSIS — Z95828 Presence of other vascular implants and grafts: Secondary | ICD-10-CM | POA: Insufficient documentation

## 2014-12-31 NOTE — Progress Notes (Signed)
Subjective:     Patient ID: [name] Juarez, male   DOB: Sep 06, 1945, 69 y.o.   MRN: [id number]  HPI This 69 year old male is seen today in follow-up regarding recurrent varicose veins right leg. Patient has had laser ablation of the right great saphenous vein performed on 2 occasions most recently in January 2015. 1 week later the ultrasound confirmed complete closure of the right great saphenous vein. He has done well until about 2 months ago when he developed some recurrent varicosities in the medial aspect of the right thigh. These are not causing pain present time. He is having no edema distally. He's had no DVT thrombophlebitis or bleeding.   Past Medical History  Diagnosis Date  . Hyperlipidemia   . Varicose veins   . Colon polyp 10/2006  . Erectile dysfunction   . Hypertension   . Tuberculosis     age 89  . Arthritis     Social History  Substance Use Topics  . Smoking status: Former Smoker -- 45 years    Types: Cigarettes    Quit date: 11/05/2011  . Smokeless tobacco: Never Used  . Alcohol Use: 15.0 oz/week    4 Glasses of wine, 21 Cans of beer per week    Family History  Problem Relation Age of Onset  . Tuberculosis Father   . Tuberculosis Sister     No Known Allergies   Current outpatient prescriptions:  .  aspirin EC 81 MG tablet, Take 81 mg by mouth daily., Disp: , Rfl:  .  losartan (COZAAR) 25 MG tablet, Take 25 mg by mouth daily., Disp: , Rfl:  .  sildenafil (VIAGRA) 100 MG tablet, Take 100 mg by mouth daily as needed. For erectile dysfunction, Disp: , Rfl:  .  acetaminophen (TYLENOL) 325 MG tablet, Take 2 tablets (650 mg total) by mouth every 4 (four) hours as needed for mild pain, fever or headache (Do not take more than 4000 mg of Tylenol (acetaminophen) daily.  This is in your prescription pain medicine.). (Patient not taking: Reported on 11/28/2014), Disp: , Rfl:  .  saccharomyces boulardii (FLORASTOR) 250 MG capsule, Take one capsule twice a day, you can buy this  over the counter. (Patient not taking: Reported on 11/28/2014), Disp: , Rfl:   Filed Vitals:   12/31/14 1356  BP: 129/79  Pulse: 62  Height:  (1.753 m)  Weight: 167 lb (75.751 kg)  SpO2: 100%    Body mass index is 24.65 kg/(m^2).           Review of Systems Denies chest pain, dyspnea on exertion, PND, orthopnea, hemoptysis, claudication     Objective:   Physical Exam BP 129/79 mmHg  Pulse 62  Ht  (1.753 m)  Wt 167 lb (75.751 kg)  BMI 24.65 kg/m2  SpO2 100%   Gen. Well-developed well-nourished male in no apparent distress alert and oriented 3 Lungs no rhonchi or wheezing Right leg with bulging varicosities medial aspect of the proximal and mid thigh over the great saphenous vein. Minimal edema distally. Right foot well perfused. No active ulceration noted.    today I performed a bedside SonoSite ultrasound exam. The right great saphenous vein is totally closed from near the saphenofemoral junction to the distal thigh.     Assessment:      recurrent varicosities right medial thigh which are asymptomatic  successful persistent closure of right great saphenous vein performed for a second time in January 2015     Plan:  since patient is asymptomatic he does not desire any treatment of these recurrent varicosities. He will return next year for follow-up regarding his abdominal aortic aneurysm stent graft as previously scheduled

## 2015-11-28 ENCOUNTER — Encounter: Payer: Self-pay | Admitting: Family

## 2015-12-02 ENCOUNTER — Encounter: Payer: Self-pay | Admitting: Family

## 2015-12-02 ENCOUNTER — Ambulatory Visit (INDEPENDENT_AMBULATORY_CARE_PROVIDER_SITE_OTHER): Payer: Medicare Other | Admitting: Family

## 2015-12-02 ENCOUNTER — Ambulatory Visit (HOSPITAL_COMMUNITY)
Admission: RE | Admit: 2015-12-02 | Discharge: 2015-12-02 | Disposition: A | Discharge status: Home / Self Care | Payer: Medicare Other | Source: Ambulatory Visit | Attending: Family | Admitting: Family

## 2015-12-02 ENCOUNTER — Other Ambulatory Visit: Payer: Self-pay | Admitting: Family

## 2015-12-02 VITALS — BP 132/75 | HR 62 | Temp 97.4°F | Resp 18 | Ht 69.0 in | Wt 168.5 lb

## 2015-12-02 DIAGNOSIS — Z95828 Presence of other vascular implants and grafts: Secondary | ICD-10-CM | POA: Insufficient documentation

## 2015-12-02 DIAGNOSIS — Z4889 Encounter for other specified surgical aftercare: Secondary | ICD-10-CM | POA: Diagnosis not present

## 2015-12-02 DIAGNOSIS — Z87891 Personal history of nicotine dependence: Secondary | ICD-10-CM | POA: Diagnosis not present

## 2015-12-02 DIAGNOSIS — R0989 Other specified symptoms and signs involving the circulatory and respiratory systems: Secondary | ICD-10-CM | POA: Insufficient documentation

## 2015-12-02 DIAGNOSIS — I714 Abdominal aortic aneurysm, without rupture, unspecified: Secondary | ICD-10-CM

## 2015-12-02 DIAGNOSIS — I779 Disorder of arteries and arterioles, unspecified: Secondary | ICD-10-CM

## 2015-12-02 DIAGNOSIS — Z48812 Encounter for surgical aftercare following surgery on the circulatory system: Secondary | ICD-10-CM

## 2015-12-02 NOTE — Patient Instructions (Signed)
Peripheral Vascular Disease Peripheral vascular disease (PVD) is a disease of the blood vessels that are not part of your heart and brain. A simple term for PVD is poor circulation. In most cases, PVD narrows the blood vessels that carry blood from your heart to the rest of your body. This can result in a decreased supply of blood to your arms, legs, and internal organs, like your stomach or kidneys. However, it most often affects a person's lower legs and feet. There are two types of PVD.  Organic PVD. This is the more common type. It is caused by damage to the structure of blood vessels.  Functional PVD. This is caused by conditions that make blood vessels contract and tighten (spasm). Without treatment, PVD tends to get worse over time. PVD can also lead to acute ischemic limb. This is when an arm or limb suddenly has trouble getting enough blood. This is a medical emergency. CAUSES Each type of PVD has many different causes. The most common cause of PVD is buildup of a fatty material (plaque) inside of your arteries (atherosclerosis). Small amounts of plaque can break off from the walls of the blood vessels and become lodged in a smaller artery. This blocks blood flow and can cause acute ischemic limb. Other common causes of PVD include:  Blood clots that form inside of blood vessels.  Injuries to blood vessels.  Diseases that cause inflammation of blood vessels or cause blood vessel spasms.  Health behaviors and health history that increase your risk of developing PVD. RISK FACTORS  You may have a greater risk of PVD if you:  Have a family history of PVD.  Have certain medical conditions, including:  High cholesterol.  Diabetes.  High blood pressure (hypertension).  Coronary heart disease.  Past problems with blood clots.  Past injury, such as burns or a broken bone. These may have damaged blood vessels in your limbs.  Buerger disease. This is caused by inflamed blood  vessels in your hands and feet.  Some forms of arthritis.  Rare birth defects that affect the arteries in your legs.  Use tobacco.  Do not get enough exercise.  Are obese.  Are age 50 or older. SIGNS AND SYMPTOMS  PVD may cause many different symptoms. Your symptoms depend on what part of your body is not getting enough blood. Some common signs and symptoms include:  Cramps in your lower legs. This may be a symptom of poor leg circulation (claudication).  Pain and weakness in your legs while you are physically active that goes away when you rest (intermittent claudication).  Leg pain when at rest.  Leg numbness, tingling, or weakness.  Coldness in a leg or foot, especially when compared with the other leg.  Skin or hair changes. These can include:  Hair loss.  Shiny skin.  Pale or bluish skin.  Thick toenails.  Inability to get or maintain an erection (erectile dysfunction). People with PVD are more prone to developing ulcers and sores on their toes, feet, or legs. These may take longer than normal to heal. DIAGNOSIS Your health care provider may diagnose PVD from your signs and symptoms. The health care provider will also do a physical exam. You may have tests to find out what is causing your PVD and determine its severity. Tests may include:  Blood pressure recordings from your arms and legs and measurements of the strength of your pulses (pulse volume recordings).  Imaging studies using sound waves to take pictures of   the blood flow through your blood vessels (Doppler ultrasound).  Injecting a dye into your blood vessels before having imaging studies using:  X-rays (angiogram or arteriogram).  Computer-generated X-rays (CT angiogram).  A powerful electromagnetic field and a computer (magnetic resonance angiogram or MRA). TREATMENT Treatment for PVD depends on the cause of your condition and the severity of your symptoms. It also depends on your age. Underlying  causes need to be treated and controlled. These include long-lasting (chronic) conditions, such as diabetes, high cholesterol, and high blood pressure. You may need to first try making lifestyle changes and taking medicines. Surgery may be needed if these do not work. Lifestyle changes may include:  Quitting smoking.  Exercising regularly.  Following a low-fat, low-cholesterol diet. Medicines may include:  Blood thinners to prevent blood clots.  Medicines to improve blood flow.  Medicines to improve your blood cholesterol levels. Surgical procedures may include:  A procedure that uses an inflated balloon to open a blocked artery and improve blood flow (angioplasty).  A procedure to put in a tube (stent) to keep a blocked artery open (stent implant).  Surgery to reroute blood flow around a blocked artery (peripheral bypass surgery).  Surgery to remove dead tissue from an infected wound on the affected limb.  Amputation. This is surgical removal of the affected limb. This may be necessary in cases of acute ischemic limb that are not improved through medical or surgical treatments. HOME CARE INSTRUCTIONS  Take medicines only as directed by your health care provider.  Do not use any tobacco products, including cigarettes, chewing tobacco, or electronic cigarettes. If you need help quitting, ask your health care provider.  Lose weight if you are overweight, and maintain a healthy weight as directed by your health care provider.  Eat a diet that is low in fat and cholesterol. If you need help, ask your health care provider.  Exercise regularly. Ask your health care provider to suggest some good activities for you.  Use compression stockings or other mechanical devices as directed by your health care provider.  Take good care of your feet.  Wear comfortable shoes that fit well.  Check your feet often for any cuts or sores. SEEK MEDICAL CARE IF:  You have cramps in your legs  while walking.  You have leg pain when you are at rest.  You have coldness in a leg or foot.  Your skin changes.  You have erectile dysfunction.  You have cuts or sores on your feet that are not healing. SEEK IMMEDIATE MEDICAL CARE IF:  Your arm or leg turns cold and blue.  Your arms or legs become red, warm, swollen, painful, or numb.  You have chest pain or trouble breathing.  You suddenly have weakness in your face, arm, or leg.  You become very confused or lose the ability to speak.  You suddenly have a very bad headache or lose your vision.   This information is not intended to replace advice given to you by your health care provider. Make sure you discuss any questions you have with your health care provider.   Document Released: 04/29/2004 Document Revised: 04/12/2014 Document Reviewed: 08/30/2013 Elsevier Interactive Patient Education 2016 [employer]    Before your next abdominal ultrasound:  Take two Extra-Strength Gas-X capsules at bedtime the night before the test. Take another two Extra-Strength Gas-X capsules 3 hours before the test.   

## 2015-12-02 NOTE — Progress Notes (Signed)
[facility] HISTORY AND PHYSICAL   MRN : [id number]  History of Present Illness:   [name] Juarez is a 70 y.o. male patient of Dr. Hart RochesterLawson who returns today for followup regarding his abdominal aortic aneurysm stent graft repair on 11/03/11. Patient has had laser ablation of the right great saphenous vein performed on 2 occasions most recently in January 2015. 1 week later the ultrasound confirmed complete closure of the right great saphenous vein.  He had a CT scan performed in May 2015 which revealed a stent graft of the in good position. He has done well since the ruptured appendix surgery.  Pt denies back or abdominal pain. Pt denies any family history of aneurysms.  Pt reports that he consumes less than 14 ETOH drinks/week.  Pt denies any history of stroke or TIA.   He walks 3-5 miles daily with minimal claudication in calves.   Pt Diabetic: No Pt smoker: former smoker, quit in 2013  Pt meds include: Statin :No Betablocker: No ASA: Yes Other anticoagulants/antiplatelets: no  Current Outpatient Prescriptions  Medication Sig Dispense Refill  . acetaminophen (TYLENOL) 325 MG tablet Take 2 tablets (650 mg total) by mouth every 4 (four) hours as needed for mild pain, fever or headache (Do not take more than 4000 mg of Tylenol (acetaminophen) daily.  This is in your prescription pain medicine.).    Marland Kitchen. aspirin EC 81 MG tablet Take 81 mg by mouth daily.    Marland Kitchen. losartan (COZAAR) 25 MG tablet Take 25 mg by mouth daily.     No current facility-administered medications for this visit.     Past Medical History:  Diagnosis Date  . Arthritis   . Colon polyp 10/2006  . Erectile dysfunction   . Hyperlipidemia   . Hypertension   . Tuberculosis    age 89  . Varicose veins     Social History Social History  Substance Use Topics  . Smoking status: Former Smoker    Years: 45.00    Types: Cigarettes    Quit date: 11/05/2011  . Smokeless tobacco: Never Used  .  Alcohol use 15.0 oz/week    4 Glasses of wine, 21 Cans of beer per week    Family History Family History  Problem Relation Age of Onset  . Tuberculosis Father   . Tuberculosis Sister     Surgical History Past Surgical History:  Procedure Laterality Date  . ABDOMINAL AORTIC ENDOVASCULAR STENT GRAFT  11/03/2011  . APPENDECTOMY    . INGUINAL HERNIA REPAIR Right   . KNEE SURGERY Right   . LAPAROSCOPIC APPENDECTOMY N/A 08/03/2013   Procedure: APPENDECTOMY LAPAROSCOPIC;  Surgeon: Velora Hecklerodd M Gerkin, MD;  Location: [facility];  Service: General;  Laterality: N/A;    No Known Allergies  Current Outpatient Prescriptions  Medication Sig Dispense Refill  . acetaminophen (TYLENOL) 325 MG tablet Take 2 tablets (650 mg total) by mouth every 4 (four) hours as needed for mild pain, fever or headache (Do not take more than 4000 mg of Tylenol (acetaminophen) daily.  This is in your prescription pain medicine.).    Marland Kitchen. aspirin EC 81 MG tablet Take 81 mg by mouth daily.    Marland Kitchen. losartan (COZAAR) 25 MG tablet Take 25 mg by mouth daily.     No current facility-administered medications for this visit.      REVIEW OF SYSTEMS: See HPI for pertinent positives and negatives.  Physical Examination Vitals:   12/02/15 0941  BP: 132/75  Pulse:  62  Resp: 18  Temp: 97.4 F (36.3 C)  SpO2: 97%  Weight: 168 lb 8 oz (76.4 kg)  Height: 5\' 9"  (1.753 m)   Body mass index is 24.88 kg/m.  General: A&O x 3, WD.  Pulmonary: Sym exp, respirations are non labored, good air movt, CTAB, no rales, rhonchi, or wheezing.   Cardiac: RRR, Nl S1, S2, no murmur appreciated  Vascular: Vessel Right Left  Radial 2+Palpable 2+Palpable  Carotid audible without bruit audible without bruit  Aorta Not palpable N/A  Femoral 1+Palpable 1+ Palpable  Popliteal Not palpable Not palpable  PT Not Palpable Not Palpable    DP Not Palpable Not Palpable   Gastrointestinal: soft, NTND, -G/R, - HSM, - palpable masses, - CVAT  B.  Musculoskeletal: M/S 5/5 throughout, extremities without ischemic changes.  Neurologic: Pain and light touch intact in extremities, Motor exam as listed above   CTA Abd/Pelvis Duplex (Date: 12/18/13)  AAA sac size: 4.6 cm (review of records)  no endoleak detected    ASSESSMENT:  [name] Juarez is a 70 y.o. male  who presents s/p EVAR (Date: 11/03/11).  Pt is asymptomatic with decrease in sac size compared to CT in September 2015.  DATA EVAR duplex was technically difficult and limited due to overlying bowel gas.  Patent stent with a maximum diameter of 3.65 x 4.2 cm, compared to CT diameter on 12/18/13 at 4.6 cm  Pedal pulses are not palpable, no non healing wounds. He walks 3-5 miles daily with minimal claudication in calves.  ABI's on 12/31/14 showed all monophasic waveforms which do not correlate with the right ABI of 85%, does correlate with left ABI of 77%. He does not have DM but did smoke until 2013.    PLAN:   Based on today's exam and non-invasive vascular lab results, the patient will follow up in 1 year with the following tests: EVAR duplex and ABI's.  I discussed in depth with the patient the nature of atherosclerosis, and emphasized the importance of maximal medical management including strict control of blood pressure, blood glucose, and lipid levels, obtaining regular exercise, and cessation of smoking.  The patient is aware that without maximal medical management the underlying atherosclerotic disease process will progress, limiting the benefit of any interventions.  The patient was given information about stroke prevention and what symptoms should prompt the patient to seek immediate medical care.  The patient was given information about PAD including signs, symptoms, treatment, what symptoms should prompt the patient to seek immediate medical care, and risk reduction measures to take. Thank you for allowing [place] to participate in this patient's care.  Charisse March, RN, MSN, FNP-C [facility] Office: [phone]  Clinic MD: Early 12/02/2015 10:17 AM

## 2016-10-29 ENCOUNTER — Other Ambulatory Visit: Payer: Self-pay | Admitting: Family Medicine

## 2016-10-29 ENCOUNTER — Other Ambulatory Visit (HOSPITAL_COMMUNITY): Payer: Self-pay | Admitting: Family Medicine

## 2016-10-29 ENCOUNTER — Ambulatory Visit (HOSPITAL_COMMUNITY)
Admission: RE | Admit: 2016-10-29 | Discharge: 2016-10-29 | Disposition: A | Discharge status: Home / Self Care | Payer: Medicare Other | Source: Ambulatory Visit | Attending: Family Medicine | Admitting: Family Medicine

## 2016-10-29 DIAGNOSIS — S161XXA Strain of muscle, fascia and tendon at neck level, initial encounter: Secondary | ICD-10-CM

## 2016-10-29 DIAGNOSIS — S20212A Contusion of left front wall of thorax, initial encounter: Secondary | ICD-10-CM | POA: Diagnosis present

## 2016-10-29 DIAGNOSIS — J984 Other disorders of lung: Secondary | ICD-10-CM | POA: Insufficient documentation

## 2016-10-29 DIAGNOSIS — M47892 Other spondylosis, cervical region: Secondary | ICD-10-CM | POA: Diagnosis not present

## 2016-10-29 DIAGNOSIS — I6522 Occlusion and stenosis of left carotid artery: Secondary | ICD-10-CM | POA: Insufficient documentation

## 2016-10-29 DIAGNOSIS — J449 Chronic obstructive pulmonary disease, unspecified: Secondary | ICD-10-CM | POA: Insufficient documentation

## 2016-11-19 ENCOUNTER — Encounter: Payer: Self-pay | Admitting: Family

## 2016-12-01 ENCOUNTER — Ambulatory Visit (INDEPENDENT_AMBULATORY_CARE_PROVIDER_SITE_OTHER): Payer: Medicare Other | Admitting: Family

## 2016-12-01 ENCOUNTER — Ambulatory Visit (HOSPITAL_COMMUNITY)
Admission: RE | Admit: 2016-12-01 | Discharge: 2016-12-01 | Disposition: A | Discharge status: Home / Self Care | Payer: Medicare Other | Source: Ambulatory Visit | Attending: Family | Admitting: Family

## 2016-12-01 ENCOUNTER — Encounter: Payer: Self-pay | Admitting: Family

## 2016-12-01 ENCOUNTER — Ambulatory Visit (INDEPENDENT_AMBULATORY_CARE_PROVIDER_SITE_OTHER)
Admission: RE | Admit: 2016-12-01 | Discharge: 2016-12-01 | Disposition: A | Discharge status: Home / Self Care | Payer: Medicare Other | Source: Ambulatory Visit | Attending: Family | Admitting: Family

## 2016-12-01 VITALS — BP 124/73 | HR 62 | Resp 16 | Ht 69.0 in | Wt 172.8 lb

## 2016-12-01 DIAGNOSIS — R938 Abnormal findings on diagnostic imaging of other specified body structures: Secondary | ICD-10-CM | POA: Diagnosis not present

## 2016-12-01 DIAGNOSIS — I6522 Occlusion and stenosis of left carotid artery: Secondary | ICD-10-CM

## 2016-12-01 DIAGNOSIS — I714 Abdominal aortic aneurysm, without rupture, unspecified: Secondary | ICD-10-CM

## 2016-12-01 DIAGNOSIS — I10 Essential (primary) hypertension: Secondary | ICD-10-CM | POA: Diagnosis not present

## 2016-12-01 DIAGNOSIS — Z87891 Personal history of nicotine dependence: Secondary | ICD-10-CM | POA: Insufficient documentation

## 2016-12-01 DIAGNOSIS — R0989 Other specified symptoms and signs involving the circulatory and respiratory systems: Secondary | ICD-10-CM | POA: Diagnosis present

## 2016-12-01 DIAGNOSIS — I779 Disorder of arteries and arterioles, unspecified: Secondary | ICD-10-CM | POA: Diagnosis not present

## 2016-12-01 DIAGNOSIS — Z95828 Presence of other vascular implants and grafts: Secondary | ICD-10-CM

## 2016-12-01 NOTE — Patient Instructions (Addendum)
Before your next abdominal ultrasound:  Take two Extra-Strength Gas-X capsules at bedtime the night before the test. Take another two Extra-Strength Gas-X capsules 3 hours before the test.  Avoid gas forming foods the day before the test.       Peripheral Vascular Disease Peripheral vascular disease (PVD) is a disease of the blood vessels that are not part of your heart and brain. A simple term for PVD is poor circulation. In most cases, PVD narrows the blood vessels that carry blood from your heart to the rest of your body. This can result in a decreased supply of blood to your arms, legs, and internal organs, like your stomach or kidneys. However, it most often affects a person's lower legs and feet. There are two types of PVD.  Organic PVD. This is the more common type. It is caused by damage to the structure of blood vessels.  Functional PVD. This is caused by conditions that make blood vessels contract and tighten (spasm).  Without treatment, PVD tends to get worse over time. PVD can also lead to acute ischemic limb. This is when an arm or limb suddenly has trouble getting enough blood. This is a medical emergency. Follow these instructions at home:  Take medicines only as told by your doctor.  Do not use any tobacco products, including cigarettes, chewing tobacco, or electronic cigarettes. If you need help quitting, ask your doctor.  Lose weight if you are overweight, and maintain a healthy weight as told by your doctor.  Eat a diet that is low in fat and cholesterol. If you need help, ask your doctor.  Exercise regularly. Ask your doctor for some good activities for you.  Take good care of your feet. ? Wear comfortable shoes that fit well. ? Check your feet often for any cuts or sores. Contact a doctor if:  You have cramps in your legs while walking.  You have leg pain when you are at rest.  You have coldness in a leg or foot.  Your skin changes.  You are unable to  get or have an erection (erectile dysfunction).  You have cuts or sores on your feet that are not healing. Get help right away if:  Your arm or leg turns cold and blue.  Your arms or legs become red, warm, swollen, painful, or numb.  You have chest pain or trouble breathing.  You suddenly have weakness in your face, arm, or leg.  You become very confused or you cannot speak.  You suddenly have a very bad headache.  You suddenly cannot see. This information is not intended to replace advice given to you by your health care provider. Make sure you discuss any questions you have with your health care provider. Document Released: 06/16/2009 Document Revised: 08/28/2015 Document Reviewed: 08/30/2013 Elsevier Interactive Patient Education  2017 [employer].  
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
## 2016-12-08 NOTE — Addendum Note (Signed)
Addended by: Burton ApleyPETTY, Lillyahna Hemberger A on: 12/08/2016 03:29 PM   Modules accepted: Orders

## 2018-03-20 ENCOUNTER — Other Ambulatory Visit: Payer: Self-pay

## 2018-03-20 DIAGNOSIS — I714 Abdominal aortic aneurysm, without rupture, unspecified: Secondary | ICD-10-CM

## 2018-03-20 DIAGNOSIS — I6522 Occlusion and stenosis of left carotid artery: Secondary | ICD-10-CM

## 2018-03-20 DIAGNOSIS — I779 Disorder of arteries and arterioles, unspecified: Secondary | ICD-10-CM

## 2018-03-20 DIAGNOSIS — Z87891 Personal history of nicotine dependence: Secondary | ICD-10-CM

## 2018-03-20 DIAGNOSIS — Z95828 Presence of other vascular implants and grafts: Secondary | ICD-10-CM

## 2018-05-03 ENCOUNTER — Ambulatory Visit (HOSPITAL_COMMUNITY)
Admission: RE | Admit: 2018-05-03 | Discharge: 2018-05-03 | Disposition: A | Discharge status: Home / Self Care | Payer: Medicare Other | Source: Ambulatory Visit | Attending: Family | Admitting: Family

## 2018-05-03 DIAGNOSIS — Z95828 Presence of other vascular implants and grafts: Secondary | ICD-10-CM

## 2018-05-03 DIAGNOSIS — I714 Abdominal aortic aneurysm, without rupture, unspecified: Secondary | ICD-10-CM

## 2018-05-03 DIAGNOSIS — Z87891 Personal history of nicotine dependence: Secondary | ICD-10-CM | POA: Diagnosis present

## 2018-05-05 ENCOUNTER — Encounter (HOSPITAL_COMMUNITY): Payer: Medicare Other

## 2018-05-05 ENCOUNTER — Ambulatory Visit: Payer: Medicare Other | Admitting: Family
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
## 2018-05-18 ENCOUNTER — Ambulatory Visit (HOSPITAL_COMMUNITY)
Admission: RE | Admit: 2018-05-18 | Discharge: 2018-05-18 | Disposition: A | Discharge status: Home / Self Care | Payer: Medicare Other | Source: Ambulatory Visit | Attending: Family | Admitting: Family

## 2018-05-18 ENCOUNTER — Ambulatory Visit (INDEPENDENT_AMBULATORY_CARE_PROVIDER_SITE_OTHER)
Admission: RE | Admit: 2018-05-18 | Discharge: 2018-05-18 | Disposition: A | Discharge status: Home / Self Care | Payer: Medicare Other | Source: Ambulatory Visit | Attending: Family | Admitting: Family

## 2018-05-18 ENCOUNTER — Other Ambulatory Visit: Payer: Self-pay

## 2018-05-18 ENCOUNTER — Ambulatory Visit: Payer: Medicare Other | Admitting: Physician Assistant

## 2018-05-18 ENCOUNTER — Encounter: Payer: Self-pay | Admitting: Physician Assistant

## 2018-05-18 VITALS — BP 125/78 | HR 64 | Temp 97.4°F | Resp 20 | Ht 69.0 in | Wt 164.0 lb

## 2018-05-18 DIAGNOSIS — Z87891 Personal history of nicotine dependence: Secondary | ICD-10-CM

## 2018-05-18 DIAGNOSIS — I779 Disorder of arteries and arterioles, unspecified: Secondary | ICD-10-CM

## 2018-05-18 DIAGNOSIS — I6522 Occlusion and stenosis of left carotid artery: Secondary | ICD-10-CM

## 2018-05-18 DIAGNOSIS — I714 Abdominal aortic aneurysm, without rupture, unspecified: Secondary | ICD-10-CM

## 2018-12-28 ENCOUNTER — Other Ambulatory Visit: Payer: Self-pay | Admitting: Family Medicine

## 2018-12-28 DIAGNOSIS — R413 Other amnesia: Secondary | ICD-10-CM

## 2018-12-28 DIAGNOSIS — S0542XA Penetrating wound of orbit with or without foreign body, left eye, initial encounter: Secondary | ICD-10-CM

## 2018-12-28 DIAGNOSIS — S0541XA Penetrating wound of orbit with or without foreign body, right eye, initial encounter: Secondary | ICD-10-CM

## 2019-01-16 ENCOUNTER — Ambulatory Visit
Admission: RE | Admit: 2019-01-16 | Discharge: 2019-01-16 | Disposition: A | Discharge status: Home / Self Care | Payer: Medicare Other | Source: Ambulatory Visit | Attending: Family Medicine | Admitting: Family Medicine

## 2019-01-16 ENCOUNTER — Other Ambulatory Visit: Payer: Self-pay | Admitting: Family Medicine

## 2019-01-16 ENCOUNTER — Other Ambulatory Visit: Payer: Self-pay

## 2019-01-16 DIAGNOSIS — R413 Other amnesia: Secondary | ICD-10-CM

## 2019-01-16 DIAGNOSIS — S0541XA Penetrating wound of orbit with or without foreign body, right eye, initial encounter: Secondary | ICD-10-CM
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
## 2019-04-06 HISTORY — PX: COLONOSCOPY: SHX174

## 2019-05-05 ENCOUNTER — Ambulatory Visit: Payer: Medicare Other

## 2019-05-10 ENCOUNTER — Ambulatory Visit: Payer: Medicare Other

## 2019-07-18 ENCOUNTER — Other Ambulatory Visit: Payer: Self-pay | Admitting: *Deleted

## 2019-07-18 DIAGNOSIS — I779 Disorder of arteries and arterioles, unspecified: Secondary | ICD-10-CM

## 2019-07-18 DIAGNOSIS — Z95828 Presence of other vascular implants and grafts: Secondary | ICD-10-CM

## 2019-07-26 ENCOUNTER — Ambulatory Visit (INDEPENDENT_AMBULATORY_CARE_PROVIDER_SITE_OTHER)
Admission: RE | Admit: 2019-07-26 | Discharge: 2019-07-26 | Disposition: A | Discharge status: Home / Self Care | Payer: Medicare Other | Source: Ambulatory Visit | Attending: Surgery | Admitting: Surgery

## 2019-07-26 ENCOUNTER — Ambulatory Visit (HOSPITAL_COMMUNITY)
Admission: RE | Admit: 2019-07-26 | Discharge: 2019-07-26 | Disposition: A | Discharge status: Home / Self Care | Payer: Medicare Other | Source: Ambulatory Visit | Attending: Surgery | Admitting: Surgery

## 2019-07-26 ENCOUNTER — Ambulatory Visit: Payer: Medicare Other | Admitting: Physician Assistant

## 2019-07-26 ENCOUNTER — Other Ambulatory Visit: Payer: Self-pay

## 2019-07-26 VITALS — BP 148/84 | HR 53 | Temp 97.2°F | Resp 16 | Ht 69.0 in | Wt 160.2 lb

## 2019-07-26 DIAGNOSIS — I779 Disorder of arteries and arterioles, unspecified: Secondary | ICD-10-CM | POA: Diagnosis not present

## 2019-07-26 DIAGNOSIS — Z87891 Personal history of nicotine dependence: Secondary | ICD-10-CM

## 2019-07-26 DIAGNOSIS — Z95828 Presence of other vascular implants and grafts: Secondary | ICD-10-CM | POA: Diagnosis present

## 2019-07-26 DIAGNOSIS — I714 Abdominal aortic aneurysm, without rupture, unspecified: Secondary | ICD-10-CM

## 2019-07-26 NOTE — Progress Notes (Signed)
HISTORY AND PHYSICAL     CC:  follow up. Requesting Provider:  Johny Blamer, MD  HPI: This is a 74 y.o. male who is here today for follow up.  He underwent EVAR on 11/03/11 by Dr. Hart Rochester.  He also had a laser ablation of the right GSV on two occasions with the most recent in 2015.  U/s a week later confirmed complete closure of the right GSV.     He was last seen on 05/18/2018 and at that time, he was doing well and walking without issues.  He did not have any swelling and was not wearing compression.    The pt returns today for follow up.  He states he is doing very well.  He denies any abdominal or back pain.  He denies any pain while walking or any non healing wounds.  He is not taking a statin as he states his cholesterol is good and he doesn't need it.   He states that his legs are doing well since is vein procedure and does not have any leg swelling.  He denies any stroke sx specifically, amaurosis fugax, hemiparalysis or speech difficulties.   Pt and his wife are from [place] and have lived here for 45 years.  Was not able to get back to [place] last year due to covid.  He now has had his vaccine.   The pt is not on a statin for cholesterol management.    The pt is on an aspirin.    Other AC:  none The pt is on ARB for hypertension.  The pt does not have diabetes. Tobacco hx:  Former-quit 2013   Past Medical History:  Diagnosis Date  . Arthritis   . Colon polyp 10/2006  . Erectile dysfunction   . Hyperlipidemia   . Hypertension   . Tuberculosis    age 19  . Varicose veins     Past Surgical History:  Procedure Laterality Date  . ABDOMINAL AORTIC ENDOVASCULAR STENT GRAFT  11/03/2011  . APPENDECTOMY    . INGUINAL HERNIA REPAIR Right   . KNEE SURGERY Right   . LAPAROSCOPIC APPENDECTOMY N/A 08/03/2013   Procedure: APPENDECTOMY LAPAROSCOPIC;  Surgeon: Velora Heckler, MD;  Location: [facility];  Service: General;  Laterality: N/A;    No Known Allergies  Current Outpatient  Medications  Medication Sig Dispense Refill  . acetaminophen (TYLENOL) 325 MG tablet Take 2 tablets (650 mg total) by mouth every 4 (four) hours as needed for mild pain, fever or headache (Do not take more than 4000 mg of Tylenol (acetaminophen) daily.  This is in your prescription pain medicine.).    Marland Kitchen aspirin EC 81 MG tablet Take 81 mg by mouth daily.    Marland Kitchen losartan (COZAAR) 25 MG tablet Take 25 mg by mouth daily.     No current facility-administered medications for this visit.    Family History  Problem Relation Age of Onset  . Tuberculosis Father   . Tuberculosis Sister     Social History   Socioeconomic History  . Marital status: Married    Spouse name: Not on file  . Number of children: Not on file  . Years of education: Not on file  . Highest education level: Not on file  Occupational History  . Not on file  Tobacco Use  . Smoking status: Former Smoker    Years: 45.00    Types: Cigarettes    Quit date: 11/05/2011    Years since quitting: 7.7  .  Smokeless tobacco: Never Used  Substance and Sexual Activity  . Alcohol use: Yes    Alcohol/week: 25.0 standard drinks    Types: 4 Glasses of wine, 21 Cans of beer per week  . Drug use: No  . Sexual activity: Not on file  Other Topics Concern  . Not on file  Social History Narrative   Originally from [place].  In [place] since 1970s   Social Determinants of Health   Financial Resource Strain:   . Difficulty of Paying Living Expenses:   Food Insecurity:   . Worried About Programme researcher, broadcasting/film/video in the Last Year:   . Barista in the Last Year:   Transportation Needs:   . Freight forwarder (Medical):   Marland Kitchen Lack of Transportation (Non-Medical):   Physical Activity:   . Days of Exercise per Week:   . Minutes of Exercise per Session:   Stress:   . Feeling of Stress :   Social Connections:   . Frequency of Communication with Friends and Family:   . Frequency of Social Gatherings with Friends and Family:   .  Attends Religious Services:   . Active Member of Clubs or Organizations:   . Attends Banker Meetings:   Marland Kitchen Marital Status:   Intimate Partner Violence:   . Fear of Current or Ex-Partner:   . Emotionally Abused:   Marland Kitchen Physically Abused:   . Sexually Abused:      REVIEW OF SYSTEMS:   [X]  denotes positive finding, [ ]  denotes negative finding Cardiac  Comments:  Chest pain or chest pressure:    Shortness of breath upon exertion:    Short of breath when lying flat:    Irregular heart rhythm:        Vascular    Pain in calf, thigh, or hip brought on by ambulation:    Pain in feet at night that wakes you up from your sleep:     Blood clot in your veins:    Leg swelling:         Pulmonary    Oxygen at home:    Productive cough:     Wheezing:         Neurologic    Sudden weakness in arms or legs:     Sudden numbness in arms or legs:     Sudden onset of difficulty speaking or slurred speech:    Temporary loss of vision in one eye:     Problems with dizziness:         Gastrointestinal    Blood in stool:     Vomited blood:         Genitourinary    Burning when urinating:     Blood in urine:        Psychiatric    Major depression:         Hematologic    Bleeding problems:    Problems with blood clotting too easily:        Skin    Rashes or ulcers:        Constitutional    Fever or chills:      PHYSICAL EXAMINATION:  Today's Vitals   07/26/19 0957  BP: (!) 148/84  Pulse: (!) 53  Resp: 16  Temp: (!) 97.2 F (36.2 C)  SpO2: 96%  Weight: 160 lb 3.2 oz (72.7 kg)  Height: 5\' 9"  (1.753 m)   Body mass index is 23.66 kg/m.   General:  WDWN  in NAD; vital signs documented above Gait: Not observed HENT: WNL, normocephalic Pulmonary: normal non-labored breathing , without Rales, rhonchi,  wheezing Cardiac: regular HR, without  Murmurs; without carotid bruits Abdomen: soft, NT, no masses Skin: without rashes Vascular Exam/Pulses:  Right Left   Radial 2+ (normal) 2+ (normal)  Ulnar Unable to palpate  Unable to palpate   Femoral 2+ (normal) 2+ (normal)  Popliteal Unable to palpate  Unable to palpate   DP Unable to palpate  Unable to palpate   PT Unable to palpate  Unable to palpate    Extremities: without ischemic changes, without Gangrene , without cellulitis; without open wounds;  Musculoskeletal: no muscle wasting or atrophy  Neurologic: A&O X 3;  No focal weakness or paresthesias are detected Psychiatric:  The pt has Normal affect.   Non-Invasive Vascular Imaging:   ABI's/TBI's on 07/26/2019: Right:  1.06/0.64 PT (T) DP (B) - Great toe pressure: 102 Left:  0.86/0.56 PT (B) DP (T)- Great toe pressure:  90   EVAR duplex on 07/26/2019: Abdominal Aorta Findings:  +--------+-------+----------+----------+--------+--------+--------+  LocationAP (cm)Trans (cm)PSV (cm/s)WaveformThrombusComments  +--------+-------+----------+----------+--------+--------+--------+  Proximal2.36  2.38   65                  +--------+-------+----------+----------+--------+--------+--------+    Endovascular Aortic Repair (EVAR):  +----------+----------------+-------------------+-------------------+       Diameter AP (cm)Diameter Trans (cm)Velocities (cm/sec)  +----------+----------------+-------------------+-------------------+  Aorta   4.00      3.93        43           +----------+----------------+-------------------+-------------------+  Right Limb1.36      1.26        48           +----------+----------------+-------------------+-------------------+  Left Limb 1.83      1.84        34           +----------+----------------+-------------------+-------------------+   Summary:  Abdominal Aorta: Patent endovascular aneurysm repair with no evidence of endoleak.   Previous ABI's/TBI's on 05/18/2018: Right:   1.15/0.74 (B) Left:  0.91/0.70 (B)   Previous EVAR duplex on 05/03/2018: Abdominal Aorta: Patent endovascular aneurysm repair with no evidence of endoleak. The largest aortic diameter remains essentially unchanged compared to prior exam. Previous diameter measurement was 4.0 cm obtained on 12/01/2016.  Carotid duplex 05/18/2018: 1-39% ICA stenosis bilaterally   ASSESSMENT/PLAN:: 74 y.o. male here for follow up for EVAR  on 11/03/11 by Dr. Kellie Simmering  EVAR:  Pt continues to do well and exam unchanged from previous and u/s reveals no endoleak.  Diameter essentially unchanged from previous exam.  Return in one year with repeat duplex.  Pt is not on a statin-will defer to PCP to start  Continue asa daily.  ABI's:  ABI's today are 1.06 on the right and 0.86 on the left, which are also essentially unchanged.  I cannot palpate his pedal pulses but waveforms on duplex are biphasic and triphasic.  He does not have any claudication or non healing wounds.  Will check ABI's in one year when he returns.  He will call us sooner if he develops non healing wounds or claudication.   Carotids:  Pt had duplex last year with 1-39% bilateral ICA stenosis.  Pt remains asymptomatic.  Will not repeat unless he becomes asymptomatic or has issues.     Leontine Locket, [facility] [phone]  Clinic MD:   Oneida Alar

## 2019-07-31 ENCOUNTER — Other Ambulatory Visit: Payer: Self-pay | Admitting: *Deleted

## 2019-07-31 DIAGNOSIS — Z95828 Presence of other vascular implants and grafts: Secondary | ICD-10-CM

## 2019-07-31 DIAGNOSIS — I779 Disorder of arteries and arterioles, unspecified: Secondary | ICD-10-CM

## 2019-10-24 ENCOUNTER — Other Ambulatory Visit: Payer: Self-pay | Admitting: Gastroenterology

## 2019-10-24 DIAGNOSIS — R109 Unspecified abdominal pain: Secondary | ICD-10-CM

## 2019-10-24 DIAGNOSIS — R634 Abnormal weight loss: Secondary | ICD-10-CM

## 2019-10-31 ENCOUNTER — Ambulatory Visit
Admission: RE | Admit: 2019-10-31 | Discharge: 2019-10-31 | Disposition: A | Discharge status: Home / Self Care | Payer: Medicare Other | Source: Ambulatory Visit | Attending: Gastroenterology | Admitting: Gastroenterology

## 2019-10-31 DIAGNOSIS — R634 Abnormal weight loss: Secondary | ICD-10-CM

## 2019-10-31 DIAGNOSIS — R109 Unspecified abdominal pain: Secondary | ICD-10-CM

## 2019-11-14 ENCOUNTER — Encounter (HOSPITAL_BASED_OUTPATIENT_CLINIC_OR_DEPARTMENT_OTHER): Payer: Self-pay

## 2019-11-14 ENCOUNTER — Emergency Department (HOSPITAL_BASED_OUTPATIENT_CLINIC_OR_DEPARTMENT_OTHER): Payer: Medicare Other

## 2019-11-14 ENCOUNTER — Other Ambulatory Visit: Payer: Self-pay

## 2019-11-14 ENCOUNTER — Inpatient Hospital Stay (HOSPITAL_BASED_OUTPATIENT_CLINIC_OR_DEPARTMENT_OTHER)
Admission: EM | Admit: 2019-11-14 | Discharge: 2019-11-17 | DRG: 699 | Disposition: A | Discharge status: Home / Self Care | Payer: Medicare Other | Attending: Internal Medicine | Admitting: Internal Medicine

## 2019-11-14 DIAGNOSIS — R55 Syncope and collapse: Secondary | ICD-10-CM

## 2019-11-14 DIAGNOSIS — N1832 Chronic kidney disease, stage 3b: Secondary | ICD-10-CM | POA: Diagnosis present

## 2019-11-14 DIAGNOSIS — N321 Vesicointestinal fistula: Secondary | ICD-10-CM | POA: Diagnosis present

## 2019-11-14 DIAGNOSIS — J449 Chronic obstructive pulmonary disease, unspecified: Secondary | ICD-10-CM | POA: Diagnosis present

## 2019-11-14 DIAGNOSIS — I8390 Asymptomatic varicose veins of unspecified lower extremity: Secondary | ICD-10-CM | POA: Diagnosis present

## 2019-11-14 DIAGNOSIS — Z831 Family history of other infectious and parasitic diseases: Secondary | ICD-10-CM | POA: Diagnosis not present

## 2019-11-14 DIAGNOSIS — I77811 Abdominal aortic ectasia: Secondary | ICD-10-CM | POA: Diagnosis present

## 2019-11-14 DIAGNOSIS — Z87891 Personal history of nicotine dependence: Secondary | ICD-10-CM | POA: Diagnosis not present

## 2019-11-14 DIAGNOSIS — I129 Hypertensive chronic kidney disease with stage 1 through stage 4 chronic kidney disease, or unspecified chronic kidney disease: Secondary | ICD-10-CM | POA: Diagnosis present

## 2019-11-14 DIAGNOSIS — Z8679 Personal history of other diseases of the circulatory system: Secondary | ICD-10-CM

## 2019-11-14 DIAGNOSIS — F0390 Unspecified dementia without behavioral disturbance: Secondary | ICD-10-CM | POA: Diagnosis present

## 2019-11-14 DIAGNOSIS — E872 Acidosis: Secondary | ICD-10-CM | POA: Diagnosis present

## 2019-11-14 DIAGNOSIS — I714 Abdominal aortic aneurysm, without rupture: Secondary | ICD-10-CM | POA: Diagnosis present

## 2019-11-14 DIAGNOSIS — Z79899 Other long term (current) drug therapy: Secondary | ICD-10-CM

## 2019-11-14 DIAGNOSIS — Z7982 Long term (current) use of aspirin: Secondary | ICD-10-CM

## 2019-11-14 DIAGNOSIS — S0003XA Contusion of scalp, initial encounter: Secondary | ICD-10-CM | POA: Diagnosis present

## 2019-11-14 DIAGNOSIS — N529 Male erectile dysfunction, unspecified: Secondary | ICD-10-CM | POA: Diagnosis present

## 2019-11-14 DIAGNOSIS — E861 Hypovolemia: Secondary | ICD-10-CM | POA: Diagnosis present

## 2019-11-14 DIAGNOSIS — K5900 Constipation, unspecified: Secondary | ICD-10-CM | POA: Diagnosis present

## 2019-11-14 DIAGNOSIS — M1990 Unspecified osteoarthritis, unspecified site: Secondary | ICD-10-CM | POA: Diagnosis present

## 2019-11-14 DIAGNOSIS — Z8673 Personal history of transient ischemic attack (TIA), and cerebral infarction without residual deficits: Secondary | ICD-10-CM | POA: Diagnosis not present

## 2019-11-14 DIAGNOSIS — N3000 Acute cystitis without hematuria: Secondary | ICD-10-CM | POA: Diagnosis present

## 2019-11-14 DIAGNOSIS — E785 Hyperlipidemia, unspecified: Secondary | ICD-10-CM | POA: Diagnosis present

## 2019-11-14 DIAGNOSIS — K5730 Diverticulosis of large intestine without perforation or abscess without bleeding: Secondary | ICD-10-CM | POA: Diagnosis present

## 2019-11-14 DIAGNOSIS — Z20822 Contact with and (suspected) exposure to covid-19: Secondary | ICD-10-CM | POA: Diagnosis present

## 2019-11-14 DIAGNOSIS — Z8719 Personal history of other diseases of the digestive system: Secondary | ICD-10-CM

## 2019-11-14 HISTORY — DX: Vesicointestinal fistula: N32.1

## 2019-11-14 LAB — COMPREHENSIVE METABOLIC PANEL
ALT: 26 U/L (ref 0–44)
AST: 23 U/L (ref 15–41)
Albumin: 4.4 g/dL (ref 3.5–5.0)
Alkaline Phosphatase: 60 U/L (ref 38–126)
Anion gap: 10 (ref 5–15)
BUN: 29 mg/dL — ABNORMAL HIGH (ref 8–23)
CO2: 21 mmol/L — ABNORMAL LOW (ref 22–32)
Calcium: 9 mg/dL (ref 8.9–10.3)
Chloride: 100 mmol/L (ref 98–111)
Creatinine, Ser: 1.41 mg/dL — ABNORMAL HIGH (ref 0.61–1.24)
GFR calc Af Amer: 56 mL/min — ABNORMAL LOW (ref >60)
GFR calc non Af Amer: 48 mL/min — ABNORMAL LOW (ref >60)
Glucose, Bld: 132 mg/dL — ABNORMAL HIGH (ref 70–99)
Potassium: 4.3 mmol/L (ref 3.5–5.1)
Sodium: 131 mmol/L — ABNORMAL LOW (ref 135–145)
Total Bilirubin: 0.5 mg/dL (ref 0.3–1.2)
Total Protein: 7.7 g/dL (ref 6.5–8.1)

## 2019-11-14 LAB — URINALYSIS, MICROSCOPIC (REFLEX)

## 2019-11-14 LAB — LACTIC ACID, PLASMA
Lactic Acid, Venous: 1.6 mmol/L (ref 0.5–1.9)
Lactic Acid, Venous: 2 mmol/L (ref 0.5–1.9)
Lactic Acid, Venous: 0.9 mmol/L (ref 0.5–1.9)

## 2019-11-14 LAB — CBC WITH DIFFERENTIAL/PLATELET
Abs Immature Granulocytes: 0.11 10*3/uL — ABNORMAL HIGH (ref 0.00–0.07)
Basophils Absolute: 0.1 10*3/uL (ref 0.0–0.1)
Basophils Relative: 0 %
Eosinophils Absolute: 0 10*3/uL (ref 0.0–0.5)
Eosinophils Relative: 0 %
HCT: 39.5 % (ref 39.0–52.0)
Hemoglobin: 12.5 g/dL — ABNORMAL LOW (ref 13.0–17.0)
Immature Granulocytes: 1 %
Lymphocytes Relative: 4 %
Lymphs Abs: 0.9 10*3/uL (ref 0.7–4.0)
MCH: 29.9 pg (ref 26.0–34.0)
MCHC: 31.6 g/dL (ref 30.0–36.0)
MCV: 94.5 fL (ref 80.0–100.0)
Monocytes Absolute: 1.2 10*3/uL — ABNORMAL HIGH (ref 0.1–1.0)
Monocytes Relative: 6 %
Neutro Abs: 18.9 10*3/uL — ABNORMAL HIGH (ref 1.7–7.7)
Neutrophils Relative %: 89 %
Platelets: 340 10*3/uL (ref 150–400)
RBC: 4.18 MIL/uL — ABNORMAL LOW (ref 4.22–5.81)
RDW: 13.3 % (ref 11.5–15.5)
WBC: 21.2 10*3/uL — ABNORMAL HIGH (ref 4.0–10.5)
nRBC: 0 % (ref 0.0–0.2)

## 2019-11-14 LAB — LIPASE, BLOOD: Lipase: 255 U/L — ABNORMAL HIGH (ref 11–51)

## 2019-11-14 LAB — URINALYSIS, ROUTINE W REFLEX MICROSCOPIC
Bilirubin Urine: NEGATIVE
Glucose, UA: NEGATIVE mg/dL
Ketones, ur: NEGATIVE mg/dL
Nitrite: NEGATIVE
Protein, ur: 30 mg/dL — AB
Specific Gravity, Urine: 1.025 (ref 1.005–1.030)
pH: 5.5 (ref 5.0–8.0)

## 2019-11-14 LAB — SARS CORONAVIRUS 2 BY RT PCR (HOSPITAL ORDER, PERFORMED IN ~~LOC~~ HOSPITAL LAB): SARS Coronavirus 2: NEGATIVE

## 2019-11-14 LAB — TSH: TSH: 1.526 u[IU]/mL (ref 0.350–4.500)

## 2019-11-14 LAB — TROPONIN I (HIGH SENSITIVITY)
Troponin I (High Sensitivity): 4 ng/L (ref <18)
Troponin I (High Sensitivity): 4 ng/L (ref <18)

## 2019-11-14 LAB — MAGNESIUM: Magnesium: 1.9 mg/dL (ref 1.7–2.4)

## 2019-11-14 MED ORDER — ACETAMINOPHEN 650 MG RE SUPP: 650.0000 mg | Freq: Four times a day (QID) | RECTAL | Status: DC | PRN

## 2019-11-14 MED ORDER — SODIUM CHLORIDE 0.9 % IV SOLN: INTRAVENOUS | Status: DC

## 2019-11-14 MED ORDER — LORAZEPAM 2 MG/ML IJ SOLN
0.0000 mg | Freq: Four times a day (QID) | INTRAMUSCULAR | Status: AC
  Administered 2019-11-15: 2 mg via INTRAVENOUS
  Filled 2019-11-14: qty 1

## 2019-11-14 MED ORDER — FOLIC ACID 1 MG PO TABS
1.0000 mg | ORAL_TABLET | Freq: Every day | ORAL | Status: DC
  Administered 2019-11-15 – 2019-11-17 (×3): 1 mg via ORAL
  Filled 2019-11-14 (×3): qty 1

## 2019-11-14 MED ORDER — PIPERACILLIN-TAZOBACTAM 3.375 G IVPB 30 MIN
3.3750 g | Freq: Once | INTRAVENOUS | Status: AC
  Administered 2019-11-14: 3.375 g via INTRAVENOUS
  Filled 2019-11-14 (×2): qty 50

## 2019-11-14 MED ORDER — ONDANSETRON HCL 4 MG/2ML IJ SOLN
4.0000 mg | Freq: Four times a day (QID) | INTRAMUSCULAR | Status: DC | PRN
  Administered 2019-11-15: 4 mg via INTRAVENOUS
  Filled 2019-11-14: qty 2

## 2019-11-14 MED ORDER — PIPERACILLIN-TAZOBACTAM 3.375 G IVPB 30 MIN: 3.3750 g | Freq: Three times a day (TID) | INTRAVENOUS | Status: DC

## 2019-11-14 MED ORDER — ACETAMINOPHEN 325 MG PO TABS
650.0000 mg | ORAL_TABLET | Freq: Four times a day (QID) | ORAL | Status: DC | PRN
  Filled 2019-11-14: qty 2

## 2019-11-14 MED ORDER — THIAMINE HCL 100 MG PO TABS
100.0000 mg | ORAL_TABLET | Freq: Every day | ORAL | Status: DC
  Administered 2019-11-15 – 2019-11-17 (×3): 100 mg via ORAL
  Filled 2019-11-14 (×3): qty 1

## 2019-11-14 MED ORDER — LORAZEPAM 1 MG PO TABS
1.0000 mg | ORAL_TABLET | ORAL | Status: DC | PRN
  Filled 2019-11-14: qty 1

## 2019-11-14 MED ORDER — LORAZEPAM 2 MG/ML IJ SOLN: 0.0000 mg | Freq: Two times a day (BID) | INTRAMUSCULAR | Status: DC

## 2019-11-14 MED ORDER — PIPERACILLIN-TAZOBACTAM 3.375 G IVPB
3.3750 g | Freq: Three times a day (TID) | INTRAVENOUS | Status: DC
  Administered 2019-11-15 – 2019-11-17 (×6): 3.375 g via INTRAVENOUS
  Filled 2019-11-14 (×7): qty 50

## 2019-11-14 MED ORDER — THIAMINE HCL 100 MG/ML IJ SOLN: 100.0000 mg | Freq: Every day | INTRAMUSCULAR | Status: DC

## 2019-11-14 MED ORDER — HEPARIN SODIUM (PORCINE) 5000 UNIT/ML IJ SOLN
5000.0000 [IU] | Freq: Three times a day (TID) | INTRAMUSCULAR | Status: DC
  Administered 2019-11-15 – 2019-11-17 (×7): 5000 [IU] via SUBCUTANEOUS
  Filled 2019-11-14 (×7): qty 1

## 2019-11-14 MED ORDER — SODIUM CHLORIDE 0.9 % IV BOLUS
1000.0000 mL | Freq: Once | INTRAVENOUS | Status: AC
  Administered 2019-11-14: 1000 mL via INTRAVENOUS

## 2019-11-14 MED ORDER — IOHEXOL 300 MG/ML  SOLN
100.0000 mL | Freq: Once | INTRAMUSCULAR | Status: AC | PRN
  Administered 2019-11-14: 100 mL via INTRAVENOUS

## 2019-11-14 MED ORDER — ONDANSETRON HCL 4 MG PO TABS: 4.0000 mg | ORAL_TABLET | Freq: Four times a day (QID) | ORAL | Status: DC | PRN

## 2019-11-14 MED ORDER — LORAZEPAM 2 MG/ML IJ SOLN: 1.0000 mg | INTRAMUSCULAR | Status: DC | PRN

## 2019-11-14 MED ORDER — ADULT MULTIVITAMIN W/MINERALS CH
1.0000 | ORAL_TABLET | Freq: Every day | ORAL | Status: DC
  Administered 2019-11-15 – 2019-11-17 (×3): 1 via ORAL
  Filled 2019-11-14 (×3): qty 1

## 2019-11-14 MED ORDER — OXYCODONE HCL 5 MG PO TABS
5.0000 mg | ORAL_TABLET | ORAL | Status: DC | PRN
  Filled 2019-11-14: qty 1

## 2019-11-14 MED ORDER — DONEPEZIL HCL 10 MG PO TABS
10.0000 mg | ORAL_TABLET | Freq: Every day | ORAL | Status: DC
  Administered 2019-11-15 – 2019-11-17 (×3): 10 mg via ORAL
  Filled 2019-11-14 (×3): qty 1

## 2019-11-14 NOTE — ED Notes (Signed)
ED Provider at bedside. 
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
## 2019-11-14 NOTE — ED Provider Notes (Signed)
[facility] EMERGENCY DEPARTMENT Provider Note   CSN: [id number] Arrival date & time: 11/14/19  1437     History Chief Complaint  Patient presents with  . Near Syncope    [name] Juarez is a 74 y.o. male.  The history is provided by the patient and medical records. No language interpreter was used.  Loss of Consciousness Episode history:  Single Most recent episode:  Today Duration:  1 minute Timing:  Rare Progression:  Resolved Chronicity:  New Context: standing up   Witnessed: yes   Relieved by:  Nothing Worsened by:  Posture Ineffective treatments:  None tried Associated symptoms: malaise/fatigue and nausea   Associated symptoms: no anxiety, no chest pain, no confusion, no diaphoresis, no difficulty breathing, no dizziness, no fever, no focal sensory loss, no focal weakness, no headaches, no palpitations, no recent injury, no recent surgery, no rectal bleeding, no seizures, no shortness of breath, no vomiting and no weakness   Risk factors: vascular disease   Risk factors: no congenital heart disease, no coronary artery disease and no seizures        Past Medical History:  Diagnosis Date  . Arthritis   . Colon polyp 10/2006  . Erectile dysfunction   . Hyperlipidemia   . Hypertension   . Tuberculosis    age 56  . Varicose veins     Patient Active Problem List   Diagnosis Date Noted  . Acute renal failure - resolving 08/08/2013  . Hypertension   . Acute appendicitis with peritonitis, perforation s/p lap appy 08/03/2013 08/03/2013  . Abdominal aneurysm without mention of rupture 09/28/2011  . Varicose veins of lower extremities with other complications 04/27/2011    Past Surgical History:  Procedure Laterality Date  . ABDOMINAL AORTIC ENDOVASCULAR STENT GRAFT  11/03/2011  . APPENDECTOMY    . INGUINAL HERNIA REPAIR Right   . KNEE SURGERY Right   . LAPAROSCOPIC APPENDECTOMY N/A 08/03/2013   Procedure: APPENDECTOMY LAPAROSCOPIC;  Surgeon: Velora Heckler,  MD;  Location: [facility];  Service: General;  Laterality: N/A;       Family History  Problem Relation Age of Onset  . Tuberculosis Father   . Tuberculosis Sister     Social History   Tobacco Use  . Smoking status: Former Smoker    Years: 45.00    Types: Cigarettes    Quit date: 11/05/2011    Years since quitting: 8.0  . Smokeless tobacco: Never Used  Vaping Use  . Vaping Use: Never used  Substance Use Topics  . Alcohol use: Yes    Alcohol/week: 25.0 standard drinks    Types: 4 Glasses of wine, 21 Cans of beer per week  . Drug use: No    Home Medications Prior to Admission medications   Medication Sig Start Date End Date Taking? Authorizing Provider  acetaminophen (TYLENOL) 325 MG tablet Take 2 tablets (650 mg total) by mouth every 4 (four) hours as needed for mild pain, fever or headache (Do not take more than 4000 mg of Tylenol (acetaminophen) daily.  This is in your prescription pain medicine.). 08/08/13   Sherrie George, PA-C  aspirin EC 81 MG tablet Take 81 mg by mouth daily.    [provider]  losartan (COZAAR) 25 MG tablet Take 25 mg by mouth daily.    [provider]    Allergies    Patient has no known allergies.  Review of Systems   Review of Systems  Constitutional: Positive for fatigue and malaise/fatigue.  Negative for chills, diaphoresis and fever.  HENT: Positive for congestion.   Eyes: Negative for photophobia and visual disturbance.  Respiratory: Positive for cough. Negative for chest tightness, shortness of breath and wheezing.   Cardiovascular: Positive for syncope. Negative for chest pain, palpitations and leg swelling.  Gastrointestinal: Positive for diarrhea and nausea. Negative for abdominal pain, constipation and vomiting.  Genitourinary: Positive for dysuria and frequency.  Musculoskeletal: Negative for back pain, neck pain and neck stiffness.  Skin: Positive for color change. Negative for rash and wound.  Neurological: Positive  for syncope and light-headedness. Negative for dizziness, focal weakness, seizures, speech difficulty, weakness, numbness and headaches.  Psychiatric/Behavioral: Negative for agitation and confusion.  All other systems reviewed and are negative.   Physical Exam Updated Vital Signs BP 121/62 (BP Location: Right Arm)   Pulse 68   Temp 98.7 F (37.1 C) (Oral)   Resp 19   Ht 5\' 7"  (1.702 m)   Wt 69.4 kg   SpO2 98%   BMI 23.96 kg/m   Physical Exam Vitals and nursing note reviewed.  Constitutional:      General: He is not in acute distress.    Appearance: He is well-developed. He is not ill-appearing, toxic-appearing or diaphoretic.  HENT:     Head:     Comments: Bruising on the back of the head with a bump.  No crepitance.    Nose: Congestion present. No rhinorrhea.     Mouth/Throat:     Mouth: Mucous membranes are dry.     Pharynx: No oropharyngeal exudate or posterior oropharyngeal erythema.  Eyes:     Extraocular Movements: Extraocular movements intact.     Conjunctiva/sclera: Conjunctivae normal.     Pupils: Pupils are equal, round, and reactive to light.  Cardiovascular:     Rate and Rhythm: Normal rate and regular rhythm.     Pulses: Normal pulses.     Heart sounds: No murmur heard.   Pulmonary:     Effort: Pulmonary effort is normal. No respiratory distress.     Breath sounds: Normal breath sounds. No wheezing, rhonchi or rales.  Chest:     Chest wall: No tenderness.  Abdominal:     General: Abdomen is flat.     Palpations: Abdomen is soft.     Tenderness: There is no abdominal tenderness. There is no right CVA tenderness, left CVA tenderness, guarding or rebound.  Musculoskeletal:        General: No tenderness.     Cervical back: Neck supple. No rigidity or tenderness.     Right lower leg: No edema.     Left lower leg: No edema.  Skin:    General: Skin is warm and dry.     Capillary Refill: Capillary refill takes less than 2 seconds.     Coloration: Skin is  pale.     Findings: No erythema or rash.  Neurological:     General: No focal deficit present.     Mental Status: He is alert and oriented to person, place, and time.     Cranial Nerves: No cranial nerve deficit.     Sensory: No sensory deficit.     Motor: No weakness.  Psychiatric:        Mood and Affect: Mood normal.     ED Results / Procedures / Treatments   Labs (all labs ordered are listed, but only abnormal results are displayed) Labs Reviewed  CBC WITH DIFFERENTIAL/PLATELET - Abnormal; Notable for the following components:  Result Value   WBC 21.2 (*)    RBC 4.18 (*)    Hemoglobin 12.5 (*)    Neutro Abs 18.9 (*)    Monocytes Absolute 1.2 (*)    Abs Immature Granulocytes 0.11 (*)    All other components within normal limits  COMPREHENSIVE METABOLIC PANEL - Abnormal; Notable for the following components:   Sodium 131 (*)    CO2 21 (*)    Glucose, Bld 132 (*)    BUN 29 (*)    Creatinine, Ser 1.41 (*)    GFR calc non Af Amer 48 (*)    GFR calc Af Amer 56 (*)    All other components within normal limits  LACTIC ACID, PLASMA - Abnormal; Notable for the following components:   Lactic Acid, Venous 2.0 (*)    All other components within normal limits  URINALYSIS, ROUTINE W REFLEX MICROSCOPIC - Abnormal; Notable for the following components:   Hgb urine dipstick TRACE (*)    Protein, ur 30 (*)    Leukocytes,Ua SMALL (*)    All other components within normal limits  LIPASE, BLOOD - Abnormal; Notable for the following components:   Lipase 255 (*)    All other components within normal limits  URINALYSIS, MICROSCOPIC (REFLEX) - Abnormal; Notable for the following components:   Bacteria, UA FEW (*)    All other components within normal limits  SARS CORONAVIRUS 2 BY RT PCR (HOSPITAL ORDER, PERFORMED IN [facility])  URINE CULTURE  LACTIC ACID, PLASMA  TSH  MAGNESIUM  LACTIC ACID, PLASMA  LACTIC ACID, PLASMA  PROTIME-INR  COMPREHENSIVE METABOLIC PANEL    MAGNESIUM  TROPONIN I (HIGH SENSITIVITY)  TROPONIN I (HIGH SENSITIVITY)    EKG EKG Interpretation  Date/Time:  Wednesday November 14 2019 14:43:19 EDT Ventricular Rate:  66 PR Interval:    QRS Duration: 100 QT Interval:  410 QTC Calculation: 430 R Axis:   173 Text Interpretation: Sinus rhythm Left posterior fascicular block Minimal ST elevation, inferior leads No significant change since last tracing Confirmed by Jacalyn Lefevre [phone]) on 11/14/2019 3:00:38 PM   Radiology CT Head Wo Contrast  Result Date: 11/14/2019 CLINICAL DATA:  Head trauma, moderate/severe, hit head. Additional provided: Patient treated for UTI for 1 week, weakness today, diarrhea, fell backwards due to syncope. EXAM: CT HEAD WITHOUT CONTRAST TECHNIQUE: Contiguous axial images were obtained from the base of the skull through the vertex without intravenous contrast. COMPARISON:  Brain MRI 01/16/2019 FINDINGS: Brain: Stable, moderate generalized parenchymal atrophy Redemonstrated chronic cortical/subcortical infarct within the right frontal lobe. Stable background mild multifocal hypoattenuation within the cerebral white matter which is nonspecific, but consistent with chronic small vessel ischemic disease. There is no acute intracranial hemorrhage. No demarcated cortical infarct. No extra-axial fluid collection. No evidence of intracranial mass. No midline shift. Vascular: No hyperdense vessel. Skull: Normal. Negative for fracture or focal lesion. Sinuses/Orbits: Visualized orbits show no acute finding. No significant paranasal sinus disease or mastoid effusion at the imaged levels. Other: Left parietal scalp hematoma. IMPRESSION: No evidence of acute intracranial abnormality. Left parietal scalp hematoma. Redemonstrated chronic cortical/subcortical right frontal lobe infarct. Stable background moderate generalized parenchymal atrophy and mild chronic small vessel ischemic disease. Electronically Signed   By: Jackey Loge DO    On: 11/14/2019 16:37   CT Chest Wo Contrast  Result Date: 11/14/2019 CLINICAL DATA:  Leukocytosis, nausea, vomiting, pancreatitis EXAM: CT CHEST WITHOUT CONTRAST AND CT ABDOMEN AND PELVIS WITH CONTRAST TECHNIQUE: Multidetector CT imaging of the  chest, abdomen and pelvis was performed. Initial CT imaging of the chest was performed without contrast administration. Subsequently, CT imaging of the abdomen and pelvis was performed utilizing 80 cc of Omnipaque 300 intravenous iodinated contrast. COMPARISON:  12/18/2013 FINDINGS: CT CHEST FINDINGS Cardiovascular: Extensive multi-vessel coronary artery calcification. Global cardiac size within normal limits. No pericardial effusion. Central pulmonary arteries are enlarged suggesting changes of pulmonary arterial hypertension. Mild atherosclerotic calcification is seen within the thoracic aorta without evidence of aneurysm. Mediastinum/Nodes: No enlarged mediastinal, hilar, or axillary lymph nodes. Thyroid gland, trachea, and esophagus demonstrate no significant findings. Lungs/Pleura: Moderate centrilobular emphysema. Mild right basilar atelectasis. No focal pulmonary nodule or infiltrate. No pneumothorax or pleural effusion. Central airways are widely pa tiny cortical cyst within the upper pole of the left kidney. No hydronephrosis. No intrarenal or ureteral calculi. Gas within the bladder lumen may relate to catheterization, tent. Musculoskeletal: No chest wall mass or suspicious bone lesions identified. CT ABDOMEN PELVIS FINDINGS Hepatobiliary: Liver unremarkable. Gallbladder unremarkable. No intra or extrahepatic biliary ductal dilation. Pancreas: The pancreas is unremarkable. Specifically, there is normal pancreatic parenchymal enhancement. No pancreatic ductal dilation. No pancreatic calcifications are identified. No peripancreatic inflammatory stranding or peripancreatic fluid collections are identified. Spleen: Capsular calcifications involving the lateral  splenic capsule may relate to remote trauma. The spleen is otherwise unremarkable. Adrenals/Urinary Tract: Adrenal glands are unremarkable. Kidneys are normal in size and position. Simple cortical cyst noted within the upper pole of the left kidney. No hydronephrosis. No intrarenal or ureteral calculi. There is moderate gas noted within the bladder lumen. Additionally, there is focal thickening of the bladder wall and intramural gas identified, best noted on axial image # 66/2 and sagittal image # 67/6 suspicious for a colovesicular fistula, possibly result of remote diverticulitis. Stomach/Bowel: There is severe sigmoid diverticulosis without superimposed inflammatory change. Scattered diverticular are seen within the descending colon. Appendectomy has been performed. Stomach and small bowel are unremarkable. No free intraperitoneal gas or fluid. Vascular/Lymphatic: Endovascular repair of an infrarenal abdominal aortic aneurysm utilizing a bifurcated stent graft has been performed there is progressive aneurysmal dilation of the juxtarenal abdominal aorta, measuring 3.6 x 3.3 cm just above the proximal landing zone of the stent graft, previously measuring 3.0 x 2.8 cm. This is best seen on axial image # 26/2 and sagittal image # 61/6. The infrarenal abdominal aortic aneurysm has decreased in maximal caliber measuring 3.7 cm at axial image # 34/2, previously measuring 4.3 cm. Moderate plaque within the lower extremity arterial inflow bilaterally. No pathologic adenopathy within the abdomen and pelvis. Reproductive: Mild prostatic enlargement. Seminal vesicles are unremarkable. Other: Rectum unremarkable. Prominent superficial venous varicosity identified within the anterior visualized right thigh. Musculoskeletal: No acute bone abnormality. IMPRESSION: 1. There is moderate gas within the bladder lumen, with focal thickening of the bladder wall and intramural gas identified. Findings are suspicious for a colovesicular  fistula, possibly result of remote diverticulitis. Correlation with urinalysis is recommended. 2. Endovascular repair of an infrarenal abdominal aortic aneurysm utilizing a bifurcated stent graft. There is progressive aneurysmal dilation of the juxtarenal abdominal aorta, measuring 3.6 x 3.3 cm, previously measuring 3.0 x 2.8 cm. The infrarenal abdominal aortic aneurysm has decreased in maximal caliber measuring 3.7 cm, previously measuring 4.3 cm. 3. Severe sigmoid diverticulosis without superimposed inflammatory change. 4. Moderate centrilobular emphysema. 5. Enlarged central pulmonary arteries suggesting changes of pulmonary arterial hypertension. 6. No CT evidence of acute or chronic pancreatitis. Aortic Atherosclerosis (ICD10-I70.0) and Emphysema (ICD10-J43.9). Electronically Signed   By:  Helyn Numbers MD   On: 11/14/2019 18:08   CT ABDOMEN PELVIS W CONTRAST  Result Date: 11/14/2019 CLINICAL DATA:  Leukocytosis, nausea, vomiting, pancreatitis EXAM: CT CHEST WITHOUT CONTRAST AND CT ABDOMEN AND PELVIS WITH CONTRAST TECHNIQUE: Multidetector CT imaging of the chest, abdomen and pelvis was performed. Initial CT imaging of the chest was performed without contrast administration. Subsequently, CT imaging of the abdomen and pelvis was performed utilizing 80 cc of Omnipaque 300 intravenous iodinated contrast. COMPARISON:  12/18/2013 FINDINGS: CT CHEST FINDINGS Cardiovascular: Extensive multi-vessel coronary artery calcification. Global cardiac size within normal limits. No pericardial effusion. Central pulmonary arteries are enlarged suggesting changes of pulmonary arterial hypertension. Mild atherosclerotic calcification is seen within the thoracic aorta without evidence of aneurysm. Mediastinum/Nodes: No enlarged mediastinal, hilar, or axillary lymph nodes. Thyroid gland, trachea, and esophagus demonstrate no significant findings. Lungs/Pleura: Moderate centrilobular emphysema. Mild right basilar atelectasis. No  focal pulmonary nodule or infiltrate. No pneumothorax or pleural effusion. Central airways are widely pa tiny cortical cyst within the upper pole of the left kidney. No hydronephrosis. No intrarenal or ureteral calculi. Gas within the bladder lumen may relate to catheterization, tent. Musculoskeletal: No chest wall mass or suspicious bone lesions identified. CT ABDOMEN PELVIS FINDINGS Hepatobiliary: Liver unremarkable. Gallbladder unremarkable. No intra or extrahepatic biliary ductal dilation. Pancreas: The pancreas is unremarkable. Specifically, there is normal pancreatic parenchymal enhancement. No pancreatic ductal dilation. No pancreatic calcifications are identified. No peripancreatic inflammatory stranding or peripancreatic fluid collections are identified. Spleen: Capsular calcifications involving the lateral splenic capsule may relate to remote trauma. The spleen is otherwise unremarkable. Adrenals/Urinary Tract: Adrenal glands are unremarkable. Kidneys are normal in size and position. Simple cortical cyst noted within the upper pole of the left kidney. No hydronephrosis. No intrarenal or ureteral calculi. There is moderate gas noted within the bladder lumen. Additionally, there is focal thickening of the bladder wall and intramural gas identified, best noted on axial image # 66/2 and sagittal image # 67/6 suspicious for a colovesicular fistula, possibly result of remote diverticulitis. Stomach/Bowel: There is severe sigmoid diverticulosis without superimposed inflammatory change. Scattered diverticular are seen within the descending colon. Appendectomy has been performed. Stomach and small bowel are unremarkable. No free intraperitoneal gas or fluid. Vascular/Lymphatic: Endovascular repair of an infrarenal abdominal aortic aneurysm utilizing a bifurcated stent graft has been performed there is progressive aneurysmal dilation of the juxtarenal abdominal aorta, measuring 3.6 x 3.3 cm just above the proximal  landing zone of the stent graft, previously measuring 3.0 x 2.8 cm. This is best seen on axial image # 26/2 and sagittal image # 61/6. The infrarenal abdominal aortic aneurysm has decreased in maximal caliber measuring 3.7 cm at axial image # 34/2, previously measuring 4.3 cm. Moderate plaque within the lower extremity arterial inflow bilaterally. No pathologic adenopathy within the abdomen and pelvis. Reproductive: Mild prostatic enlargement. Seminal vesicles are unremarkable. Other: Rectum unremarkable. Prominent superficial venous varicosity identified within the anterior visualized right thigh. Musculoskeletal: No acute bone abnormality. IMPRESSION: 1. There is moderate gas within the bladder lumen, with focal thickening of the bladder wall and intramural gas identified. Findings are suspicious for a colovesicular fistula, possibly result of remote diverticulitis. Correlation with urinalysis is recommended. 2. Endovascular repair of an infrarenal abdominal aortic aneurysm utilizing a bifurcated stent graft. There is progressive aneurysmal dilation of the juxtarenal abdominal aorta, measuring 3.6 x 3.3 cm, previously measuring 3.0 x 2.8 cm. The infrarenal abdominal aortic aneurysm has decreased in maximal caliber measuring 3.7 cm, previously measuring  4.3 cm. 3. Severe sigmoid diverticulosis without superimposed inflammatory change. 4. Moderate centrilobular emphysema. 5. Enlarged central pulmonary arteries suggesting changes of pulmonary arterial hypertension. 6. No CT evidence of acute or chronic pancreatitis. Aortic Atherosclerosis (ICD10-I70.0) and Emphysema (ICD10-J43.9). Electronically Signed   By: Helyn Numbers MD   On: 11/14/2019 18:08   DG Chest Portable 1 View  Result Date: 11/14/2019 CLINICAL DATA:  Syncope, cough, fatigue EXAM: PORTABLE CHEST 1 VIEW COMPARISON:  10/29/2016 FINDINGS: The lungs are symmetrically hyperinflated in keeping with changes of underlying COPD. No superimposed focal  pulmonary infiltrate. Linear lucencies at the right apex in along the right lateral mid lung zone likely represent skin folds, however, a small apical pneumothorax is difficult to exclude on this examination. No pneumothorax on the left. No pleural effusion. Cardiac size within normal limits. Pulmonary vascularity normal. No acute bone abnormality. IMPRESSION: 1. Findings of COPD. 2. Linear lucencies at the right apex and along the right lateral mid lung zone likely represent skin folds, however, a small apical pneumothorax is difficult to exclude on this examination. A standard two view chest radiograph in the expiratory phase or a left lateral decubitus chest radiograph would be helpful in excluding a a pneumothorax. Electronically Signed   By: Helyn Numbers MD   On: 11/14/2019 16:34    Procedures Procedures (including critical care time)  CRITICAL CARE Performed by: Canary Brim Bharat Antillon Total critical care time: 35 minutes Critical care time was exclusive of separately billable procedures and treating other patients. Critical care was necessary to treat or prevent imminent or life-threatening deterioration. Critical care was time spent personally by me on the following activities: development of treatment plan with patient and/or surrogate as well as nursing, discussions with consultants, evaluation of patient's response to treatment, examination of patient, obtaining history from patient or surrogate, ordering and performing treatments and interventions, ordering and review of laboratory studies, ordering and review of radiographic studies, pulse oximetry and re-evaluation of patient's condition.   Medications Ordered in ED Medications  0.9 %  sodium chloride infusion (has no administration in time range)  donepezil (ARICEPT) tablet 10 mg (has no administration in time range)  heparin injection 5,000 Units (has no administration in time range)  acetaminophen (TYLENOL) tablet 650 mg (has no  administration in time range)    Or  acetaminophen (TYLENOL) suppository 650 mg (has no administration in time range)  oxyCODONE (Oxy IR/ROXICODONE) immediate release tablet 5 mg (has no administration in time range)  ondansetron (ZOFRAN) tablet 4 mg (has no administration in time range)    Or  ondansetron (ZOFRAN) injection 4 mg (has no administration in time range)  LORazepam (ATIVAN) tablet 1-4 mg (has no administration in time range)    Or  LORazepam (ATIVAN) injection 1-4 mg (has no administration in time range)  thiamine tablet 100 mg (has no administration in time range)    Or  thiamine (B-1) injection 100 mg (has no administration in time range)  folic acid (FOLVITE) tablet 1 mg (has no administration in time range)  multivitamin with minerals tablet 1 tablet (has no administration in time range)  LORazepam (ATIVAN) injection 0-4 mg (has no administration in time range)    Followed by  LORazepam (ATIVAN) injection 0-4 mg (has no administration in time range)  piperacillin-tazobactam (ZOSYN) IVPB 3.375 g (has no administration in time range)  sodium chloride 0.9 % bolus 1,000 mL (0 mLs Intravenous Stopped 11/14/19 1705)  iohexol (OMNIPAQUE) 300 MG/ML solution 100 mL (100 mLs Intravenous  Contrast Given 11/14/19 1717)  piperacillin-tazobactam (ZOSYN) IVPB 3.375 g ( Intravenous Stopped 11/14/19 2018)    ED Course  I have reviewed the triage vital signs and the nursing notes.  Pertinent labs & imaging results that were available during my care of the patient were reviewed by me and considered in my medical decision making (see chart for details).    MDM Rules/Calculators/A&P                          [name] Juarez is a 74 y.o. male with a past medical history significant for hypertension, hyperlipidemia, varicose veins, small 3.2 cm AAA currently being monitored by vascular surgery, prior appendectomy, prior kidney disease, and currently on antibiotics for urinary tract infection who  presents with a syncopal episode, fatigue, diarrhea, nausea, and cough.  Patient reports that for the last several days, he has had some cough and fatigue.  He reports that he has had some nausea and had intermittent waxing waning diarrhea over the last several days.  He says that today, he stood up to go the kitchen and after getting the kitchen, he had a syncopal episode falling hitting the back of his head on the kitchen floor.  He reportedly felt hot and flushed all over before he passed out but not any chest pain, palpitations, shortness of breath.  He reports he has had some cough that is been nonproductive.  He has had some congestion and drainage.  [facility] but is had some intermittent episodes of diarrhea and nausea.  He denies any abdominal pain or chest pain.  He reports some mild headache after the fall but reports that is improved.  He has a bump and bruise on the back of his head after falling.  He is not on blood thinners but does take aspirin for antiplatelet management.  He is currently finishing up antibiotics and has 1 day left for UTI that was discovered last week.  He says he is still having some very mild dysuria and frequency.  He reports is improving.  He denies any blood in his stool denies any episodes of emesis.  He denies any recent food changes.  He did report that he was lightheaded after sitting up with EMS again.  He denies history of seizure, tongue biting, or loss of bowel or bladder control after the episode of syncope.  He was disoriented for a short period of time but did not have a prolonged confused period according to the wife.  Doubt he was postictal.  On exam, lungs are clear and chest is nontender.  Abdomen is nontender.  Back is nontender neck is nontender.  Patient has a small bump and bruise on the back of his occiput.  No focal neurologic deficits on my initial exam with normal sensation and strength in extremities.  Normal finger-nose-finger  testing bilaterally.  Clear speech.  Symmetric smile.  Pupils are symmetric and reactive, extraocular movements.  Legs have normal sensation and strength.  Patient otherwise well-appearing with reassuring vital signs on arrival.  EKG appears similar to prior with no STEMI.  Had a shared decision made conversation with patient and wife.  I suspect his syncopal episode was more related to orthostatic and dehydration as he has had the intermittent diarrhea as recently had decreased appetite in the setting of UTI.  His mouth does appear dry on mucous membrane exam.  We will get screening labs and give him some  fluids for this.  We agreed to get a work-up to look for worsening UTI as well as an infection in his lungs with his cough.  We will check for Covid despite vaccination given the ongoing and worsening pandemic.  We will get a CT head to look for injury.  I have a low suspicion for aortic cause of his symptoms given his lack of abdominal pain or neurologic deficits in his legs.  Based on the recent ultrasound showing a 3.1 x 3.2 cm aneurysm, they recommended a 3-year surveillance ultrasound.  Do not feel needs further imaging at this time today.  Anticipate reassess after work-up to determine disposition.  Work-up began to return showing a leukocytosis of 21.2.  Patient also has elevated lipase of 255.  Unclear etiology of these 2 findings however patient is still having some nausea.  Due to these abnormalities, will get a CT abdomen pelvis to look for intra-abdominal abscess or other acute abnormality.  Covid test negative.  CT head showed no acute fracture or traumatic injury of the head including no intracranial hemorrhage.  X-ray of the chest showed possible pneumothorax, discussed with radiology they agreed with CT would better characterize and look for other cause of leukocytosis which is occult pneumonia on x-ray.  Patient will get CT of the chest left abdomen/pelvis.  I do anticipate discharge  after things reassuring he is feeling better after the fluids.  7:40 PM Patient CT scan returned showing evidence of a colovesicular fistula likely contributing to the urinary tract infections, rising lactic, leukocytosis, and syncope.   General surgery was called and I spoke to Dr. Fredricka Bonine.  Dr. Doylene Canard agrees with IV antibiotics and admission to medicine service and they will see the patient in consultation to discuss surgical management.  CT scan of the chest did not show pneumothorax and I do not feel there are any traumatic injuries contrary to his symptoms today after the syncope.  Patient will be admitted for further management.    Final Clinical Impression(s) / ED Diagnoses Final diagnoses:  Syncope and collapse  Acute cystitis without hematuria  Colovesical fistula   Clinical Impression: 1. Syncope and collapse   2. Acute cystitis without hematuria   3. Colovesical fistula     Disposition: Admit  This note was prepared with assistance of Dragon voice recognition software. Occasional wrong-word or sound-a-like substitutions may have occurred due to the inherent limitations of voice recognition software.     Brindy Higginbotham, Canary Brim, MD 11/14/19 [phone]

## 2019-11-14 NOTE — H&P (Signed)
TRH H&P    Patient Demographics:    [name] Juarez, is a 74 y.o. male  MRN: [id number]  DOB - June 04, 1945  Admit Date - 11/14/2019  Referring MD/NP/PA: [facility]  Outpatient Primary MD for the patient is Johny Blamer, MD  Patient coming from: Home  Chief complaint- Syncope   HPI:    [name] Juarez  is a 74 y.o. male, with history of dementia, abdominal aortic aneurysm status post repair, CKD stage III, who presents to the ED with a chief complaint of syncope.  Patient reports that he was eating lunch when he suddenly started heaving, felt red and flushed, and woke up on the floor.  He has no idea how long he was out.  He reports he hit the back of his head.  CT shows no acute pathology in the head.  Patient denies any change in vision or change in hearing at this time.  Patient reports there was no preceding chest pain, palpitations, he had not just gone from sitting to standing.  Patient reports that he has never passed out before.  He does not have any heart arrhythmia or pathology that he knows of.  In the days leading up to the syncope he does report that he has been having diarrhea.  No blood in the diarrhea.  His urine has had gas bubbles in the flow.  He has seen no blood or feces in the urine.  Patient has not been feverish.  During my exam he gives a slight cough he reports he is just clearing his throat and that is normal for him.  In the ED Temp 98.7, blood pressure 138/59, heart rate 76, respiratory 25, 95% on room air. CT abdomen pelvis concerning for colovesicular fistula, severe sigmoid diverticulosis, infrarenal AAA status post repair. Leukocytosis of 21.2 Creatinine of 1.4 close to baseline Troponin 4 x2 UA shows trace hemoglobin, small nitrites, few bacteria, and 21-50 white blood cells Urine culture pending Lactic acid was 1.6, and then 2.0. GEN surge consulted advised starting Zosyn -Zosyn started in ER    Review of systems:    Review of Systems  Constitutional: Negative for chills, fever and malaise/fatigue.  HENT: Negative for congestion, hearing loss, sinus pain and tinnitus.   Eyes: Negative for pain and discharge.  Respiratory: Negative for cough and shortness of breath.   Cardiovascular: Negative for chest pain, palpitations and leg swelling.  Gastrointestinal: Positive for diarrhea. Negative for abdominal pain, blood in stool, constipation, melena, nausea and vomiting.  Genitourinary: Positive for dysuria and urgency. Negative for frequency and hematuria.  Musculoskeletal: Negative for falls and myalgias.  Neurological: Positive for loss of consciousness and weakness. Negative for dizziness and headaches.  Psychiatric/Behavioral: Negative for substance abuse.     All other systems reviewed and are negative.    Past History of the following :    Past Medical History:  Diagnosis Date  . Arthritis   . Colon polyp 10/2006  . Erectile dysfunction   . Hyperlipidemia   . Hypertension   . Tuberculosis  age 59  . Varicose veins       Past Surgical History:  Procedure Laterality Date  . ABDOMINAL AORTIC ENDOVASCULAR STENT GRAFT  11/03/2011  . APPENDECTOMY    . INGUINAL HERNIA REPAIR Right   . KNEE SURGERY Right   . LAPAROSCOPIC APPENDECTOMY N/A 08/03/2013   Procedure: APPENDECTOMY LAPAROSCOPIC;  Surgeon: Velora Heckler, MD;  Location: [facility];  Service: General;  Laterality: N/A;      Social History:      Social History   Tobacco Use  . Smoking status: Former Smoker    Years: 45.00    Types: Cigarettes    Quit date: 11/05/2011    Years since quitting: 8.0  . Smokeless tobacco: Never Used  Substance Use Topics  . Alcohol use: Yes    Alcohol/week: 25.0 standard drinks    Types: 4 Glasses of wine, 21 Cans of beer per week       Family History :     Family History  Problem Relation Age of Onset  . Tuberculosis Father   . Tuberculosis Sister    Reviewed    Home Medications:   Prior to Admission medications   Medication Sig Start Date End Date Taking? Authorizing Provider  acetaminophen (TYLENOL) 325 MG tablet Take 2 tablets (650 mg total) by mouth every 4 (four) hours as needed for mild pain, fever or headache (Do not take more than 4000 mg of Tylenol (acetaminophen) daily.  This is in your prescription pain medicine.). 08/08/13  Yes Sherrie George, PA-C  aspirin EC 81 MG tablet Take 81 mg by mouth daily.   Yes [provider]  diphenoxylate-atropine (LOMOTIL) 2.5-0.025 MG tablet Take 1 tablet by mouth 4 (four) times daily as needed for diarrhea or loose stools.   Yes [provider]  donepezil (ARICEPT) 10 MG tablet Take 10 mg by mouth daily.   Yes [provider]  loperamide (IMODIUM A-D) 2 MG tablet Take 1 mg by mouth as needed for diarrhea or loose stools.   Yes [provider]  nitrofurantoin, macrocrystal-monohydrate, (MACROBID) 100 MG capsule Take 100 mg by mouth 2 (two) times daily.   Yes [provider]     Allergies:    No Known Allergies   Physical Exam:   Vitals  Blood pressure (!) 138/59, pulse 85, temperature 99.5 F (37.5 C), temperature source Oral, resp. rate 18, height 5\' 7"  (1.702 m), weight 69.4 kg, SpO2 95 %.  1.  General: Lying supine in bed at the time of my exam waiting for my exit we can call his wife  2. Psychiatric: Mood and behavior normal for situation  3. Neurologic: Cranial nerves II through XII are grossly intact, no focal deficit on limited exam Alert and oriented x3  4. HEENMT:  Head is atraumatic, normocephalic, pupils are reactive to light, trachea is midline, neck is supple, mucous membranes are moist  5. Respiratory : Lungs are clear to auscultation bilaterally  6. Cardiovascular : Heart rate is normal, rhythm is regular, no murmurs or rubs  7. Gastrointestinal:  Abdomen is soft, nondistended, nontender to palpation  8. Skin:  No acute  lesions on limited skin exam  9.Musculoskeletal:  No acute deformity, no peripheral edema    Data Review:    CBC Recent Labs  Lab 11/14/19 1536  WBC 21.2*  HGB 12.5*  HCT 39.5  PLT 340  MCV 94.5  MCH 29.9  MCHC 31.6  RDW 13.3  LYMPHSABS 0.9  MONOABS 1.2*  EOSABS 0.0  BASOSABS 0.1   ------------------------------------------------------------------------------------------------------------------  Results for orders placed or performed during the hospital encounter of 11/14/19 (from the past 48 hour(s))  CBC with Differential     Status: Abnormal   Collection Time: 11/14/19  3:36 PM  Result Value Ref Range   WBC 21.2 (H) 4.0 - 10.5 K/uL   RBC 4.18 (L) 4.22 - 5.81 MIL/uL   Hemoglobin 12.5 (L) 13.0 - 17.0 g/dL   HCT 86.5 39 - 52 %   MCV 94.5 80.0 - 100.0 fL   MCH 29.9 26.0 - 34.0 pg   MCHC 31.6 30.0 - 36.0 g/dL   RDW 78.4 69.6 - 29.5 %   Platelets 340 150 - 400 K/uL   nRBC 0.0 0.0 - 0.2 %   Neutrophils Relative % 89 %   Neutro Abs 18.9 (H) 1.7 - 7.7 K/uL   Lymphocytes Relative 4 %   Lymphs Abs 0.9 0.7 - 4.0 K/uL   Monocytes Relative 6 %   Monocytes Absolute 1.2 (H) 0 - 1 K/uL   Eosinophils Relative 0 %   Eosinophils Absolute 0.0 0 - 0 K/uL   Basophils Relative 0 %   Basophils Absolute 0.1 0 - 0 K/uL   Immature Granulocytes 1 %   Abs Immature Granulocytes 0.11 (H) 0.00 - 0.07 K/uL    Comment: Performed at [facility], [address] [place], [place] [address]  Comprehensive metabolic panel     Status: Abnormal   Collection Time: 11/14/19  3:36 PM  Result Value Ref Range   Sodium 131 (L) 135 - 145 mmol/L   Potassium 4.3 3.5 - 5.1 mmol/L   Chloride 100 98 - 111 mmol/L   CO2 21 (L) 22 - 32 mmol/L   Glucose, Bld 132 (H) 70 - 99 mg/dL    Comment: Glucose reference range applies only to samples taken after fasting for at least 8 hours.   BUN 29 (H) 8 - 23 mg/dL   Creatinine, Ser 2.44 (H) 0.61 - 1.24 mg/dL   Calcium 9.0 8.9 - 01.0 mg/dL   Total  Protein 7.7 6.5 - 8.1 g/dL   Albumin 4.4 3.5 - 5.0 g/dL   AST 23 15 - 41 U/L   ALT 26 0 - 44 U/L   Alkaline Phosphatase 60 38 - 126 U/L   Total Bilirubin 0.5 0.3 - 1.2 mg/dL   GFR calc non Af Amer 48 (L) >60 mL/min   GFR calc Af Amer 56 (L) >60 mL/min   Anion gap 10 5 - 15    Comment: Performed at [facility], [address] [place], [place] [address]  Troponin I (High Sensitivity)     Status: None   Collection Time: 11/14/19  3:36 PM  Result Value Ref Range   Troponin I (High Sensitivity) 4 <18 ng/L    Comment: (NOTE) Elevated high sensitivity troponin I (hsTnI) values and significant  changes across serial measurements may suggest ACS but many other  chronic and acute conditions are known to elevate hsTnI results.  Refer to the "Links" section for chest pain algorithms and additional  guidance. Performed at [facility], [address] [place], [place] [address]   Lactic acid, plasma     Status: None   Collection Time: 11/14/19  3:36 PM  Result Value Ref Range   Lactic Acid, Venous 1.6 0.5 - 1.9 mmol/L    Comment: Performed at [facility], [address] [place], [place] [address]  Urinalysis, Routine w reflex microscopic Urine, Clean Catch     Status: Abnormal   Collection Time: 11/14/19  3:36 PM  Result Value Ref Range   Color, Urine YELLOW YELLOW   APPearance CLEAR CLEAR   Specific Gravity, Urine 1.025 1.005 - 1.030   pH 5.5 5.0 - 8.0   Glucose, UA NEGATIVE NEGATIVE mg/dL   Hgb urine dipstick TRACE (A) NEGATIVE   Bilirubin Urine NEGATIVE NEGATIVE   Ketones, ur NEGATIVE NEGATIVE mg/dL   Protein, ur 30 (A) NEGATIVE mg/dL   Nitrite NEGATIVE NEGATIVE   Leukocytes,Ua SMALL (A) NEGATIVE    Comment: Performed at [facility], [address] [place], [place] [address]  Lipase, blood     Status: Abnormal   Collection Time: 11/14/19  3:36 PM  Result Value Ref Range   Lipase 255 (H) 11 - 51 U/L    Comment: Performed at [facility], [address] [place], [place] [address]  TSH     Status: None   Collection Time: 11/14/19  3:36 PM  Result Value Ref Range   TSH 1.526 0.350 - 4.500 uIU/mL    Comment: Performed by a 3rd Generation assay with a functional sensitivity of <=0.01 uIU/mL. Performed at [facility], [address], [place], [place] [address]   SARS Coronavirus 2 by RT PCR (hospital order, performed in [facility] hospital lab) Nasopharyngeal Nasopharyngeal Swab     Status: None   Collection Time: 11/14/19  3:36 PM   Specimen: Nasopharyngeal Swab  Result Value Ref Range   SARS Coronavirus 2 NEGATIVE NEGATIVE    Comment: (NOTE) SARS-CoV-2 target nucleic acids are NOT DETECTED.  The SARS-CoV-2 RNA is generally detectable in upper and lower respiratory specimens during the acute phase of infection. The lowest concentration of SARS-CoV-2 viral copies this assay can detect is 250 copies / mL. A negative result does not preclude SARS-CoV-2 infection and should not be used as the sole basis for treatment or other patient management decisions.  A negative result may occur with improper specimen collection / handling, submission of specimen other than nasopharyngeal swab, presence of viral mutation(s) within the areas targeted by this assay, and inadequate number of viral copies (<250 copies / mL). A negative result must be combined with clinical observations, patient history, and epidemiological information.  Fact Sheet for Patients:   BoilerBrush.com.cy  Fact Sheet for Healthcare Providers: https://pope.com/  This test is not yet approved or  cleared by the [place] FDA and has been authorized for detection and/or diagnosis of SARS-CoV-2 by FDA under an Emergency Use Authorization (EUA).  This EUA will remain in effect (meaning this test can be used) for the duration of the COVID-19 declaration under Section 564(b)(1) of the  Act, 21 U.S.C. section 360bbb-3(b)(1), unless the authorization is terminated or revoked sooner.  Performed at [facility], [address], [place], [place] [address]   Magnesium     Status: None   Collection Time: 11/14/19  3:36 PM  Result Value Ref Range   Magnesium 1.9 1.7 - 2.4 mg/dL    Comment: Performed at [facility], [address] [place], [place] [address]  Urinalysis, Microscopic (reflex)     Status: Abnormal   Collection Time: 11/14/19  3:36 PM  Result Value Ref Range   RBC / HPF 6-10 0 - 5 RBC/hpf   WBC, UA 21-50 0 -  5 WBC/hpf   Bacteria, UA FEW (A) NONE SEEN   Squamous Epithelial / LPF 0-5 0 - 5   WBC Clumps PRESENT     Comment: Performed at [facility], [address] [place], [place] [address]  Lactic acid, plasma     Status: Abnormal   Collection Time: 11/14/19  5:50 PM  Result Value Ref Range   Lactic Acid, Venous 2.0 (HH) 0.5 - 1.9 mmol/L    Comment: CRITICAL RESULT CALLED TO, READ BACK BY AND VERIFIED WITH: Cordie Grice RN @1830  11/14/2019 OLSONM Performed at [facility], [address] [place], [place] [address]   Troponin I (High Sensitivity)     Status: None   Collection Time: 11/14/19  5:50 PM  Result Value Ref Range   Troponin I (High Sensitivity) 4 <18 ng/L    Comment: (NOTE) Elevated high sensitivity troponin I (hsTnI) values and significant  changes across serial measurements may suggest ACS but many other  chronic and acute conditions are known to elevate hsTnI results.  Refer to the "Links" section for chest pain algorithms and additional  guidance. Performed at [facility], [address] [place], [place] [address]     Chemistries  Recent Labs  Lab 11/14/19 1536  NA 131*  K 4.3  CL 100  CO2 21*  GLUCOSE 132*  BUN 29*  CREATININE 1.41*  CALCIUM 9.0  MG 1.9  AST 23  ALT 26  ALKPHOS 60  BILITOT 0.5    ------------------------------------------------------------------------------------------------------------------  ------------------------------------------------------------------------------------------------------------------ GFR: Estimated Creatinine Clearance: 42.3 mL/min (A) (by C-G formula based on SCr of 1.41 mg/dL (H)). Liver Function Tests: Recent Labs  Lab 11/14/19 1536  AST 23  ALT 26  ALKPHOS 60  BILITOT 0.5  PROT 7.7  ALBUMIN 4.4   Recent Labs  Lab 11/14/19 1536  LIPASE 255*   No results for input(s): AMMONIA in the last 168 hours. Coagulation Profile: No results for input(s): INR, PROTIME in the last 168 hours. Cardiac Enzymes: No results for input(s): CKTOTAL, CKMB, CKMBINDEX, TROPONINI in the last 168 hours. BNP (last 3 results) No results for input(s): PROBNP in the last 8760 hours. HbA1C: No results for input(s): HGBA1C in the last 72 hours. CBG: No results for input(s): GLUCAP in the last 168 hours. Lipid Profile: No results for input(s): CHOL, HDL, LDLCALC, TRIG, CHOLHDL, LDLDIRECT in the last 72 hours. Thyroid Function Tests: Recent Labs    11/14/19 1536  TSH 1.526   Anemia Panel: No results for input(s): VITAMINB12, FOLATE, FERRITIN, TIBC, IRON, RETICCTPCT in the last 72 hours.  --------------------------------------------------------------------------------------------------------------- Urine analysis:    Component Value Date/Time   COLORURINE YELLOW 11/14/2019 1536   APPEARANCEUR CLEAR 11/14/2019 1536   LABSPEC 1.025 11/14/2019 1536   PHURINE 5.5 11/14/2019 1536   GLUCOSEU NEGATIVE 11/14/2019 1536   HGBUR TRACE (A) 11/14/2019 1536   BILIRUBINUR NEGATIVE 11/14/2019 1536   KETONESUR NEGATIVE 11/14/2019 1536   PROTEINUR 30 (A) 11/14/2019 1536   UROBILINOGEN 0.2 11/01/2011 1522   NITRITE NEGATIVE 11/14/2019 1536   LEUKOCYTESUR SMALL (A) 11/14/2019 1536      Imaging Results:    CT Head Wo Contrast  Result Date: 11/14/2019  CLINICAL DATA:  Head trauma, moderate/severe, hit head. Additional provided: Patient treated for UTI for 1 week, weakness today, diarrhea, fell backwards due to syncope. EXAM: CT HEAD WITHOUT CONTRAST TECHNIQUE: Contiguous axial images were obtained from the base of the skull through the vertex without intravenous contrast. COMPARISON:  Brain MRI 01/16/2019 FINDINGS: Brain: Stable, moderate generalized parenchymal atrophy Redemonstrated chronic cortical/subcortical infarct within the right frontal lobe. Stable background mild multifocal hypoattenuation within the cerebral white matter which is nonspecific, but consistent with chronic small vessel ischemic disease. There is no acute intracranial hemorrhage. No demarcated cortical infarct. No extra-axial fluid collection. No evidence of intracranial mass. No midline shift. Vascular: No hyperdense vessel. Skull: Normal. Negative for fracture or focal lesion. Sinuses/Orbits: Visualized orbits show no acute finding. No significant paranasal sinus disease or mastoid effusion at the imaged levels. Other: Left parietal scalp hematoma. IMPRESSION: No evidence of acute intracranial abnormality. Left parietal scalp hematoma. Redemonstrated chronic cortical/subcortical right frontal lobe infarct. Stable background moderate generalized parenchymal atrophy and mild chronic small vessel ischemic disease. Electronically Signed   By: Jackey Loge DO   On: 11/14/2019 16:37   CT Chest Wo Contrast  Result Date: 11/14/2019 CLINICAL DATA:  Leukocytosis, nausea, vomiting, pancreatitis EXAM: CT CHEST WITHOUT CONTRAST AND CT ABDOMEN AND PELVIS WITH CONTRAST TECHNIQUE: Multidetector CT imaging of the chest, abdomen and pelvis was performed. Initial CT imaging of the chest was performed without contrast administration. Subsequently, CT imaging of the abdomen and pelvis was performed utilizing 80 cc of Omnipaque 300 intravenous iodinated contrast. COMPARISON:  12/18/2013 FINDINGS: CT  CHEST FINDINGS Cardiovascular: Extensive multi-vessel coronary artery calcification. Global cardiac size within normal limits. No pericardial effusion. Central pulmonary arteries are enlarged suggesting changes of pulmonary arterial hypertension. Mild atherosclerotic calcification is seen within the thoracic aorta without evidence of aneurysm. Mediastinum/Nodes: No enlarged mediastinal, hilar, or axillary lymph nodes. Thyroid gland, trachea, and esophagus demonstrate no significant findings. Lungs/Pleura: Moderate centrilobular emphysema. Mild right basilar atelectasis. No focal pulmonary nodule or infiltrate. No pneumothorax or pleural effusion. Central airways are widely pa tiny cortical cyst within the upper pole of the left kidney. No hydronephrosis. No intrarenal or ureteral calculi. Gas within the bladder lumen may relate to catheterization, tent. Musculoskeletal: No chest wall mass or suspicious bone lesions identified. CT ABDOMEN PELVIS FINDINGS Hepatobiliary: Liver unremarkable. Gallbladder unremarkable. No intra or extrahepatic biliary ductal dilation. Pancreas: The pancreas is unremarkable. Specifically, there is normal pancreatic parenchymal enhancement. No pancreatic ductal dilation. No pancreatic calcifications are identified. No peripancreatic inflammatory stranding or peripancreatic fluid collections are identified. Spleen: Capsular calcifications involving the lateral splenic capsule may relate to remote trauma. The spleen is otherwise unremarkable. Adrenals/Urinary Tract: Adrenal glands are unremarkable. Kidneys are normal in size and position. Simple cortical cyst noted within the upper pole of the left kidney. No hydronephrosis. No intrarenal or ureteral calculi. There is moderate gas noted within the bladder lumen. Additionally, there is focal thickening of the bladder wall and intramural gas identified, best noted on axial image # 66/2 and sagittal image # 67/6 suspicious for a colovesicular  fistula, possibly result of remote diverticulitis. Stomach/Bowel: There is severe sigmoid diverticulosis without superimposed inflammatory change. Scattered diverticular are seen within the descending colon. Appendectomy has been performed. Stomach and small bowel are unremarkable. No free intraperitoneal gas or fluid. Vascular/Lymphatic: Endovascular repair of an infrarenal abdominal aortic aneurysm utilizing a bifurcated stent graft has been performed there is progressive aneurysmal dilation of the juxtarenal abdominal aorta, measuring 3.6 x 3.3 cm just above the proximal landing zone of the stent graft, previously measuring 3.0 x 2.8 cm. This is best seen on axial image # 26/2 and sagittal image # 61/6. The infrarenal abdominal aortic aneurysm has decreased in maximal caliber measuring 3.7 cm at axial image # 34/2, previously measuring 4.3  cm. Moderate plaque within the lower extremity arterial inflow bilaterally. No pathologic adenopathy within the abdomen and pelvis. Reproductive: Mild prostatic enlargement. Seminal vesicles are unremarkable. Other: Rectum unremarkable. Prominent superficial venous varicosity identified within the anterior visualized right thigh. Musculoskeletal: No acute bone abnormality. IMPRESSION: 1. There is moderate gas within the bladder lumen, with focal thickening of the bladder wall and intramural gas identified. Findings are suspicious for a colovesicular fistula, possibly result of remote diverticulitis. Correlation with urinalysis is recommended. 2. Endovascular repair of an infrarenal abdominal aortic aneurysm utilizing a bifurcated stent graft. There is progressive aneurysmal dilation of the juxtarenal abdominal aorta, measuring 3.6 x 3.3 cm, previously measuring 3.0 x 2.8 cm. The infrarenal abdominal aortic aneurysm has decreased in maximal caliber measuring 3.7 cm, previously measuring 4.3 cm. 3. Severe sigmoid diverticulosis without superimposed inflammatory change. 4.  Moderate centrilobular emphysema. 5. Enlarged central pulmonary arteries suggesting changes of pulmonary arterial hypertension. 6. No CT evidence of acute or chronic pancreatitis. Aortic Atherosclerosis (ICD10-I70.0) and Emphysema (ICD10-J43.9). Electronically Signed   By: Helyn Numbers MD   On: 11/14/2019 18:08   CT ABDOMEN PELVIS W CONTRAST  Result Date: 11/14/2019 CLINICAL DATA:  Leukocytosis, nausea, vomiting, pancreatitis EXAM: CT CHEST WITHOUT CONTRAST AND CT ABDOMEN AND PELVIS WITH CONTRAST TECHNIQUE: Multidetector CT imaging of the chest, abdomen and pelvis was performed. Initial CT imaging of the chest was performed without contrast administration. Subsequently, CT imaging of the abdomen and pelvis was performed utilizing 80 cc of Omnipaque 300 intravenous iodinated contrast. COMPARISON:  12/18/2013 FINDINGS: CT CHEST FINDINGS Cardiovascular: Extensive multi-vessel coronary artery calcification. Global cardiac size within normal limits. No pericardial effusion. Central pulmonary arteries are enlarged suggesting changes of pulmonary arterial hypertension. Mild atherosclerotic calcification is seen within the thoracic aorta without evidence of aneurysm. Mediastinum/Nodes: No enlarged mediastinal, hilar, or axillary lymph nodes. Thyroid gland, trachea, and esophagus demonstrate no significant findings. Lungs/Pleura: Moderate centrilobular emphysema. Mild right basilar atelectasis. No focal pulmonary nodule or infiltrate. No pneumothorax or pleural effusion. Central airways are widely pa tiny cortical cyst within the upper pole of the left kidney. No hydronephrosis. No intrarenal or ureteral calculi. Gas within the bladder lumen may relate to catheterization, tent. Musculoskeletal: No chest wall mass or suspicious bone lesions identified. CT ABDOMEN PELVIS FINDINGS Hepatobiliary: Liver unremarkable. Gallbladder unremarkable. No intra or extrahepatic biliary ductal dilation. Pancreas: The pancreas is  unremarkable. Specifically, there is normal pancreatic parenchymal enhancement. No pancreatic ductal dilation. No pancreatic calcifications are identified. No peripancreatic inflammatory stranding or peripancreatic fluid collections are identified. Spleen: Capsular calcifications involving the lateral splenic capsule may relate to remote trauma. The spleen is otherwise unremarkable. Adrenals/Urinary Tract: Adrenal glands are unremarkable. Kidneys are normal in size and position. Simple cortical cyst noted within the upper pole of the left kidney. No hydronephrosis. No intrarenal or ureteral calculi. There is moderate gas noted within the bladder lumen. Additionally, there is focal thickening of the bladder wall and intramural gas identified, best noted on axial image # 66/2 and sagittal image # 67/6 suspicious for a colovesicular fistula, possibly result of remote diverticulitis. Stomach/Bowel: There is severe sigmoid diverticulosis without superimposed inflammatory change. Scattered diverticular are seen within the descending colon. Appendectomy has been performed. Stomach and small bowel are unremarkable. No free intraperitoneal gas or fluid. Vascular/Lymphatic: Endovascular repair of an infrarenal abdominal aortic aneurysm utilizing a bifurcated stent graft has been performed there is progressive aneurysmal dilation of the juxtarenal abdominal aorta, measuring 3.6 x 3.3 cm just above the proximal landing zone  of the stent graft, previously measuring 3.0 x 2.8 cm. This is best seen on axial image # 26/2 and sagittal image # 61/6. The infrarenal abdominal aortic aneurysm has decreased in maximal caliber measuring 3.7 cm at axial image # 34/2, previously measuring 4.3 cm. Moderate plaque within the lower extremity arterial inflow bilaterally. No pathologic adenopathy within the abdomen and pelvis. Reproductive: Mild prostatic enlargement. Seminal vesicles are unremarkable. Other: Rectum unremarkable. Prominent  superficial venous varicosity identified within the anterior visualized right thigh. Musculoskeletal: No acute bone abnormality. IMPRESSION: 1. There is moderate gas within the bladder lumen, with focal thickening of the bladder wall and intramural gas identified. Findings are suspicious for a colovesicular fistula, possibly result of remote diverticulitis. Correlation with urinalysis is recommended. 2. Endovascular repair of an infrarenal abdominal aortic aneurysm utilizing a bifurcated stent graft. There is progressive aneurysmal dilation of the juxtarenal abdominal aorta, measuring 3.6 x 3.3 cm, previously measuring 3.0 x 2.8 cm. The infrarenal abdominal aortic aneurysm has decreased in maximal caliber measuring 3.7 cm, previously measuring 4.3 cm. 3. Severe sigmoid diverticulosis without superimposed inflammatory change. 4. Moderate centrilobular emphysema. 5. Enlarged central pulmonary arteries suggesting changes of pulmonary arterial hypertension. 6. No CT evidence of acute or chronic pancreatitis. Aortic Atherosclerosis (ICD10-I70.0) and Emphysema (ICD10-J43.9). Electronically Signed   By: Helyn Numbers MD   On: 11/14/2019 18:08   DG Chest Portable 1 View  Result Date: 11/14/2019 CLINICAL DATA:  Syncope, cough, fatigue EXAM: PORTABLE CHEST 1 VIEW COMPARISON:  10/29/2016 FINDINGS: The lungs are symmetrically hyperinflated in keeping with changes of underlying COPD. No superimposed focal pulmonary infiltrate. Linear lucencies at the right apex in along the right lateral mid lung zone likely represent skin folds, however, a small apical pneumothorax is difficult to exclude on this examination. No pneumothorax on the left. No pleural effusion. Cardiac size within normal limits. Pulmonary vascularity normal. No acute bone abnormality. IMPRESSION: 1. Findings of COPD. 2. Linear lucencies at the right apex and along the right lateral mid lung zone likely represent skin folds, however, a small apical  pneumothorax is difficult to exclude on this examination. A standard two view chest radiograph in the expiratory phase or a left lateral decubitus chest radiograph would be helpful in excluding a a pneumothorax. Electronically Signed   By: Helyn Numbers MD   On: 11/14/2019 16:34    My personal review of EKG: Rhythm NSR, Rate 66 /min, QTc 430, minimal ST changes in inferior leads - in the setting of no chest pain, and negative troponin   Assessment & Plan:    Active Problems:   Colovesical fistula   1. Syncope 1. Orthostatics daily 2. Monitor on tele 3. Echo in the AM 4. Troponin negative x 2 5. Continue to monitor 2. Colovesicular fistula 1. As seen on CT 2. Gen surg consulted 3. NPO for procedure 4. Continue Zosyn per gen surg recommendations  5. Continue to monitor 3. UTI 1. UA suspicious for UTI, leukocytosis 21.2 2. CT concerning for colovesicular fistula 3. Urine culture pending 4. Continue zosyn 4. Lactic acidosis 1. Initial 1.6, then 2.0, continue to trend 2. 1L bolus in ED, continue IVFs 5. EtOH Use 1. Within recommended use = 7 drinks per week 2. Daily use of liquor 3. CIWA protocol     DVT Prophylaxis-   heparin - SCDs   AM Labs Ordered, also please review Full Orders  Family Communication: No family at bedside Code Status:  Full  Admission status: Inpatient :The appropriate admission  status for this patient is INPATIENT. Inpatient status is judged to be reasonable and necessary in order to provide the required intensity of service to ensure the patient's safety. The patient's presenting symptoms, physical exam findings, and initial radiographic and laboratory data in the context of their chronic comorbidities is felt to place them at high risk for further clinical deterioration. Furthermore, it is not anticipated that the patient will be medically stable for discharge from the hospital within 2 midnights of admission. The following factors support the  admission status of inpatient.     The patient's presenting symptoms include syncope The initial radiographic and laboratory data are worrisome because of Colovesicular fistula. The chronic co-morbidities include CKD III, AAA s/p repair, dementia       * I certify that at the point of admission it is my clinical judgment that the patient will require inpatient hospital care spanning beyond 2 midnights from the point of admission due to high intensity of service, high risk for further deterioration and high frequency of surveillance required.*  Time spent in minutes : 65   Asia B Zierle-Ghosh DO

## 2019-11-15 ENCOUNTER — Inpatient Hospital Stay (HOSPITAL_COMMUNITY): Payer: Medicare Other

## 2019-11-15 DIAGNOSIS — R55 Syncope and collapse: Secondary | ICD-10-CM

## 2019-11-15 LAB — COMPREHENSIVE METABOLIC PANEL
ALT: 23 U/L (ref 0–44)
AST: 17 U/L (ref 15–41)
Albumin: 3.5 g/dL (ref 3.5–5.0)
Alkaline Phosphatase: 48 U/L (ref 38–126)
Anion gap: 8 (ref 5–15)
BUN: 27 mg/dL — ABNORMAL HIGH (ref 8–23)
CO2: 20 mmol/L — ABNORMAL LOW (ref 22–32)
Calcium: 8.3 mg/dL — ABNORMAL LOW (ref 8.9–10.3)
Chloride: 105 mmol/L (ref 98–111)
Creatinine, Ser: 1.57 mg/dL — ABNORMAL HIGH (ref 0.61–1.24)
GFR calc Af Amer: 49 mL/min — ABNORMAL LOW (ref >60)
GFR calc non Af Amer: 42 mL/min — ABNORMAL LOW (ref >60)
Glucose, Bld: 150 mg/dL — ABNORMAL HIGH (ref 70–99)
Potassium: 4.4 mmol/L (ref 3.5–5.1)
Sodium: 133 mmol/L — ABNORMAL LOW (ref 135–145)
Total Bilirubin: 1.1 mg/dL (ref 0.3–1.2)
Total Protein: 6.4 g/dL — ABNORMAL LOW (ref 6.5–8.1)

## 2019-11-15 LAB — C DIFFICILE QUICK SCREEN W PCR REFLEX
C Diff antigen: POSITIVE — AB
C Diff toxin: NEGATIVE

## 2019-11-15 LAB — GASTROINTESTINAL PANEL BY PCR, STOOL (REPLACES STOOL CULTURE)

## 2019-11-15 LAB — ECHOCARDIOGRAM COMPLETE
Area-P 1/2: 3.39 cm2
Height: 67 in
S' Lateral: 2.6 cm
Weight: 2448 oz

## 2019-11-15 LAB — PROTIME-INR
INR: 1.1 (ref 0.8–1.2)
Prothrombin Time: 14.1 seconds (ref 11.4–15.2)

## 2019-11-15 LAB — MAGNESIUM: Magnesium: 2 mg/dL (ref 1.7–2.4)

## 2019-11-15 LAB — URINE CULTURE: Culture: <10000 — AB

## 2019-11-15 LAB — LACTIC ACID, PLASMA: Lactic Acid, Venous: 0.7 mmol/L (ref 0.5–1.9)

## 2019-11-15 LAB — CLOSTRIDIUM DIFFICILE BY PCR, REFLEXED: Toxigenic C. Difficile by PCR: NEGATIVE

## 2019-11-15 LAB — LIPASE, BLOOD: Lipase: 53 U/L — ABNORMAL HIGH (ref 11–51)

## 2019-11-15 NOTE — Progress Notes (Signed)
PROGRESS NOTE    [name] Juarez  ZOX:[id number] DOB: March 12, 1946 DOA: 11/14/2019 PCP: Johny Blamer, MD   Brief Narrative:  Patient is a 74 year old male with history of dementia, abdominal aortic aneurysm status post repair, CKD stage IIIb who presents with complaint of syncopal.  CT head did not show any acute intracranial abnormalities.  Patient reported seeing gas bubbles in the urine.  Hemodynamically stable on presentation.  CT abdomen/pelvis concerning for colovesical fistula, severe sigmoid diverticulosis.  He had leukocytosis on presentation.  General surgery consulted.  Started on Zosyn.  Plan is repeat colonoscopy and possible surgery as an outpatient.  Currently being worked up for syncope, diarrhea.   Assessment & Plan:   Active Problems:   Colovesical fistula   Syncope: Most likely hypovolemic/orthostatic syncope for diarrhea .echocardiogram showed ejection fraction 50 to 55%, no regional wall motion abnormality, no valvular abnormalities.  Troponins negative.  Will check orthostatic vitals.  Monitor on telemetry.  Colovesical fistula: As seen on the CT. showed moderate gas within the bladder lumen/intramural gas.  Etiology could be from remote diverticulitis.  Patient reported seeing gas bubbles in the urine.  General surgery consulted.  Continue Zosyn for now.  General surgery and GI recommending outpatient follow-up for colonoscopy versus surgery.  Diarrhea: Reports diarrhea for the last 6 months.  Follows with Dr. Ewing Schlein.  Recently taking antibiotic at home.  C. difficile antigen is positive, toxin is negative.  Pending C. difficile PCR.  GI pathogen panel pending.  UTI: Urinalysis suspicious for UTI.  He has severe leukocytosis.  Urine culture pending.  Continue current antibiotics.  Patient had mild grade fever last night.  Lactic acidosis: Mild.  Continue gentle IV fluids  Ethanol use: Monitor on CIWA protocol.  On folic acid and thiamine  CKD stage IIIb: Currently kidney  function at baseline.  Baseline creatinine around 1.4  Dementia: On donepezil.  Continue supportive care.  Delirium precautions.  PT/deconditioning/generalized weakness: We will request a PT/OT evaluation.         DVT prophylaxis:Heparin [place] Code Status: Full Family Communication: Wife at the bedside Status is: Inpatient  Remains inpatient appropriate because:IV treatments appropriate due to intensity of illness or inability to take PO   Dispo: The patient is from: Home              Anticipated d/c is to: Home              Anticipated d/c date is: 1-2 days              Patient currently is not medically stable to d/c.     Consultants: Surgery  Procedures:None  Antimicrobials:  Anti-infectives (From admission, onward)   Start     Dose/Rate Route Frequency Ordered Stop   11/15/19 0600  piperacillin-tazobactam (ZOSYN) IVPB 3.375 g     Discontinue     3.375 g 12.5 mL/hr over 240 Minutes Intravenous Every 8 hours 11/14/19 2316     11/14/19 2315  piperacillin-tazobactam (ZOSYN) IVPB 3.375 g  Status:  Discontinued        3.375 g 100 mL/hr over 30 Minutes Intravenous Every 8 hours 11/14/19 2311 11/14/19 2316   11/14/19 1945  piperacillin-tazobactam (ZOSYN) IVPB 3.375 g        3.375 g 100 mL/hr over 30 Minutes Intravenous  Once 11/14/19 1939 11/14/19 2018      Subjective:  Patient seen and examined the bedside this morning.  Hemodynamically stable.  Looks comfortable during my evaluation.  Denies any abdomen  pain, nausea or vomiting but reported 5 episodes of diarrhea.  Wife at the bedside.  Abdomen was soft and nontender on examination.  Objective: Vitals:   11/14/19 2204 11/15/19 0000 11/15/19 0220 11/15/19 0556  BP: (!) 138/59  (!) 114/57 (!) 112/50  Pulse: 85  77 72  Resp: 18  (!) 21 18  Temp: 99.5 F (37.5 C) (!) 100.9 F (38.3 C) 98 F (36.7 C) 98 F (36.7 C)  TempSrc: Oral Oral Oral Oral  SpO2: 95%  93% 90%  Weight:      Height:        Intake/Output  Summary (Last 24 hours) at 11/15/2019 0817 Last data filed at 11/15/2019 0500 Gross per 24 hour  Intake 1398.1 ml  Output 90 ml  Net 1308.1 ml   Filed Weights   11/14/19 1445  Weight: 69.4 kg    Examination:  General exam: Appears calm and comfortable ,Not in distress,average built HEENT:PERRL,Oral mucosa moist, Ear/Nose normal on gross exam Respiratory system: Bilateral equal air entry, normal vesicular breath sounds, no wheezes or crackles  Cardiovascular system: S1 & S2 heard, RRR. No JVD, murmurs, rubs, gallops or clicks. No pedal edema. Gastrointestinal system: Abdomen is nondistended, soft and nontender. No organomegaly or masses felt. Normal bowel sounds heard. Central nervous system: Alert and oriented. No focal neurological deficits. Extremities: No edema, no clubbing ,no cyanosis Skin: No rashes, lesions or ulcers,no icterus ,no pallor MSK: Normal muscle bulk,tone ,power Psychiatry: Judgement and insight appear normal. Mood & affect appropriate.     Data Reviewed: I have personally reviewed following labs and imaging studies  CBC: Recent Labs  Lab 11/14/19 1536  WBC 21.2*  NEUTROABS 18.9*  HGB 12.5*  HCT 39.5  MCV 94.5  PLT 340   Basic Metabolic Panel: Recent Labs  Lab 11/14/19 1536 11/15/19 0130  NA 131* 133*  K 4.3 4.4  CL 100 105  CO2 21* 20*  GLUCOSE 132* 150*  BUN 29* 27*  CREATININE 1.41* 1.57*  CALCIUM 9.0 8.3*  MG 1.9 2.0   GFR: Estimated Creatinine Clearance: 38 mL/min (A) (by C-G formula based on SCr of 1.57 mg/dL (H)). Liver Function Tests: Recent Labs  Lab 11/14/19 1536 11/15/19 0130  AST 23 17  ALT 26 23  ALKPHOS 60 48  BILITOT 0.5 1.1  PROT 7.7 6.4*  ALBUMIN 4.4 3.5   Recent Labs  Lab 11/14/19 1536  LIPASE 255*   No results for input(s): AMMONIA in the last 168 hours. Coagulation Profile: Recent Labs  Lab 11/15/19 0130  INR 1.1   Cardiac Enzymes: No results for input(s): CKTOTAL, CKMB, CKMBINDEX, TROPONINI in the  last 168 hours. BNP (last 3 results) No results for input(s): PROBNP in the last 8760 hours. HbA1C: No results for input(s): HGBA1C in the last 72 hours. CBG: No results for input(s): GLUCAP in the last 168 hours. Lipid Profile: No results for input(s): CHOL, HDL, LDLCALC, TRIG, CHOLHDL, LDLDIRECT in the last 72 hours. Thyroid Function Tests: Recent Labs    11/14/19 1536  TSH 1.526   Anemia Panel: No results for input(s): VITAMINB12, FOLATE, FERRITIN, TIBC, IRON, RETICCTPCT in the last 72 hours. Sepsis Labs: Recent Labs  Lab 11/14/19 1536 11/14/19 1750 11/14/19 2326 11/15/19 0130  LATICACIDVEN 1.6 2.0* 0.9 0.7    Recent Results (from the past 240 hour(s))  SARS Coronavirus 2 by RT PCR (hospital order, performed in [facility] hospital lab) Nasopharyngeal Nasopharyngeal Swab     Status: None   Collection Time: 11/14/19  3:36 PM   Specimen: Nasopharyngeal Swab  Result Value Ref Range Status   SARS Coronavirus 2 NEGATIVE NEGATIVE Final    Comment: (NOTE) SARS-CoV-2 target nucleic acids are NOT DETECTED.  The SARS-CoV-2 RNA is generally detectable in upper and lower respiratory specimens during the acute phase of infection. The lowest concentration of SARS-CoV-2 viral copies this assay can detect is 250 copies / mL. A negative result does not preclude SARS-CoV-2 infection and should not be used as the sole basis for treatment or other patient management decisions.  A negative result may occur with improper specimen collection / handling, submission of specimen other than nasopharyngeal swab, presence of viral mutation(s) within the areas targeted by this assay, and inadequate number of viral copies (<250 copies / mL). A negative result must be combined with clinical observations, patient history, and epidemiological information.  Fact Sheet for Patients:   BoilerBrush.com.cy  Fact Sheet for Healthcare  Providers: https://pope.com/  This test is not yet approved or  cleared by the [place] FDA and has been authorized for detection and/or diagnosis of SARS-CoV-2 by FDA under an Emergency Use Authorization (EUA).  This EUA will remain in effect (meaning this test can be used) for the duration of the COVID-19 declaration under Section 564(b)(1) of the Act, 21 U.S.C. section 360bbb-3(b)(1), unless the authorization is terminated or revoked sooner.  Performed at [facility], [address] [place], [place] [address]          Radiology Studies: CT Head Wo Contrast  Result Date: 11/14/2019 CLINICAL DATA:  Head trauma, moderate/severe, hit head. Additional provided: Patient treated for UTI for 1 week, weakness today, diarrhea, fell backwards due to syncope. EXAM: CT HEAD WITHOUT CONTRAST TECHNIQUE: Contiguous axial images were obtained from the base of the skull through the vertex without intravenous contrast. COMPARISON:  Brain MRI 01/16/2019 FINDINGS: Brain: Stable, moderate generalized parenchymal atrophy Redemonstrated chronic cortical/subcortical infarct within the right frontal lobe. Stable background mild multifocal hypoattenuation within the cerebral white matter which is nonspecific, but consistent with chronic small vessel ischemic disease. There is no acute intracranial hemorrhage. No demarcated cortical infarct. No extra-axial fluid collection. No evidence of intracranial mass. No midline shift. Vascular: No hyperdense vessel. Skull: Normal. Negative for fracture or focal lesion. Sinuses/Orbits: Visualized orbits show no acute finding. No significant paranasal sinus disease or mastoid effusion at the imaged levels. Other: Left parietal scalp hematoma. IMPRESSION: No evidence of acute intracranial abnormality. Left parietal scalp hematoma. Redemonstrated chronic cortical/subcortical right frontal lobe infarct. Stable background moderate generalized  parenchymal atrophy and mild chronic small vessel ischemic disease. Electronically Signed   By: Jackey Loge DO   On: 11/14/2019 16:37   CT Chest Wo Contrast  Result Date: 11/14/2019 CLINICAL DATA:  Leukocytosis, nausea, vomiting, pancreatitis EXAM: CT CHEST WITHOUT CONTRAST AND CT ABDOMEN AND PELVIS WITH CONTRAST TECHNIQUE: Multidetector CT imaging of the chest, abdomen and pelvis was performed. Initial CT imaging of the chest was performed without contrast administration. Subsequently, CT imaging of the abdomen and pelvis was performed utilizing 80 cc of Omnipaque 300 intravenous iodinated contrast. COMPARISON:  12/18/2013 FINDINGS: CT CHEST FINDINGS Cardiovascular: Extensive multi-vessel coronary artery calcification. Global cardiac size within normal limits. No pericardial effusion. Central pulmonary arteries are enlarged suggesting changes of pulmonary arterial hypertension. Mild atherosclerotic calcification is seen within the thoracic aorta without evidence of aneurysm. Mediastinum/Nodes: No enlarged mediastinal, hilar, or axillary lymph nodes. Thyroid gland, trachea, and esophagus demonstrate no significant findings. Lungs/Pleura: Moderate centrilobular emphysema.  Mild right basilar atelectasis. No focal pulmonary nodule or infiltrate. No pneumothorax or pleural effusion. Central airways are widely pa tiny cortical cyst within the upper pole of the left kidney. No hydronephrosis. No intrarenal or ureteral calculi. Gas within the bladder lumen may relate to catheterization, tent. Musculoskeletal: No chest wall mass or suspicious bone lesions identified. CT ABDOMEN PELVIS FINDINGS Hepatobiliary: Liver unremarkable. Gallbladder unremarkable. No intra or extrahepatic biliary ductal dilation. Pancreas: The pancreas is unremarkable. Specifically, there is normal pancreatic parenchymal enhancement. No pancreatic ductal dilation. No pancreatic calcifications are identified. No peripancreatic inflammatory  stranding or peripancreatic fluid collections are identified. Spleen: Capsular calcifications involving the lateral splenic capsule may relate to remote trauma. The spleen is otherwise unremarkable. Adrenals/Urinary Tract: Adrenal glands are unremarkable. Kidneys are normal in size and position. Simple cortical cyst noted within the upper pole of the left kidney. No hydronephrosis. No intrarenal or ureteral calculi. There is moderate gas noted within the bladder lumen. Additionally, there is focal thickening of the bladder wall and intramural gas identified, best noted on axial image # 66/2 and sagittal image # 67/6 suspicious for a colovesicular fistula, possibly result of remote diverticulitis. Stomach/Bowel: There is severe sigmoid diverticulosis without superimposed inflammatory change. Scattered diverticular are seen within the descending colon. Appendectomy has been performed. Stomach and small bowel are unremarkable. No free intraperitoneal gas or fluid. Vascular/Lymphatic: Endovascular repair of an infrarenal abdominal aortic aneurysm utilizing a bifurcated stent graft has been performed there is progressive aneurysmal dilation of the juxtarenal abdominal aorta, measuring 3.6 x 3.3 cm just above the proximal landing zone of the stent graft, previously measuring 3.0 x 2.8 cm. This is best seen on axial image # 26/2 and sagittal image # 61/6. The infrarenal abdominal aortic aneurysm has decreased in maximal caliber measuring 3.7 cm at axial image # 34/2, previously measuring 4.3 cm. Moderate plaque within the lower extremity arterial inflow bilaterally. No pathologic adenopathy within the abdomen and pelvis. Reproductive: Mild prostatic enlargement. Seminal vesicles are unremarkable. Other: Rectum unremarkable. Prominent superficial venous varicosity identified within the anterior visualized right thigh. Musculoskeletal: No acute bone abnormality. IMPRESSION: 1. There is moderate gas within the bladder lumen,  with focal thickening of the bladder wall and intramural gas identified. Findings are suspicious for a colovesicular fistula, possibly result of remote diverticulitis. Correlation with urinalysis is recommended. 2. Endovascular repair of an infrarenal abdominal aortic aneurysm utilizing a bifurcated stent graft. There is progressive aneurysmal dilation of the juxtarenal abdominal aorta, measuring 3.6 x 3.3 cm, previously measuring 3.0 x 2.8 cm. The infrarenal abdominal aortic aneurysm has decreased in maximal caliber measuring 3.7 cm, previously measuring 4.3 cm. 3. Severe sigmoid diverticulosis without superimposed inflammatory change. 4. Moderate centrilobular emphysema. 5. Enlarged central pulmonary arteries suggesting changes of pulmonary arterial hypertension. 6. No CT evidence of acute or chronic pancreatitis. Aortic Atherosclerosis (ICD10-I70.0) and Emphysema (ICD10-J43.9). Electronically Signed   By: Helyn NumbersAshesh  Parikh MD   On: 11/14/2019 18:08   CT ABDOMEN PELVIS W CONTRAST  Result Date: 11/14/2019 CLINICAL DATA:  Leukocytosis, nausea, vomiting, pancreatitis EXAM: CT CHEST WITHOUT CONTRAST AND CT ABDOMEN AND PELVIS WITH CONTRAST TECHNIQUE: Multidetector CT imaging of the chest, abdomen and pelvis was performed. Initial CT imaging of the chest was performed without contrast administration. Subsequently, CT imaging of the abdomen and pelvis was performed utilizing 80 cc of Omnipaque 300 intravenous iodinated contrast. COMPARISON:  12/18/2013 FINDINGS: CT CHEST FINDINGS Cardiovascular: Extensive multi-vessel coronary artery calcification. Global cardiac size within normal limits. No pericardial effusion. Central  pulmonary arteries are enlarged suggesting changes of pulmonary arterial hypertension. Mild atherosclerotic calcification is seen within the thoracic aorta without evidence of aneurysm. Mediastinum/Nodes: No enlarged mediastinal, hilar, or axillary lymph nodes. Thyroid gland, trachea, and esophagus  demonstrate no significant findings. Lungs/Pleura: Moderate centrilobular emphysema. Mild right basilar atelectasis. No focal pulmonary nodule or infiltrate. No pneumothorax or pleural effusion. Central airways are widely pa tiny cortical cyst within the upper pole of the left kidney. No hydronephrosis. No intrarenal or ureteral calculi. Gas within the bladder lumen may relate to catheterization, tent. Musculoskeletal: No chest wall mass or suspicious bone lesions identified. CT ABDOMEN PELVIS FINDINGS Hepatobiliary: Liver unremarkable. Gallbladder unremarkable. No intra or extrahepatic biliary ductal dilation. Pancreas: The pancreas is unremarkable. Specifically, there is normal pancreatic parenchymal enhancement. No pancreatic ductal dilation. No pancreatic calcifications are identified. No peripancreatic inflammatory stranding or peripancreatic fluid collections are identified. Spleen: Capsular calcifications involving the lateral splenic capsule may relate to remote trauma. The spleen is otherwise unremarkable. Adrenals/Urinary Tract: Adrenal glands are unremarkable. Kidneys are normal in size and position. Simple cortical cyst noted within the upper pole of the left kidney. No hydronephrosis. No intrarenal or ureteral calculi. There is moderate gas noted within the bladder lumen. Additionally, there is focal thickening of the bladder wall and intramural gas identified, best noted on axial image # 66/2 and sagittal image # 67/6 suspicious for a colovesicular fistula, possibly result of remote diverticulitis. Stomach/Bowel: There is severe sigmoid diverticulosis without superimposed inflammatory change. Scattered diverticular are seen within the descending colon. Appendectomy has been performed. Stomach and small bowel are unremarkable. No free intraperitoneal gas or fluid. Vascular/Lymphatic: Endovascular repair of an infrarenal abdominal aortic aneurysm utilizing a bifurcated stent graft has been performed  there is progressive aneurysmal dilation of the juxtarenal abdominal aorta, measuring 3.6 x 3.3 cm just above the proximal landing zone of the stent graft, previously measuring 3.0 x 2.8 cm. This is best seen on axial image # 26/2 and sagittal image # 61/6. The infrarenal abdominal aortic aneurysm has decreased in maximal caliber measuring 3.7 cm at axial image # 34/2, previously measuring 4.3 cm. Moderate plaque within the lower extremity arterial inflow bilaterally. No pathologic adenopathy within the abdomen and pelvis. Reproductive: Mild prostatic enlargement. Seminal vesicles are unremarkable. Other: Rectum unremarkable. Prominent superficial venous varicosity identified within the anterior visualized right thigh. Musculoskeletal: No acute bone abnormality. IMPRESSION: 1. There is moderate gas within the bladder lumen, with focal thickening of the bladder wall and intramural gas identified. Findings are suspicious for a colovesicular fistula, possibly result of remote diverticulitis. Correlation with urinalysis is recommended. 2. Endovascular repair of an infrarenal abdominal aortic aneurysm utilizing a bifurcated stent graft. There is progressive aneurysmal dilation of the juxtarenal abdominal aorta, measuring 3.6 x 3.3 cm, previously measuring 3.0 x 2.8 cm. The infrarenal abdominal aortic aneurysm has decreased in maximal caliber measuring 3.7 cm, previously measuring 4.3 cm. 3. Severe sigmoid diverticulosis without superimposed inflammatory change. 4. Moderate centrilobular emphysema. 5. Enlarged central pulmonary arteries suggesting changes of pulmonary arterial hypertension. 6. No CT evidence of acute or chronic pancreatitis. Aortic Atherosclerosis (ICD10-I70.0) and Emphysema (ICD10-J43.9). Electronically Signed   By: Helyn Numbers MD   On: 11/14/2019 18:08   DG Chest Portable 1 View  Result Date: 11/14/2019 CLINICAL DATA:  Syncope, cough, fatigue EXAM: PORTABLE CHEST 1 VIEW COMPARISON:  10/29/2016  FINDINGS: The lungs are symmetrically hyperinflated in keeping with changes of underlying COPD. No superimposed focal pulmonary infiltrate. Linear lucencies at the right apex  in along the right lateral mid lung zone likely represent skin folds, however, a small apical pneumothorax is difficult to exclude on this examination. No pneumothorax on the left. No pleural effusion. Cardiac size within normal limits. Pulmonary vascularity normal. No acute bone abnormality. IMPRESSION: 1. Findings of COPD. 2. Linear lucencies at the right apex and along the right lateral mid lung zone likely represent skin folds, however, a small apical pneumothorax is difficult to exclude on this examination. A standard two view chest radiograph in the expiratory phase or a left lateral decubitus chest radiograph would be helpful in excluding a a pneumothorax. Electronically Signed   By: Helyn Numbers MD   On: 11/14/2019 16:34        Scheduled Meds: . donepezil  10 mg Oral Daily  . folic acid  1 mg Oral Daily  . heparin  5,000 Units Subcutaneous Q8H  . LORazepam  0-4 mg Intravenous Q6H   Followed by  . [START ON 11/17/2019] LORazepam  0-4 mg Intravenous Q12H  . multivitamin with minerals  1 tablet Oral Daily  . thiamine  100 mg Oral Daily   Or  . thiamine  100 mg Intravenous Daily   Continuous Infusions: . sodium chloride 75 mL/hr at 11/15/19 0500  . piperacillin-tazobactam (ZOSYN)  IV       LOS: 1 day    Time spent: 35 mins,More than 50% of that time was spent in counseling and/or coordination of care.      Burnadette Pop, MD [facility] P8/03/2020, 8:17 AM

## 2019-11-15 NOTE — Consult Note (Addendum)
[facility] Consult Note  [name] Juarez [birth year]  [id number].    Requesting MD: Joesph July Chief Complaint: Syncope Reason for Consult: Colovesicular fistula  HPI:  Patient is a 74 year old male with history of mild dementia, abdominal aortic aneurysm with endovascular graft stent repair, CKD stage III, who presented to the ED with a history of syncope while eating lunch.  He told his wife he felt hot, got up and left the room and passed out hitting his head.  Work-up in the ED shows no acute pathology of the head.  He has no prior history of syncope.  Patient reports burning with the urge to urinate for 2 months, also passing air when voiding.  He has had problems with diarrhea and constipation; His wife thinks this has been ongoing for at least 6 weeks.  Over the past 2 years he has lost 20 pounds or more.  She says he is lost more weight recently, but she cannot quantify.  Work-up in the ED: He was initially afebrile but he has had one low-grade fever, I 100.9 last evening.  He is not tachycardic.  Blood pressure is stable.  Admission labs shows a sodium of 131, CO2 21, glucose of 132, BUN of 29, creatinine 1.41 >>1.57, lipase of 255, troponin was 4>> 4, lactate was 1.6.  WBC 21.2, hemoglobin 12.5, hematocrit 39, platelets 340,000.  No CBC this AM.  INR 1.1 urinalysis shows 21-50 WBC/HPF.  Urine culture is pending.  Abdominal ultrasound yesterday showed fusiform aneurysmal dilatation of distal abdominal aorta maximum 3.2 cm diameter.  CT of the head with contrast: No evidence of acute intracranial abnormality, left parietal scalp hematoma, redemonstrated chronic cortical/subcortical right frontal lobe infarct.  Stable generalized parenchymal atrophy and mild chronic small vessel ischemic changes. CXR shows COPD, questionable apical pneumothorax.   CT of the chest, abdomen and pelvis with contrast shows: Moderate gas within the bladder lumen with focal thickening of the bladder wall and  intramural gas identified.  Findings suspicious for colovesicular fistula possibly the secondary to remote diverticulitis.  Endovascular repair of infrarenal abdominal aortic aneurysm using a bifurcated stent graft.  Progressive aneurysmal dilatation in the juxtarenal abdominal aorta measuring 3.6 x 3.3 cm.  Moderate central lobar emphysema and large pulmonary arteries suggesting pulmonary arterial hypertension.  No evidence of acute or chronic pancreatitis. ROS: Review of Systems  Constitutional: Positive for weight loss (20 pounds over the last 2 years).  HENT: Negative.   Eyes: Negative.   Respiratory: Positive for cough. Negative for hemoptysis, sputum production, shortness of breath and wheezing.        He says he can walk a mile on the treadmill.  They are planning a trip to [place] in September, and have been working to build up strength for this trip.  Cardiovascular: Negative.   Gastrointestinal: Positive for constipation, diarrhea and heartburn. Negative for abdominal pain, blood in stool, nausea and vomiting.       Dr. Ewing Schlein is his gastroenterologist and did his last colonoscopy.  They are not sure of the date.  Sounds like he did have diverticulosis without diverticulitis.  Genitourinary: Positive for dysuria.       Burning sensation with voiding x2 months, is also noted bubbles of air when he voids.  Skin: Negative.   Neurological: Negative.        Syncope this admission.  This is the first time this is ever occurred.  Endo/Heme/Allergies: Negative.   Psychiatric/Behavioral: Negative.     Family History  Problem Relation Age of Onset  . Tuberculosis Father   . Tuberculosis Sister     Past Medical History:  Diagnosis Date  . Arthritis   . Colon polyp 10/2006  . Erectile dysfunction   . Hyperlipidemia   . Hypertension   . Tuberculosis    age 89  . Varicose veins     Past Surgical History:  Procedure Laterality Date  . ABDOMINAL AORTIC ENDOVASCULAR STENT GRAFT   11/03/2011  . APPENDECTOMY    . INGUINAL HERNIA REPAIR Right   . KNEE SURGERY Right   . LAPAROSCOPIC APPENDECTOMY N/A 08/03/2013   Procedure: APPENDECTOMY LAPAROSCOPIC;  Surgeon: Velora Hecklerodd M Gerkin, MD;  Location: [facility];  Service: General;  Laterality: N/A;    Social History:  reports that he quit smoking about 8 years ago. His smoking use included cigarettes. He quit after 45.00 years of use. He has never used smokeless tobacco. He reports current alcohol use of about 25.0 standard drinks of alcohol per week. He reports that he does not use drugs.  Allergies: No Known Allergies  Medications Prior to Admission  Medication Sig Dispense Refill  . acetaminophen (TYLENOL) 325 MG tablet Take 2 tablets (650 mg total) by mouth every 4 (four) hours as needed for mild pain, fever or headache (Do not take more than 4000 mg of Tylenol (acetaminophen) daily.  This is in your prescription pain medicine.).    Marland Kitchen. aspirin EC 81 MG tablet Take 81 mg by mouth daily.    . diphenoxylate-atropine (LOMOTIL) 2.5-0.025 MG tablet Take 1 tablet by mouth 4 (four) times daily as needed for diarrhea or loose stools.    . donepezil (ARICEPT) 10 MG tablet Take 10 mg by mouth daily.    Marland Kitchen. loperamide (IMODIUM A-D) 2 MG tablet Take 1 mg by mouth as needed for diarrhea or loose stools.    . nitrofurantoin, macrocrystal-monohydrate, (MACROBID) 100 MG capsule Take 100 mg by mouth 2 (two) times daily.      Blood pressure (!) 112/50, pulse 72, temperature 98 F (36.7 C), temperature source Oral, resp. rate 18, height 5\' 7"  (1.702 m), weight 69.4 kg, SpO2 90 %. Physical Exam: General: pleasant, elderly, WD, WN white male who is laying in bed in NAD HEENT: head is normocephalic, atraumatic.  Sclera are noninjected.  PERRL.  Ears and nose without any masses or lesions.  Mouth is pink and moist Heart: regular, rate, and rhythm.  Normal s1,s2. No obvious murmurs, gallops, or rubs noted.  Palpable radial and pedal pulses bilaterally Lungs:  CTAB, no wheezes, rhonchi, or rales noted.  Respiratory effort nonlabored Abd: soft, NT, ND, +BS, no masses, hernias, or organomegaly MS: all 4 extremities are symmetrical with no cyanosis, clubbing, or edema. Skin: warm and dry with no masses, lesions, or rashes Neuro: Cranial nerves 2-12 grossly intact, sensation is normal throughout Psych: A&Ox3 with an appropriate affect.   Results for orders placed or performed during the hospital encounter of 11/14/19 (from the past 48 hour(s))  CBC with Differential     Status: Abnormal   Collection Time: 11/14/19  3:36 PM  Result Value Ref Range   WBC 21.2 (H) 4.0 - 10.5 K/uL   RBC 4.18 (L) 4.22 - 5.81 MIL/uL   Hemoglobin 12.5 (L) 13.0 - 17.0 g/dL   HCT 86.539.5 39 - 52 %   MCV 94.5 80.0 - 100.0 fL   MCH 29.9 26.0 - 34.0 pg   MCHC 31.6 30.0 - 36.0 g/dL   RDW 78.413.3 69.611.5 -  15.5 %   Platelets 340 150 - 400 K/uL   nRBC 0.0 0.0 - 0.2 %   Neutrophils Relative % 89 %   Neutro Abs 18.9 (H) 1.7 - 7.7 K/uL   Lymphocytes Relative 4 %   Lymphs Abs 0.9 0.7 - 4.0 K/uL   Monocytes Relative 6 %   Monocytes Absolute 1.2 (H) 0 - 1 K/uL   Eosinophils Relative 0 %   Eosinophils Absolute 0.0 0 - 0 K/uL   Basophils Relative 0 %   Basophils Absolute 0.1 0 - 0 K/uL   Immature Granulocytes 1 %   Abs Immature Granulocytes 0.11 (H) 0.00 - 0.07 K/uL    Comment: Performed at [facility], [address] [place], [place] [address]  Comprehensive metabolic panel     Status: Abnormal   Collection Time: 11/14/19  3:36 PM  Result Value Ref Range   Sodium 131 (L) 135 - 145 mmol/L   Potassium 4.3 3.5 - 5.1 mmol/L   Chloride 100 98 - 111 mmol/L   CO2 21 (L) 22 - 32 mmol/L   Glucose, Bld 132 (H) 70 - 99 mg/dL    Comment: Glucose reference range applies only to samples taken after fasting for at least 8 hours.   BUN 29 (H) 8 - 23 mg/dL   Creatinine, Ser 6.04 (H) 0.61 - 1.24 mg/dL   Calcium 9.0 8.9 - 54.0 mg/dL   Total Protein 7.7 6.5 - 8.1 g/dL   Albumin 4.4  3.5 - 5.0 g/dL   AST 23 15 - 41 U/L   ALT 26 0 - 44 U/L   Alkaline Phosphatase 60 38 - 126 U/L   Total Bilirubin 0.5 0.3 - 1.2 mg/dL   GFR calc non Af Amer 48 (L) >60 mL/min   GFR calc Af Amer 56 (L) >60 mL/min   Anion gap 10 5 - 15    Comment: Performed at [facility], [address] [place], [place] [address]  Troponin I (High Sensitivity)     Status: None   Collection Time: 11/14/19  3:36 PM  Result Value Ref Range   Troponin I (High Sensitivity) 4 <18 ng/L    Comment: (NOTE) Elevated high sensitivity troponin I (hsTnI) values and significant  changes across serial measurements may suggest ACS but many other  chronic and acute conditions are known to elevate hsTnI results.  Refer to the "Links" section for chest pain algorithms and additional  guidance. Performed at [facility], [address] [place], [place] [address]   Lactic acid, plasma     Status: None   Collection Time: 11/14/19  3:36 PM  Result Value Ref Range   Lactic Acid, Venous 1.6 0.5 - 1.9 mmol/L    Comment: Performed at [facility], [address] [place], [place] [address]  Urinalysis, Routine w reflex microscopic Urine, Clean Catch     Status: Abnormal   Collection Time: 11/14/19  3:36 PM  Result Value Ref Range   Color, Urine YELLOW YELLOW   APPearance CLEAR CLEAR   Specific Gravity, Urine 1.025 1.005 - 1.030   pH 5.5 5.0 - 8.0   Glucose, UA NEGATIVE NEGATIVE mg/dL   Hgb urine dipstick TRACE (A) NEGATIVE   Bilirubin Urine NEGATIVE NEGATIVE   Ketones, ur NEGATIVE NEGATIVE mg/dL   Protein, ur 30 (A) NEGATIVE mg/dL   Nitrite NEGATIVE NEGATIVE   Leukocytes,Ua SMALL (A) NEGATIVE    Comment: Performed at  [facility], [address] [place], [place] [address]  Lipase, blood     Status: Abnormal   Collection Time: 11/14/19  3:36 PM  Result Value Ref Range   Lipase 255 (H) 11 - 51 U/L    Comment: Performed at [facility], [address]  [place], [place] [address]  TSH     Status: None   Collection Time: 11/14/19  3:36 PM  Result Value Ref Range   TSH 1.526 0.350 - 4.500 uIU/mL    Comment: Performed by a 3rd Generation assay with a functional sensitivity of <=0.01 uIU/mL. Performed at [facility], [address], [place], [place] [address]   SARS Coronavirus 2 by RT PCR (hospital order, performed in [facility] hospital lab) Nasopharyngeal Nasopharyngeal Swab     Status: None   Collection Time: 11/14/19  3:36 PM   Specimen: Nasopharyngeal Swab  Result Value Ref Range   SARS Coronavirus 2 NEGATIVE NEGATIVE    Comment: (NOTE) SARS-CoV-2 target nucleic acids are NOT DETECTED.  The SARS-CoV-2 RNA is generally detectable in upper and lower respiratory specimens during the acute phase of infection. The lowest concentration of SARS-CoV-2 viral copies this assay can detect is 250 copies / mL. A negative result does not preclude SARS-CoV-2 infection and should not be used as the sole basis for treatment or other patient management decisions.  A negative result may occur with improper specimen collection / handling, submission of specimen other than nasopharyngeal swab, presence of viral mutation(s) within the areas targeted by this assay, and inadequate number of viral copies (<250 copies / mL). A negative result must be combined with clinical observations, patient history, and epidemiological information.  Fact Sheet for Patients:   BoilerBrush.com.cy  Fact Sheet for Healthcare Providers: https://pope.com/  This test is not yet approved or  cleared by the [place] FDA and has been authorized for detection and/or diagnosis of SARS-CoV-2 by FDA under an Emergency Use Authorization (EUA).  This EUA will remain in effect (meaning this test can be used) for the duration of the COVID-19 declaration under Section 564(b)(1) of the Act, 21 U.S.C. section 360bbb-3(b)(1),  unless the authorization is terminated or revoked sooner.  Performed at [facility], [address] [place], [place] [address]   Magnesium     Status: None   Collection Time: 11/14/19  3:36 PM  Result Value Ref Range   Magnesium 1.9 1.7 - 2.4 mg/dL    Comment: Performed at [facility], [address] [place], [place] [address]  Urinalysis, Microscopic (reflex)     Status: Abnormal   Collection Time: 11/14/19  3:36 PM  Result Value Ref Range   RBC / HPF 6-10 0 - 5 RBC/hpf   WBC, UA 21-50 0 - 5 WBC/hpf   Bacteria, UA FEW (A) NONE SEEN   Squamous Epithelial / LPF 0-5 0 - 5   WBC Clumps PRESENT     Comment: Performed at [facility], [address] [place], [place] [address]  Lactic acid, plasma     Status: Abnormal   Collection Time: 11/14/19  5:50 PM  Result Value Ref Range   Lactic Acid, Venous 2.0 (HH) 0.5 - 1.9 mmol/L    Comment: CRITICAL RESULT CALLED TO, READ BACK BY AND VERIFIED WITH: Cordie Grice RN  11/14/2019 OLSONM Performed at [facility], [address], [place], [place] [address]  Troponin I (High Sensitivity)     Status: None   Collection Time: 11/14/19  5:50 PM  Result Value Ref Range   Troponin I (High Sensitivity) 4 <18 ng/L    Comment: (NOTE) Elevated high sensitivity troponin I (hsTnI) values and significant  changes across serial measurements may suggest ACS but many other  chronic and acute conditions are known to elevate hsTnI results.  Refer to the "Links" section for chest pain algorithms and additional  guidance. Performed at [facility], [address] [place], [place] [address]   Lactic acid, plasma     Status: None   Collection Time: 11/14/19 11:26 PM  Result Value Ref Range   Lactic Acid, Venous 0.9 0.5 - 1.9 mmol/L    Comment: Performed at [facility], [address] [place], [place] [address]  Lactic acid, plasma     Status: None   Collection Time:  11/15/19  1:30 AM  Result Value Ref Range   Lactic Acid, Venous 0.7 0.5 - 1.9 mmol/L    Comment: Performed at [facility], [address], [place], [place] [address]  Protime-INR     Status: None   Collection Time: 11/15/19  1:30 AM  Result Value Ref Range   Prothrombin Time 14.1 11.4 - 15.2 seconds   INR 1.1 0.8 - 1.2    Comment: (NOTE) INR goal varies based on device and disease states. Performed at [facility], [address] [place], [place] [address]   Comprehensive metabolic panel     Status: Abnormal   Collection Time: 11/15/19  1:30 AM  Result Value Ref Range   Sodium 133 (L) 135 - 145 mmol/L   Potassium 4.4 3.5 - 5.1 mmol/L   Chloride 105 98 - 111 mmol/L   CO2 20 (L) 22 - 32 mmol/L   Glucose, Bld 150 (H) 70 - 99 mg/dL    Comment: Glucose reference range applies only to samples taken after fasting for at least 8 hours.   BUN 27 (H) 8 - 23 mg/dL   Creatinine, Ser 2.29 (H) 0.61 - 1.24 mg/dL   Calcium 8.3 (L) 8.9 - 10.3 mg/dL   Total Protein 6.4 (L) 6.5 - 8.1 g/dL   Albumin 3.5 3.5 - 5.0 g/dL   AST 17 15 - 41 U/L   ALT 23 0 - 44 U/L   Alkaline Phosphatase 48 38 - 126 U/L   Total Bilirubin 1.1 0.3 - 1.2 mg/dL   GFR calc non Af Amer 42 (L) >60 mL/min   GFR calc Af Amer 49 (L) >60 mL/min   Anion gap 8 5 - 15    Comment: Performed at [facility], [address] [place], [place] [address]  Magnesium     Status: None   Collection Time: 11/15/19  1:30 AM  Result Value Ref Range   Magnesium 2.0 1.7 - 2.4 mg/dL    Comment: Performed at [facility], [address], [place], [place] [address]   CT Head Wo Contrast  Result Date: 11/14/2019 CLINICAL DATA:  Head trauma, moderate/severe, hit head. Additional provided: Patient treated for UTI for 1 week, weakness today, diarrhea, fell backwards due to syncope. EXAM: CT HEAD WITHOUT CONTRAST TECHNIQUE: Contiguous axial images were obtained from the base  of the skull through the vertex without intravenous contrast. COMPARISON:  Brain MRI 01/16/2019 FINDINGS: Brain: Stable, moderate generalized parenchymal atrophy Redemonstrated chronic cortical/subcortical infarct within the right frontal lobe.  Stable background mild multifocal hypoattenuation within the cerebral white matter which is nonspecific, but consistent with chronic small vessel ischemic disease. There is no acute intracranial hemorrhage. No demarcated cortical infarct. No extra-axial fluid collection. No evidence of intracranial mass. No midline shift. Vascular: No hyperdense vessel. Skull: Normal. Negative for fracture or focal lesion. Sinuses/Orbits: Visualized orbits show no acute finding. No significant paranasal sinus disease or mastoid effusion at the imaged levels. Other: Left parietal scalp hematoma. IMPRESSION: No evidence of acute intracranial abnormality. Left parietal scalp hematoma. Redemonstrated chronic cortical/subcortical right frontal lobe infarct. Stable background moderate generalized parenchymal atrophy and mild chronic small vessel ischemic disease. Electronically Signed   By: Jackey Loge DO   On: 11/14/2019 16:37   CT Chest Wo Contrast  Result Date: 11/14/2019 CLINICAL DATA:  Leukocytosis, nausea, vomiting, pancreatitis EXAM: CT CHEST WITHOUT CONTRAST AND CT ABDOMEN AND PELVIS WITH CONTRAST TECHNIQUE: Multidetector CT imaging of the chest, abdomen and pelvis was performed. Initial CT imaging of the chest was performed without contrast administration. Subsequently, CT imaging of the abdomen and pelvis was performed utilizing 80 cc of Omnipaque 300 intravenous iodinated contrast. COMPARISON:  12/18/2013 FINDINGS: CT CHEST FINDINGS Cardiovascular: Extensive multi-vessel coronary artery calcification. Global cardiac size within normal limits. No pericardial effusion. Central pulmonary arteries are enlarged suggesting changes of pulmonary arterial hypertension. Mild atherosclerotic  calcification is seen within the thoracic aorta without evidence of aneurysm. Mediastinum/Nodes: No enlarged mediastinal, hilar, or axillary lymph nodes. Thyroid gland, trachea, and esophagus demonstrate no significant findings. Lungs/Pleura: Moderate centrilobular emphysema. Mild right basilar atelectasis. No focal pulmonary nodule or infiltrate. No pneumothorax or pleural effusion. Central airways are widely pa tiny cortical cyst within the upper pole of the left kidney. No hydronephrosis. No intrarenal or ureteral calculi. Gas within the bladder lumen may relate to catheterization, tent. Musculoskeletal: No chest wall mass or suspicious bone lesions identified. CT ABDOMEN PELVIS FINDINGS Hepatobiliary: Liver unremarkable. Gallbladder unremarkable. No intra or extrahepatic biliary ductal dilation. Pancreas: The pancreas is unremarkable. Specifically, there is normal pancreatic parenchymal enhancement. No pancreatic ductal dilation. No pancreatic calcifications are identified. No peripancreatic inflammatory stranding or peripancreatic fluid collections are identified. Spleen: Capsular calcifications involving the lateral splenic capsule may relate to remote trauma. The spleen is otherwise unremarkable. Adrenals/Urinary Tract: Adrenal glands are unremarkable. Kidneys are normal in size and position. Simple cortical cyst noted within the upper pole of the left kidney. No hydronephrosis. No intrarenal or ureteral calculi. There is moderate gas noted within the bladder lumen. Additionally, there is focal thickening of the bladder wall and intramural gas identified, best noted on axial image # 66/2 and sagittal image # 67/6 suspicious for a colovesicular fistula, possibly result of remote diverticulitis. Stomach/Bowel: There is severe sigmoid diverticulosis without superimposed inflammatory change. Scattered diverticular are seen within the descending colon. Appendectomy has been performed. Stomach and small bowel are  unremarkable. No free intraperitoneal gas or fluid. Vascular/Lymphatic: Endovascular repair of an infrarenal abdominal aortic aneurysm utilizing a bifurcated stent graft has been performed there is progressive aneurysmal dilation of the juxtarenal abdominal aorta, measuring 3.6 x 3.3 cm just above the proximal landing zone of the stent graft, previously measuring 3.0 x 2.8 cm. This is best seen on axial image # 26/2 and sagittal image # 61/6. The infrarenal abdominal aortic aneurysm has decreased in maximal caliber measuring 3.7 cm at axial image # 34/2, previously measuring 4.3 cm. Moderate plaque within the lower extremity arterial inflow bilaterally. No pathologic adenopathy within the abdomen and pelvis. Reproductive:  Mild prostatic enlargement. Seminal vesicles are unremarkable. Other: Rectum unremarkable. Prominent superficial venous varicosity identified within the anterior visualized right thigh. Musculoskeletal: No acute bone abnormality. IMPRESSION: 1. There is moderate gas within the bladder lumen, with focal thickening of the bladder wall and intramural gas identified. Findings are suspicious for a colovesicular fistula, possibly result of remote diverticulitis. Correlation with urinalysis is recommended. 2. Endovascular repair of an infrarenal abdominal aortic aneurysm utilizing a bifurcated stent graft. There is progressive aneurysmal dilation of the juxtarenal abdominal aorta, measuring 3.6 x 3.3 cm, previously measuring 3.0 x 2.8 cm. The infrarenal abdominal aortic aneurysm has decreased in maximal caliber measuring 3.7 cm, previously measuring 4.3 cm. 3. Severe sigmoid diverticulosis without superimposed inflammatory change. 4. Moderate centrilobular emphysema. 5. Enlarged central pulmonary arteries suggesting changes of pulmonary arterial hypertension. 6. No CT evidence of acute or chronic pancreatitis. Aortic Atherosclerosis (ICD10-I70.0) and Emphysema (ICD10-J43.9). Electronically Signed   By:  Helyn Numbers MD   On: 11/14/2019 18:08   CT ABDOMEN PELVIS W CONTRAST  Result Date: 11/14/2019 CLINICAL DATA:  Leukocytosis, nausea, vomiting, pancreatitis EXAM: CT CHEST WITHOUT CONTRAST AND CT ABDOMEN AND PELVIS WITH CONTRAST TECHNIQUE: Multidetector CT imaging of the chest, abdomen and pelvis was performed. Initial CT imaging of the chest was performed without contrast administration. Subsequently, CT imaging of the abdomen and pelvis was performed utilizing 80 cc of Omnipaque 300 intravenous iodinated contrast. COMPARISON:  12/18/2013 FINDINGS: CT CHEST FINDINGS Cardiovascular: Extensive multi-vessel coronary artery calcification. Global cardiac size within normal limits. No pericardial effusion. Central pulmonary arteries are enlarged suggesting changes of pulmonary arterial hypertension. Mild atherosclerotic calcification is seen within the thoracic aorta without evidence of aneurysm. Mediastinum/Nodes: No enlarged mediastinal, hilar, or axillary lymph nodes. Thyroid gland, trachea, and esophagus demonstrate no significant findings. Lungs/Pleura: Moderate centrilobular emphysema. Mild right basilar atelectasis. No focal pulmonary nodule or infiltrate. No pneumothorax or pleural effusion. Central airways are widely pa tiny cortical cyst within the upper pole of the left kidney. No hydronephrosis. No intrarenal or ureteral calculi. Gas within the bladder lumen may relate to catheterization, tent. Musculoskeletal: No chest wall mass or suspicious bone lesions identified. CT ABDOMEN PELVIS FINDINGS Hepatobiliary: Liver unremarkable. Gallbladder unremarkable. No intra or extrahepatic biliary ductal dilation. Pancreas: The pancreas is unremarkable. Specifically, there is normal pancreatic parenchymal enhancement. No pancreatic ductal dilation. No pancreatic calcifications are identified. No peripancreatic inflammatory stranding or peripancreatic fluid collections are identified. Spleen: Capsular calcifications  involving the lateral splenic capsule may relate to remote trauma. The spleen is otherwise unremarkable. Adrenals/Urinary Tract: Adrenal glands are unremarkable. Kidneys are normal in size and position. Simple cortical cyst noted within the upper pole of the left kidney. No hydronephrosis. No intrarenal or ureteral calculi. There is moderate gas noted within the bladder lumen. Additionally, there is focal thickening of the bladder wall and intramural gas identified, best noted on axial image # 66/2 and sagittal image # 67/6 suspicious for a colovesicular fistula, possibly result of remote diverticulitis. Stomach/Bowel: There is severe sigmoid diverticulosis without superimposed inflammatory change. Scattered diverticular are seen within the descending colon. Appendectomy has been performed. Stomach and small bowel are unremarkable. No free intraperitoneal gas or fluid. Vascular/Lymphatic: Endovascular repair of an infrarenal abdominal aortic aneurysm utilizing a bifurcated stent graft has been performed there is progressive aneurysmal dilation of the juxtarenal abdominal aorta, measuring 3.6 x 3.3 cm just above the proximal landing zone of the stent graft, previously measuring 3.0 x 2.8 cm. This is best seen on axial image # 26/2  and sagittal image # 61/6. The infrarenal abdominal aortic aneurysm has decreased in maximal caliber measuring 3.7 cm at axial image # 34/2, previously measuring 4.3 cm. Moderate plaque within the lower extremity arterial inflow bilaterally. No pathologic adenopathy within the abdomen and pelvis. Reproductive: Mild prostatic enlargement. Seminal vesicles are unremarkable. Other: Rectum unremarkable. Prominent superficial venous varicosity identified within the anterior visualized right thigh. Musculoskeletal: No acute bone abnormality. IMPRESSION: 1. There is moderate gas within the bladder lumen, with focal thickening of the bladder wall and intramural gas identified. Findings are  suspicious for a colovesicular fistula, possibly result of remote diverticulitis. Correlation with urinalysis is recommended. 2. Endovascular repair of an infrarenal abdominal aortic aneurysm utilizing a bifurcated stent graft. There is progressive aneurysmal dilation of the juxtarenal abdominal aorta, measuring 3.6 x 3.3 cm, previously measuring 3.0 x 2.8 cm. The infrarenal abdominal aortic aneurysm has decreased in maximal caliber measuring 3.7 cm, previously measuring 4.3 cm. 3. Severe sigmoid diverticulosis without superimposed inflammatory change. 4. Moderate centrilobular emphysema. 5. Enlarged central pulmonary arteries suggesting changes of pulmonary arterial hypertension. 6. No CT evidence of acute or chronic pancreatitis. Aortic Atherosclerosis (ICD10-I70.0) and Emphysema (ICD10-J43.9). Electronically Signed   By: Helyn Numbers MD   On: 11/14/2019 18:08   DG Chest Portable 1 View  Result Date: 11/14/2019 CLINICAL DATA:  Syncope, cough, fatigue EXAM: PORTABLE CHEST 1 VIEW COMPARISON:  10/29/2016 FINDINGS: The lungs are symmetrically hyperinflated in keeping with changes of underlying COPD. No superimposed focal pulmonary infiltrate. Linear lucencies at the right apex in along the right lateral mid lung zone likely represent skin folds, however, a small apical pneumothorax is difficult to exclude on this examination. No pneumothorax on the left. No pleural effusion. Cardiac size within normal limits. Pulmonary vascularity normal. No acute bone abnormality. IMPRESSION: 1. Findings of COPD. 2. Linear lucencies at the right apex and along the right lateral mid lung zone likely represent skin folds, however, a small apical pneumothorax is difficult to exclude on this examination. A standard two view chest radiograph in the expiratory phase or a left lateral decubitus chest radiograph would be helpful in excluding a a pneumothorax. Electronically Signed   By: Helyn Numbers MD   On: 11/14/2019 16:34   .  sodium chloride 75 mL/hr at 11/15/19 0500  . piperacillin-tazobactam (ZOSYN)  IV    1398 IV Urine 90 Emesis x2 BM x3 yesterday and x2 today.   Assessment/Plan Syncope of uncertain etiology Leukocytosis  - 21.2 >> Hx AAA with endovascular stent repair/progressive juxtarenal abdominal aortic dilatation Hx CVA Dementia Stage III kidney disease  -Creatinine 1.41>> 1.57 Hx hypertension Tobacco use x59 years/COPD   Colovesicular fistula UTI - culture pending  FEN: IV fluids/n.p.o. ID: Zosyn 8/11 >> day 2 DVT: Heparin Follow-up: Dr. Cassie Freer  Plan: Discussed with Dr.Adhikari, the patient and his wife.  He will need treatment for UTI.  He will most likely go home on antibiotics.  He needs a repeat colonoscopy, and then follow-up with Dr. Maisie Fus in our office.  They were planning a 30-day trip to [place] on September 30.  We will follow with you during his hospitalization.  Sherrie George [facility] Surgery 11/15/2019, 8:53 AM Please see Amion for pager number during day hours 7:00am-4:30pm

## 2019-11-15 NOTE — Progress Notes (Signed)
PT arrived via [facility] into Room1531, became nauseated shortly after transferring to our bed. He was also incontinent of stool and was escorted to the toilet to finish having a BM & to get cleaned up. Upon returning to bed he was exhausted. Pts wife Diane was called & informed of his arrival. [facility] has been paged for his arrival as well.

## 2019-11-15 NOTE — Progress Notes (Addendum)
Patient's hospital chart reviewed and his previous colonoscopy from March of 18 reviewed and case discussed with the surgical team and we are available if needed during the hospital stay otherwise agree with plans for antibiotics working up his syncope and outpatient colonoscopy and I am happy to coordinate with the surgeon if she would like it the day before his surgery so he would only have to drink 1 prep however if she would like it done first I am happy to proceed as an outpatient whenever convenient please let me know and call as needed as above and probably can begin clear liquids since no obvious urgent surgery needed at this time

## 2019-11-15 NOTE — Progress Notes (Signed)
  Echocardiogram 2D Echocardiogram has been performed.  [name] Juarez 11/15/2019, 9:06 AM

## 2019-11-15 NOTE — Progress Notes (Signed)
Pt & wife requesting to speak to Gen Surgeon (Will Marlyne Beards). Amion sent to Will, PA-C.

## 2019-11-16 LAB — CBC WITH DIFFERENTIAL/PLATELET
Abs Immature Granulocytes: 0.05 10*3/uL (ref 0.00–0.07)
Basophils Absolute: 0.1 10*3/uL (ref 0.0–0.1)
Basophils Relative: 1 %
Eosinophils Absolute: 0 10*3/uL (ref 0.0–0.5)
Eosinophils Relative: 0 %
HCT: 33.8 % — ABNORMAL LOW (ref 39.0–52.0)
Hemoglobin: 10.6 g/dL — ABNORMAL LOW (ref 13.0–17.0)
Immature Granulocytes: 0 %
Lymphocytes Relative: 10 %
Lymphs Abs: 1.2 10*3/uL (ref 0.7–4.0)
MCH: 29.9 pg (ref 26.0–34.0)
MCHC: 31.4 g/dL (ref 30.0–36.0)
MCV: 95.2 fL (ref 80.0–100.0)
Monocytes Absolute: 0.8 10*3/uL (ref 0.1–1.0)
Monocytes Relative: 7 %
Neutro Abs: 9.6 10*3/uL — ABNORMAL HIGH (ref 1.7–7.7)
Neutrophils Relative %: 82 %
Platelets: 253 10*3/uL (ref 150–400)
RBC: 3.55 MIL/uL — ABNORMAL LOW (ref 4.22–5.81)
RDW: 13.5 % (ref 11.5–15.5)
WBC: 11.7 10*3/uL — ABNORMAL HIGH (ref 4.0–10.5)
nRBC: 0 % (ref 0.0–0.2)

## 2019-11-16 LAB — BASIC METABOLIC PANEL
Anion gap: 8 (ref 5–15)
BUN: 23 mg/dL (ref 8–23)
CO2: 23 mmol/L (ref 22–32)
Calcium: 8.6 mg/dL — ABNORMAL LOW (ref 8.9–10.3)
Chloride: 106 mmol/L (ref 98–111)
Creatinine, Ser: 1.6 mg/dL — ABNORMAL HIGH (ref 0.61–1.24)
GFR calc Af Amer: 48 mL/min — ABNORMAL LOW (ref >60)
GFR calc non Af Amer: 42 mL/min — ABNORMAL LOW (ref >60)
Glucose, Bld: 119 mg/dL — ABNORMAL HIGH (ref 70–99)
Potassium: 3.9 mmol/L (ref 3.5–5.1)
Sodium: 137 mmol/L (ref 135–145)

## 2019-11-16 MED ORDER — LOPERAMIDE HCL 2 MG PO CAPS: 2.0000 mg | ORAL_CAPSULE | Freq: Four times a day (QID) | ORAL | Status: DC | PRN

## 2019-11-16 NOTE — Care Management Important Message (Signed)
Important Message  Patient Details IM Letter given to the Patient Name: [name] Juarez MRN: [id number] Date of Birth: 03-31-1946   Medicare Important Message Given:  Yes     Caren Macadam 11/16/2019, 10:46 AM

## 2019-11-16 NOTE — Progress Notes (Addendum)
    CC:  Subjective: Patient with ongoing diarrhea, no abdominal pain.  C. difficile antigen is positive, toxin is negative.  He is on a CIWA protocol, he reports drinking about 1/5 of bourbon per week.   Objective: Vital signs in last 24 hours: Temp:  [98.2 F (36.8 C)-98.8 F (37.1 C)] 98.8 F (37.1 C) (08/13 0435) Pulse Rate:  [62-80] 78 (08/13 0435) Resp:  [16-20] 20 (08/13 0435) BP: (111-133)/(53-67) 133/67 (08/13 0435) SpO2:  [90 %-95 %] 92 % (08/13 0435) Last BM Date: 11/15/19 255 IV 660 IV record 100 urine recorded BM x 7 recorded Afebrile vital signs are stable BMP shows creatinine up to 1.60 WBC 11.7 H/H 10.6/33.8 Platelets 253,000 C. difficile antigen is positive toxin is negative. Urine culture less than 10,000 colonies/ml  Intake/Output from previous day: 08/12 0701 - 08/13 0700 In: 915.6 [P.O.:255; I.V.:645.6; IV Piggyback:15] Out: 100 [Urine:100] Intake/Output this shift: No intake/output data recorded.  General appearance: alert, cooperative and no distress Resp: clear to auscultation bilaterally GI: soft, non-tender; bowel sounds normal; no masses,  no organomegaly  Lab Results:  Recent Labs    11/14/19 1536 11/16/19 0407  WBC 21.2* 11.7*  HGB 12.5* 10.6*  HCT 39.5 33.8*  PLT 340 253    BMET Recent Labs    11/15/19 0130 11/16/19 0407  NA 133* 137  K 4.4 3.9  CL 105 106  CO2 20* 23  GLUCOSE 150* 119*  BUN 27* 23  CREATININE 1.57* 1.60*  CALCIUM 8.3* 8.6*   PT/INR Recent Labs    11/15/19 0130  LABPROT 14.1  INR 1.1    Recent Labs  Lab 11/14/19 1536 11/15/19 0130  AST 23 17  ALT 26 23  ALKPHOS 60 48  BILITOT 0.5 1.1  PROT 7.7 6.4*  ALBUMIN 4.4 3.5     Lipase     Component Value Date/Time   LIPASE 53 (H) 11/15/2019 0130     Medications: . donepezil  10 mg Oral Daily  . folic acid  1 mg Oral Daily  . heparin  5,000 Units Subcutaneous Q8H  . LORazepam  0-4 mg Intravenous Q6H   Followed by  . [START ON  11/17/2019] LORazepam  0-4 mg Intravenous Q12H  . multivitamin with minerals  1 tablet Oral Daily  . thiamine  100 mg Oral Daily   Or  . thiamine  100 mg Intravenous Daily    Assessment/Plan Syncope of uncertain etiology Leukocytosis  - 21.2 >> 11.7 Hx AAA with endovascular stent repair/progressive juxtarenal abdominal aortic dilatation Hx CVA Dementia Stage III kidney disease  -Creatinine 1.41>> 1.57>>1.60 Hx hypertension Tobacco use x59 years/COPD Hx of ETOH use - ~1/5 per week at home   Colovesicular fistula UTI - culture pending  FEN: IV fluids/n.p.o. ID: Zosyn 8/11 >> day 3 DVT: Heparin Follow-up: Dr. Serafina Mitchell. Magod  Plan:  He needs to have the diarrhea issue resolved, and be able to maintain his weight before any surgery.  Dr. Maisie Fus recommend GI to help with diarrhea issue.  Follow up with Dr. Maisie Fus in our office after colonoscopy is completed.    LOS: 2 days    [name] Juarez 11/16/2019 Please see Amion

## 2019-11-16 NOTE — TOC Initial Note (Signed)
Transition of Care [facility]) - Initial/Assessment Note    Patient Details  Name: [name] Juarez MRN: [id number] Date of Birth: 1945/09/21  Transition of Care [facility]) CM/SW Contact:    Ida Rogue, LCSW Phone Number: 11/16/2019, 3:14 PM  Clinical Narrative:  Patient seen in follow up to MD consult for substance abuse.  [name] Juarez was eating lunch and visiting with his wife at the time of my visit.  He plans to return home at d/c, and is open to an OPPT appointment as recommended by PT.  No DME needed.  As for the alcohol, [name] Juarez adamantly denies an issue with this, stating that he has a bourbon every night at 9PM, which is the extent of his alcohol use.  His wife demanded to know where the information came from that he was drinking 4 glasses of wine and 21 beers a week.  I confessed I did not know, to which she replied she plans to find out. I believe her. CSW made referral to OPPT.  No further needs identified. TOC will continue to follow during the course of hospitalization.                  Expected Discharge Plan: OP Rehab Barriers to Discharge: No Barriers Identified   Patient Goals and CMS Choice Patient states their goals for this hospitalization and ongoing recovery are:: "It's good to eat again." CMS Medicare.gov Compare Post Acute Care list provided to:: Patient Choice offered to / list presented to : Patient, Spouse  Expected Discharge Plan and Services Expected Discharge Plan: OP Rehab In-house Referral: Clinical Social Work     Living arrangements for the past 2 months: Single Family Home                                      Prior Living Arrangements/Services Living arrangements for the past 2 months: Single Family Home Lives with:: Spouse Patient language and need for interpreter reviewed:: Yes Do you feel safe going back to the place where you live?: Yes      Need for Family Participation in Patient Care: Yes (Comment) Care giver support system in place?: Yes  (comment)   Criminal Activity/Legal Involvement Pertinent to Current Situation/Hospitalization: No - Comment as needed  Activities of Daily Living Home Assistive Devices/Equipment: None ADL Screening (condition at time of admission) Patient's cognitive ability adequate to safely complete daily activities?: Yes Is the patient deaf or have difficulty hearing?: No Does the patient have difficulty seeing, even when wearing glasses/contacts?: Yes (glasses for distance) Does the patient have difficulty concentrating, remembering, or making decisions?: No Patient able to express need for assistance with ADLs?: Yes Does the patient have difficulty dressing or bathing?: No Independently performs ADLs?: Yes (appropriate for developmental age) Does the patient have difficulty walking or climbing stairs?: No Weakness of Legs: Both Weakness of Arms/Hands: None  Permission Sought/Granted Permission sought to share information with : Family Supports Permission granted to share information with : Yes, Verbal Permission Granted  Share Information with NAME: wife [name] Juarez           Emotional Assessment Appearance:: Appears stated age Attitude/Demeanor/Rapport: Engaged Affect (typically observed): Appropriate Orientation: : Oriented to Self, Oriented to Place, Oriented to  Time, Oriented to Situation Alcohol / Substance Use: Not Applicable Psych Involvement: No (comment)  Admission diagnosis:  Syncope and collapse [R55] Colovesical fistula [N32.1] Acute cystitis without hematuria [N30.00]  Patient Active Problem List   Diagnosis Date Noted  . Colovesical fistula 11/14/2019  . Acute renal failure - resolving 08/08/2013  . Hypertension   . Acute appendicitis with peritonitis, perforation s/p lap appy 08/03/2013 08/03/2013  . Abdominal aneurysm without mention of rupture 09/28/2011  . Varicose veins of lower extremities with other complications 04/27/2011   PCP:  Johny Blamer, MD Pharmacy:    [facility] - [place], [place] - [address] [place] [address] Phone: [phone] Fax: [phone]     Social Determinants of Health (SDOH) Interventions    Readmission Risk Interventions No flowsheet data found.

## 2019-11-16 NOTE — Evaluation (Signed)
Physical Therapy Evaluation Patient Details Name: [name] Juarez MRN: [id number] DOB: 12-Jun-1945 Today's Date: 11/16/2019   History of Present Illness  Pt is a 74 y.o. male, with history of dementia, abdominal aortic aneurysm status post repair, CKD stage III. Reports syncopal episode at home with fall, abdominal pain and frequent diarhhea. Patient admitted for Colovesical fistula.  Clinical Impression  Pt admitted with above diagnosis.  Pt currently with functional limitations due to the deficits listed below (see PT Problem List). Pt will benefit from skilled PT to increase their independence and safety with mobility to allow discharge to the venue listed below.  Pt assisted with ambulating in hallway and performed a few LE exercises in standing.  Pt presents with generalized weakness however also observed Lt hip weakness > Rt hip.  Pt would benefit from OP PT follow up upon d/c.     Follow Up Recommendations Outpatient PT;Supervision for mobility/OOB    Equipment Recommendations  None recommended by PT    Recommendations for Other Services       Precautions / Restrictions Precautions Precautions: Fall Restrictions Weight Bearing Restrictions: No      Mobility  Bed Mobility Overal bed mobility: Modified Independent             General bed mobility comments: pt in recliner  Transfers Overall transfer level: Needs assistance Equipment used: Rolling walker (2 wheeled) Transfers: Sit to/from Stand Sit to Stand: Min guard         General transfer comment: min/guard for safety  Ambulation/Gait Ambulation/Gait assistance: Min guard Gait Distance (Feet): 400 Feet Assistive device: IV Pole Gait Pattern/deviations: Step-through pattern;Decreased stride length;Narrow base of support     General Gait Details: pt pushed IV pole for support, observed mild unsteadiness however no overt LOB, encourage to use cane at home for safety  Stairs            Wheelchair  Mobility    Modified Rankin (Stroke Patients Only)       Balance Overall balance assessment: Needs assistance Sitting-balance support: No upper extremity supported;Feet supported Sitting balance-Leahy Scale: Fair     Standing balance support: No upper extremity supported Standing balance-Leahy Scale: Fair Standing balance comment: static standing without support, unable to tolerate challenges with UE support                             Pertinent Vitals/Pain Pain Assessment: No/denies pain    Home Living Family/patient expects to be discharged to:: Private residence Living Arrangements: Spouse/significant other Available Help at Discharge: Family;Available 24 hours/day Type of Home: House Home Access: Stairs to enter Entrance Stairs-Rails: None Entrance Stairs-Number of Steps: 2 Home Layout: One level Home Equipment: Shower seat - built in;Cane - quad      Prior Function Level of Independence: Independent               Hand Dominance   Dominant Hand: Right    Extremity/Trunk Assessment   Upper Extremity Assessment Upper Extremity Assessment: Overall WFL for tasks assessed    Lower Extremity Assessment Lower Extremity Assessment: Generalized weakness;LLE deficits/detail LLE Deficits / Details: left hip strength grossly 2+/5 with standing movement    Cervical / Trunk Assessment Cervical / Trunk Assessment: Normal  Communication   Communication: No difficulties  Cognition Arousal/Alertness: Awake/alert Behavior During Therapy: WFL for tasks assessed/performed Overall Cognitive Status: Within Functional Limits for tasks assessed  General Comments      Exercises General Exercises - Lower Extremity Hip ABduction/ADduction: AROM;Limitations;10 reps;Standing (all standing exercises required UE support) Hip Abduction/Adduction Limitations: left abduction with less ROM and difficulty with  maintaining knee extension Hip Flexion/Marching: AROM;Both;10 reps;Standing Heel Raises: AROM;Both;10 reps;Standing Mini-Sqauts: AROM;Both;10 reps;Standing   Assessment/Plan    PT Assessment Patient needs continued PT services  PT Problem List Decreased balance;Decreased activity tolerance;Decreased strength;Decreased mobility;Decreased knowledge of use of DME       PT Treatment Interventions DME instruction;Therapeutic exercise;Gait training;Balance training;Functional mobility training;Therapeutic activities;Patient/family education;Stair training    PT Goals (Current goals can be found in the Care Plan section)  Acute Rehab PT Goals Patient Stated Goal: To go home PT Goal Formulation: With patient Time For Goal Achievement: 11/23/19 Potential to Achieve Goals: Good    Frequency Min 3X/week   Barriers to discharge        Co-evaluation               AM-PAC PT "6 Clicks" Mobility  Outcome Measure Help needed turning from your back to your side while in a flat bed without using bedrails?: None Help needed moving from lying on your back to sitting on the side of a flat bed without using bedrails?: None Help needed moving to and from a bed to a chair (including a wheelchair)?: A Little Help needed standing up from a chair using your arms (e.g., wheelchair or bedside chair)?: A Little Help needed to walk in hospital room?: A Little Help needed climbing 3-5 steps with a railing? : A Little 6 Click Score: 20    End of Session Equipment Utilized During Treatment: Gait belt Activity Tolerance: Patient tolerated treatment well Patient left: in chair;with chair alarm set;with family/visitor present;with call bell/phone within reach   PT Visit Diagnosis: Difficulty in walking, not elsewhere classified (R26.2);Muscle weakness (generalized) (M62.81)    Time: 2993-7169 PT Time Calculation (min) (ACUTE ONLY): 18 min   Charges:   PT Evaluation $PT Eval Low Complexity: 1  Low         Kati PT, DPT Acute Rehabilitation Services Pager: [phone] Office: [phone]  Sarajane Jews 11/16/2019, 2:38 PM

## 2019-11-16 NOTE — Progress Notes (Signed)
Came by to see patient this morning was sleeping too comfortably and did not want to wake up but will be on standby for outpatient colonoscopy prior to surgery however if proceeding early next week just call Dr. Levora Angel this weekend to set up colonoscopy at appropriate time and please call us if any other questions or problems from a GI standpoint

## 2019-11-16 NOTE — Evaluation (Signed)
Occupational Therapy Evaluation Patient Details Name: [name] Juarez MRN: [id number] DOB: Sep 06, 1945 Today's Date: 11/16/2019    History of Present Illness [name] Juarez  is a 74 y.o. male, with history of dementia, abdominal aortic aneurysm status post repair, CKD stage III. Reports syncopal episode at home with fall, abdominal pain and frequent diarhhea. Patient admitted for Colovesical fistula.   Clinical Impression   [name] Juarez is a 74 year old man who presents with functional strength of upper extremities, generalized weakness, decreased activity tolerance, impaired balance and a shuffling gait resulting in decreased independence with ADLs and mobility. Patient min assist for LB dressing, toileting and LB bathing, set up assist for UB ADLs Patient able to ambulate to bathroom with min assist and two mild loss of balances that patient able to correct. Patient will benefit from skilled OT services while in hospital to improve deficits and learn compensatory strategies if needed in order to return home at discharge.      Follow Up Recommendations  No OT follow up    Equipment Recommendations  None recommended by OT    Recommendations for Other Services       Precautions / Restrictions Precautions Precautions: Fall Restrictions Weight Bearing Restrictions: No      Mobility Bed Mobility Overal bed mobility: Modified Independent             General bed mobility comments: Increased time and bed rail to transfer into sitting.  Transfers Overall transfer level: Needs assistance   Transfers: Sit to/from Stand Sit to Stand: Min assist         General transfer comment: Min asist to stand from bed. Ambulated in room without device - needed steadying assist. Used IV pole on return from bathroom to steady himself.    Balance Overall balance assessment: Needs assistance Sitting-balance support: No upper extremity supported;Feet supported Sitting balance-Leahy Scale: Fair      Standing balance support: Single extremity supported Standing balance-Leahy Scale: Poor                             ADL either performed or assessed with clinical judgement   ADL Overall ADL's : Needs assistance/impaired Eating/Feeding: Independent   Grooming: Standing;Wash/dry hands;Wash/dry face;Min guard Grooming Details (indicate cue type and reason): stood at sink to perform grooming with min guard. Upper Body Bathing: Set up;Sitting   Lower Body Bathing: Minimal assistance;Sit to/from stand   Upper Body Dressing : Set up;Sitting   Lower Body Dressing: Minimal assistance;Sit to/from stand Lower Body Dressing Details (indicate cue type and reason): Min assist to get underwear over feet. Needs steadying assist with standing. Toilet Transfer: Regular Pensions consultant;Ambulation;Minimal assistance Toilet Transfer Details (indicate cue type and reason): Min assist for steadying. Toileting- Architect and Hygiene: Sit to/from stand;Minimal assistance               Vision   Vision Assessment?: No apparent visual deficits     Perception     Praxis      Pertinent Vitals/Pain Pain Assessment: No/denies pain     Hand Dominance Right   Extremity/Trunk Assessment Upper Extremity Assessment Upper Extremity Assessment: Overall WFL for tasks assessed   Lower Extremity Assessment Lower Extremity Assessment: Defer to PT evaluation   Cervical / Trunk Assessment Cervical / Trunk Assessment: Normal   Communication Communication Communication: No difficulties   Cognition Arousal/Alertness: Awake/alert Behavior During Therapy: WFL for tasks assessed/performed Overall Cognitive Status: Within Functional Limits for  tasks assessed                                     General Comments       Exercises     Shoulder Instructions      Home Living Family/patient expects to be discharged to:: Private residence Living Arrangements:  Spouse/significant other Available Help at Discharge: Family;Available 24 hours/day Type of Home: House Home Access: Stairs to enter [employer] of Steps: 2 Entrance Stairs-Rails: None Home Layout: One level     Bathroom Shower/Tub: Adult nurse: No   Home Equipment: Shower seat - built in;Cane - quad          Prior Functioning/Environment Level of Independence: Independent                 OT Problem List: Decreased activity tolerance;Impaired balance (sitting and/or standing)      OT Treatment/Interventions: Self-care/ADL training;Therapeutic activities;Therapeutic exercise;Patient/family education    OT Goals(Current goals can be found in the care plan section) Acute Rehab OT Goals Patient Stated Goal: To go home OT Goal Formulation: With patient/family Time For Goal Achievement: 11/30/19 Potential to Achieve Goals: Good  OT Frequency: Min 2X/week   Barriers to D/C:            Co-evaluation              AM-PAC OT "6 Clicks" Daily Activity     Outcome Measure Help from another person eating meals?: None Help from another person taking care of personal grooming?: A Little Help from another person toileting, which includes using toliet, bedpan, or urinal?: A Little Help from another person bathing (including washing, rinsing, drying)?: A Little Help from another person to put on and taking off regular upper body clothing?: None Help from another person to put on and taking off regular lower body clothing?: A Little 6 Click Score: 20   End of Session Equipment Utilized During Treatment: Gait belt Nurse Communication: Mobility status  Activity Tolerance: Patient tolerated treatment well Patient left: in chair;with call bell/phone within reach;with chair alarm set  OT Visit Diagnosis: Unsteadiness on feet (R26.81);Muscle weakness (generalized) (M62.81)                Time: 2376-2831 OT Time Calculation (min):  36 min Charges:  OT General Charges $OT Visit: 1 Visit OT Evaluation $OT Eval Low Complexity: 1 Low OT Treatments $Self Care/Home Management : 8-22 mins  [name] Juarez, OTR/L Acute Care Rehab Services  Office [phone] Pager: [phone]   Kelli Churn 11/16/2019, 1:10 PM

## 2019-11-16 NOTE — Progress Notes (Signed)
PROGRESS NOTE    [name] Juarez  DJS:[id number] DOB: [birth year] DOA: 11/14/2019 PCP: Johny Blamer, MD   Brief Narrative:  Patient is a 74 year old male with history of dementia, abdominal aortic aneurysm status post repair, CKD stage IIIb who presents with complaint of syncopal.  CT head did not show any acute intracranial abnormalities.  Patient reported seeing gas bubbles in the urine.  Hemodynamically stable on presentation.  CT abdomen/pelvis concerning for colovesical fistula, severe sigmoid diverticulosis.  He had leukocytosis on presentation.  General surgery consulted.  Started on Zosyn.  Plan is repeat colonoscopy and possible surgery as an outpatient.     Assessment & Plan:   Active Problems:   Colovesical fistula   Syncope: Most likely hypovolemic/orthostatic syncope for diarrhea .echocardiogram showed ejection fraction 50 to 55%, no regional wall motion abnormality, no valvular abnormalities.  Troponins negative. Negative orthostatic vitals.  Monitor on telemetry.  Colovesical fistula: As seen on the CT. showed moderate gas within the bladder lumen/intramural gas.  Etiology could be from remote diverticulitis.  Patient reported seeing gas bubbles in the urine.  General surgery consulted.  Continue Zosyn for now.  General surgery and GI recommending outpatient follow-up for colonoscopy versus surgery. We will change antibiotics to Augmentin on discharge.  His leukocytosis has improved with IV antibiotics will continue IV antibiotics till discharge.  Diet advanced to regular  Diarrhea: Reports diarrhea for the last 6 months.  Follows with Dr. Ewing Schlein.  Recently taking antibiotic at home.  Negative C. difficile PCR and negative GI pathogen panel. Patient continues to have diarrhea.  Started on Imodium  UTI: Currently afebrile.  Urine culture did not show any significant growth.  Lactic acidosis: resolved  Ethanol use: Monitor on CIWA protocol.  On folic acid and thiamine  CKD  stage IIIb: Creatinine trended up mildly.  Baseline creatinine around 1.4, most likely this is from the diarrhea.  Continue IV fluids  Dementia: On donepezil.  Continue supportive care.  Delirium precautions.  PT/deconditioning/generalized weakness: PT/OT did not recommend outpatient follow-up.         DVT prophylaxis:Heparin [place] Code Status: Full Family Communication: Wife at the bedside Status is: Inpatient  Remains inpatient appropriate because:IV treatments appropriate due to intensity of illness or inability to take PO   Dispo: The patient is from: Home              Anticipated d/c is to: Home              Anticipated d/c date is: 1-2 days              Patient currently is not medically stable to d/c. Kidney function should improve and diarrhea should improve before discharge.    Consultants: Surgery  Procedures:None  Antimicrobials:  Anti-infectives (From admission, onward)   Start     Dose/Rate Route Frequency Ordered Stop   11/15/19 0600  piperacillin-tazobactam (ZOSYN) IVPB 3.375 g     Discontinue     3.375 g 12.5 mL/hr over 240 Minutes Intravenous Every 8 hours 11/14/19 2316     11/14/19 2315  piperacillin-tazobactam (ZOSYN) IVPB 3.375 g  Status:  Discontinued        3.375 g 100 mL/hr over 30 Minutes Intravenous Every 8 hours 11/14/19 2311 11/14/19 2316   11/14/19 1945  piperacillin-tazobactam (ZOSYN) IVPB 3.375 g        3.375 g 100 mL/hr over 30 Minutes Intravenous  Once 11/14/19 1939 11/14/19 2018      Subjective:  Patient seen  and examined the bedside this morning.  Hemodynamically stable.  Lying in the bed.  Complains of weakness.  Continues to have diarrhea.  Denies any abdominal pain, nausea or vomiting  Objective: Vitals:   11/15/19 1310 11/15/19 1311 11/15/19 2006 11/16/19 0435  BP: (!) 111/53 129/63 132/65 133/67  Pulse: 67 80 62 78  Resp:   18 20  Temp:   98.8 F (37.1 C) 98.8 F (37.1 C)  TempSrc:      SpO2: 94% 95% 94% 92%  Weight:       Height:        Intake/Output Summary (Last 24 hours) at 11/16/2019 0813 Last data filed at 11/16/2019 0500 Gross per 24 hour  Intake 915.64 ml  Output 100 ml  Net 815.64 ml   Filed Weights   11/14/19 1445  Weight: 69.4 kg    Examination:  General exam: Not in distress,average built HEENT:PERRL,Oral mucosa moist, Ear/Nose normal on gross exam Respiratory system: Bilateral equal air entry, normal vesicular breath sounds, no wheezes or crackles  Cardiovascular system: S1 & S2 heard, RRR. No JVD, murmurs, rubs, gallops or clicks. Gastrointestinal system: Abdomen is nondistended, soft and nontender. No organomegaly or masses felt. Normal bowel sounds heard. Central nervous system: Alert and oriented. No focal neurological deficits. Extremities: No edema, no clubbing ,no cyanosis Skin: No rashes, lesions or ulcers,no icterus ,no pallor     Data Reviewed: I have personally reviewed following labs and imaging studies  CBC: Recent Labs  Lab 11/14/19 1536 11/16/19 0407  WBC 21.2* 11.7*  NEUTROABS 18.9* 9.6*  HGB 12.5* 10.6*  HCT 39.5 33.8*  MCV 94.5 95.2  PLT 340 253   Basic Metabolic Panel: Recent Labs  Lab 11/14/19 1536 11/15/19 0130 11/16/19 0407  NA 131* 133* 137  K 4.3 4.4 3.9  CL 100 105 106  CO2 21* 20* 23  GLUCOSE 132* 150* 119*  BUN 29* 27* 23  CREATININE 1.41* 1.57* 1.60*  CALCIUM 9.0 8.3* 8.6*  MG 1.9 2.0  --    GFR: Estimated Creatinine Clearance: 37.3 mL/min (A) (by C-G formula based on SCr of 1.6 mg/dL (H)). Liver Function Tests: Recent Labs  Lab 11/14/19 1536 11/15/19 0130  AST 23 17  ALT 26 23  ALKPHOS 60 48  BILITOT 0.5 1.1  PROT 7.7 6.4*  ALBUMIN 4.4 3.5   Recent Labs  Lab 11/14/19 1536 11/15/19 0130  LIPASE 255* 53*   No results for input(s): AMMONIA in the last 168 hours. Coagulation Profile: Recent Labs  Lab 11/15/19 0130  INR 1.1   Cardiac Enzymes: No results for input(s): CKTOTAL, CKMB, CKMBINDEX, TROPONINI in the  last 168 hours. BNP (last 3 results) No results for input(s): PROBNP in the last 8760 hours. HbA1C: No results for input(s): HGBA1C in the last 72 hours. CBG: No results for input(s): GLUCAP in the last 168 hours. Lipid Profile: No results for input(s): CHOL, HDL, LDLCALC, TRIG, CHOLHDL, LDLDIRECT in the last 72 hours. Thyroid Function Tests: Recent Labs    11/14/19 1536  TSH 1.526   Anemia Panel: No results for input(s): VITAMINB12, FOLATE, FERRITIN, TIBC, IRON, RETICCTPCT in the last 72 hours. Sepsis Labs: Recent Labs  Lab 11/14/19 1536 11/14/19 1750 11/14/19 2326 11/15/19 0130  LATICACIDVEN 1.6 2.0* 0.9 0.7    Recent Results (from the past 240 hour(s))  Urine culture     Status: Abnormal   Collection Time: 11/14/19  3:36 PM   Specimen: Urine, Random  Result Value Ref Range Status  Specimen Description   Final    URINE, RANDOM Performed at [facility], [address] [place], [place] [address]    Special Requests   Final    NONE Performed at [facility], [address] [place], [place] [address]    Culture (A)  Final    <10,000 COLONIES/mL INSIGNIFICANT GROWTH Performed at [facility], [address], [place], [place] [address]    Report Status 11/15/2019 FINAL  Final  SARS Coronavirus 2 by RT PCR (hospital order, performed in [facility] hospital lab) Nasopharyngeal Nasopharyngeal Swab     Status: None   Collection Time: 11/14/19  3:36 PM   Specimen: Nasopharyngeal Swab  Result Value Ref Range Status   SARS Coronavirus 2 NEGATIVE NEGATIVE Final    Comment: (NOTE) SARS-CoV-2 target nucleic acids are NOT DETECTED.  The SARS-CoV-2 RNA is generally detectable in upper and lower respiratory specimens during the acute phase of infection. The lowest concentration of SARS-CoV-2 viral copies this assay can detect is 250 copies / mL. A negative result does not preclude SARS-CoV-2 infection and should not be used as the sole basis for  treatment or other patient management decisions.  A negative result may occur with improper specimen collection / handling, submission of specimen other than nasopharyngeal swab, presence of viral mutation(s) within the areas targeted by this assay, and inadequate number of viral copies (<250 copies / mL). A negative result must be combined with clinical observations, patient history, and epidemiological information.  Fact Sheet for Patients:   BoilerBrush.com.cy  Fact Sheet for Healthcare Providers: https://pope.com/  This test is not yet approved or  cleared by the [place] FDA and has been authorized for detection and/or diagnosis of SARS-CoV-2 by FDA under an Emergency Use Authorization (EUA).  This EUA will remain in effect (meaning this test can be used) for the duration of the COVID-19 declaration under Section 564(b)(1) of the Act, 21 U.S.C. section 360bbb-3(b)(1), unless the authorization is terminated or revoked sooner.  Performed at [facility], [address] [place], [place] [address]   C Difficile Quick Screen w PCR reflex     Status: Abnormal   Collection Time: 11/15/19 10:16 AM   Specimen: STOOL  Result Value Ref Range Status   C Diff antigen POSITIVE (A) NEGATIVE Final   C Diff toxin NEGATIVE NEGATIVE Final   C Diff interpretation Results are indeterminate. See PCR results.  Final    Comment: Performed at [facility], [address] [place], [place] [address]  Gastrointestinal Panel by PCR , Stool     Status: None   Collection Time: 11/15/19 10:16 AM   Specimen: STOOL  Result Value Ref Range Status   Campylobacter species NOT DETECTED NOT DETECTED Final   Plesimonas shigelloides NOT DETECTED NOT DETECTED Final   Salmonella species NOT DETECTED NOT DETECTED Final   Yersinia enterocolitica NOT DETECTED NOT DETECTED Final   Vibrio species NOT DETECTED NOT DETECTED Final    Vibrio cholerae NOT DETECTED NOT DETECTED Final   Enteroaggregative E coli (EAEC) NOT DETECTED NOT DETECTED Final   Enteropathogenic E coli (EPEC) NOT DETECTED NOT DETECTED Final   Enterotoxigenic E coli (ETEC) NOT DETECTED NOT DETECTED Final   Shiga like toxin producing E coli (STEC) NOT DETECTED NOT DETECTED Final   Shigella/Enteroinvasive E coli (EIEC) NOT DETECTED NOT DETECTED Final   Cryptosporidium NOT DETECTED NOT DETECTED Final   Cyclospora cayetanensis NOT DETECTED  NOT DETECTED Final   Entamoeba histolytica NOT DETECTED NOT DETECTED Final   Giardia lamblia NOT DETECTED NOT DETECTED Final   Adenovirus F40/41 NOT DETECTED NOT DETECTED Final   Astrovirus NOT DETECTED NOT DETECTED Final   Norovirus GI/GII NOT DETECTED NOT DETECTED Final   Rotavirus A NOT DETECTED NOT DETECTED Final   Sapovirus (I, II, IV, and V) NOT DETECTED NOT DETECTED Final    Comment: Performed at [facility], [address], [place], [place] [address]  C. Diff by PCR, Reflexed     Status: None   Collection Time: 11/15/19 10:16 AM  Result Value Ref Range Status   Toxigenic C. Difficile by PCR NEGATIVE NEGATIVE Final    Comment: Patient is colonized with non toxigenic C. difficile. May not need treatment unless significant symptoms are present. Performed at [facility], [address] [place], [place] [address]          Radiology Studies: CT Head Wo Contrast  Result Date: 11/14/2019 CLINICAL DATA:  Head trauma, moderate/severe, hit head. Additional provided: Patient treated for UTI for 1 week, weakness today, diarrhea, fell backwards due to syncope. EXAM: CT HEAD WITHOUT CONTRAST TECHNIQUE: Contiguous axial images were obtained from the base of the skull through the vertex without intravenous contrast. COMPARISON:  Brain MRI 01/16/2019 FINDINGS: Brain: Stable, moderate generalized parenchymal atrophy Redemonstrated chronic cortical/subcortical infarct within the right frontal lobe. Stable  background mild multifocal hypoattenuation within the cerebral white matter which is nonspecific, but consistent with chronic small vessel ischemic disease. There is no acute intracranial hemorrhage. No demarcated cortical infarct. No extra-axial fluid collection. No evidence of intracranial mass. No midline shift. Vascular: No hyperdense vessel. Skull: Normal. Negative for fracture or focal lesion. Sinuses/Orbits: Visualized orbits show no acute finding. No significant paranasal sinus disease or mastoid effusion at the imaged levels. Other: Left parietal scalp hematoma. IMPRESSION: No evidence of acute intracranial abnormality. Left parietal scalp hematoma. Redemonstrated chronic cortical/subcortical right frontal lobe infarct. Stable background moderate generalized parenchymal atrophy and mild chronic small vessel ischemic disease. Electronically Signed   By: Jackey Loge DO   On: 11/14/2019 16:37   CT Chest Wo Contrast  Result Date: 11/14/2019 CLINICAL DATA:  Leukocytosis, nausea, vomiting, pancreatitis EXAM: CT CHEST WITHOUT CONTRAST AND CT ABDOMEN AND PELVIS WITH CONTRAST TECHNIQUE: Multidetector CT imaging of the chest, abdomen and pelvis was performed. Initial CT imaging of the chest was performed without contrast administration. Subsequently, CT imaging of the abdomen and pelvis was performed utilizing 80 cc of Omnipaque 300 intravenous iodinated contrast. COMPARISON:  12/18/2013 FINDINGS: CT CHEST FINDINGS Cardiovascular: Extensive multi-vessel coronary artery calcification. Global cardiac size within normal limits. No pericardial effusion. Central pulmonary arteries are enlarged suggesting changes of pulmonary arterial hypertension. Mild atherosclerotic calcification is seen within the thoracic aorta without evidence of aneurysm. Mediastinum/Nodes: No enlarged mediastinal, hilar, or axillary lymph nodes. Thyroid gland, trachea, and esophagus demonstrate no significant findings. Lungs/Pleura: Moderate  centrilobular emphysema. Mild right basilar atelectasis. No focal pulmonary nodule or infiltrate. No pneumothorax or pleural effusion. Central airways are widely pa tiny cortical cyst within the upper pole of the left kidney. No hydronephrosis. No intrarenal or ureteral calculi. Gas within the bladder lumen may relate to catheterization, tent. Musculoskeletal: No chest wall mass or suspicious bone lesions identified. CT ABDOMEN PELVIS FINDINGS Hepatobiliary: Liver unremarkable. Gallbladder unremarkable. No intra or extrahepatic biliary ductal dilation. Pancreas: The pancreas is unremarkable. Specifically, there is normal pancreatic parenchymal enhancement. No pancreatic ductal dilation. No pancreatic calcifications  are identified. No peripancreatic inflammatory stranding or peripancreatic fluid collections are identified. Spleen: Capsular calcifications involving the lateral splenic capsule may relate to remote trauma. The spleen is otherwise unremarkable. Adrenals/Urinary Tract: Adrenal glands are unremarkable. Kidneys are normal in size and position. Simple cortical cyst noted within the upper pole of the left kidney. No hydronephrosis. No intrarenal or ureteral calculi. There is moderate gas noted within the bladder lumen. Additionally, there is focal thickening of the bladder wall and intramural gas identified, best noted on axial image # 66/2 and sagittal image # 67/6 suspicious for a colovesicular fistula, possibly result of remote diverticulitis. Stomach/Bowel: There is severe sigmoid diverticulosis without superimposed inflammatory change. Scattered diverticular are seen within the descending colon. Appendectomy has been performed. Stomach and small bowel are unremarkable. No free intraperitoneal gas or fluid. Vascular/Lymphatic: Endovascular repair of an infrarenal abdominal aortic aneurysm utilizing a bifurcated stent graft has been performed there is progressive aneurysmal dilation of the juxtarenal  abdominal aorta, measuring 3.6 x 3.3 cm just above the proximal landing zone of the stent graft, previously measuring 3.0 x 2.8 cm. This is best seen on axial image # 26/2 and sagittal image # 61/6. The infrarenal abdominal aortic aneurysm has decreased in maximal caliber measuring 3.7 cm at axial image # 34/2, previously measuring 4.3 cm. Moderate plaque within the lower extremity arterial inflow bilaterally. No pathologic adenopathy within the abdomen and pelvis. Reproductive: Mild prostatic enlargement. Seminal vesicles are unremarkable. Other: Rectum unremarkable. Prominent superficial venous varicosity identified within the anterior visualized right thigh. Musculoskeletal: No acute bone abnormality. IMPRESSION: 1. There is moderate gas within the bladder lumen, with focal thickening of the bladder wall and intramural gas identified. Findings are suspicious for a colovesicular fistula, possibly result of remote diverticulitis. Correlation with urinalysis is recommended. 2. Endovascular repair of an infrarenal abdominal aortic aneurysm utilizing a bifurcated stent graft. There is progressive aneurysmal dilation of the juxtarenal abdominal aorta, measuring 3.6 x 3.3 cm, previously measuring 3.0 x 2.8 cm. The infrarenal abdominal aortic aneurysm has decreased in maximal caliber measuring 3.7 cm, previously measuring 4.3 cm. 3. Severe sigmoid diverticulosis without superimposed inflammatory change. 4. Moderate centrilobular emphysema. 5. Enlarged central pulmonary arteries suggesting changes of pulmonary arterial hypertension. 6. No CT evidence of acute or chronic pancreatitis. Aortic Atherosclerosis (ICD10-I70.0) and Emphysema (ICD10-J43.9). Electronically Signed   By: Helyn Numbers MD   On: 11/14/2019 18:08   CT ABDOMEN PELVIS W CONTRAST  Result Date: 11/14/2019 CLINICAL DATA:  Leukocytosis, nausea, vomiting, pancreatitis EXAM: CT CHEST WITHOUT CONTRAST AND CT ABDOMEN AND PELVIS WITH CONTRAST TECHNIQUE:  Multidetector CT imaging of the chest, abdomen and pelvis was performed. Initial CT imaging of the chest was performed without contrast administration. Subsequently, CT imaging of the abdomen and pelvis was performed utilizing 80 cc of Omnipaque 300 intravenous iodinated contrast. COMPARISON:  12/18/2013 FINDINGS: CT CHEST FINDINGS Cardiovascular: Extensive multi-vessel coronary artery calcification. Global cardiac size within normal limits. No pericardial effusion. Central pulmonary arteries are enlarged suggesting changes of pulmonary arterial hypertension. Mild atherosclerotic calcification is seen within the thoracic aorta without evidence of aneurysm. Mediastinum/Nodes: No enlarged mediastinal, hilar, or axillary lymph nodes. Thyroid gland, trachea, and esophagus demonstrate no significant findings. Lungs/Pleura: Moderate centrilobular emphysema. Mild right basilar atelectasis. No focal pulmonary nodule or infiltrate. No pneumothorax or pleural effusion. Central airways are widely pa tiny cortical cyst within the upper pole of the left kidney. No hydronephrosis. No intrarenal or ureteral calculi. Gas within the bladder lumen may relate to catheterization, tent.  Musculoskeletal: No chest wall mass or suspicious bone lesions identified. CT ABDOMEN PELVIS FINDINGS Hepatobiliary: Liver unremarkable. Gallbladder unremarkable. No intra or extrahepatic biliary ductal dilation. Pancreas: The pancreas is unremarkable. Specifically, there is normal pancreatic parenchymal enhancement. No pancreatic ductal dilation. No pancreatic calcifications are identified. No peripancreatic inflammatory stranding or peripancreatic fluid collections are identified. Spleen: Capsular calcifications involving the lateral splenic capsule may relate to remote trauma. The spleen is otherwise unremarkable. Adrenals/Urinary Tract: Adrenal glands are unremarkable. Kidneys are normal in size and position. Simple cortical cyst noted within the  upper pole of the left kidney. No hydronephrosis. No intrarenal or ureteral calculi. There is moderate gas noted within the bladder lumen. Additionally, there is focal thickening of the bladder wall and intramural gas identified, best noted on axial image # 66/2 and sagittal image # 67/6 suspicious for a colovesicular fistula, possibly result of remote diverticulitis. Stomach/Bowel: There is severe sigmoid diverticulosis without superimposed inflammatory change. Scattered diverticular are seen within the descending colon. Appendectomy has been performed. Stomach and small bowel are unremarkable. No free intraperitoneal gas or fluid. Vascular/Lymphatic: Endovascular repair of an infrarenal abdominal aortic aneurysm utilizing a bifurcated stent graft has been performed there is progressive aneurysmal dilation of the juxtarenal abdominal aorta, measuring 3.6 x 3.3 cm just above the proximal landing zone of the stent graft, previously measuring 3.0 x 2.8 cm. This is best seen on axial image # 26/2 and sagittal image # 61/6. The infrarenal abdominal aortic aneurysm has decreased in maximal caliber measuring 3.7 cm at axial image # 34/2, previously measuring 4.3 cm. Moderate plaque within the lower extremity arterial inflow bilaterally. No pathologic adenopathy within the abdomen and pelvis. Reproductive: Mild prostatic enlargement. Seminal vesicles are unremarkable. Other: Rectum unremarkable. Prominent superficial venous varicosity identified within the anterior visualized right thigh. Musculoskeletal: No acute bone abnormality. IMPRESSION: 1. There is moderate gas within the bladder lumen, with focal thickening of the bladder wall and intramural gas identified. Findings are suspicious for a colovesicular fistula, possibly result of remote diverticulitis. Correlation with urinalysis is recommended. 2. Endovascular repair of an infrarenal abdominal aortic aneurysm utilizing a bifurcated stent graft. There is progressive  aneurysmal dilation of the juxtarenal abdominal aorta, measuring 3.6 x 3.3 cm, previously measuring 3.0 x 2.8 cm. The infrarenal abdominal aortic aneurysm has decreased in maximal caliber measuring 3.7 cm, previously measuring 4.3 cm. 3. Severe sigmoid diverticulosis without superimposed inflammatory change. 4. Moderate centrilobular emphysema. 5. Enlarged central pulmonary arteries suggesting changes of pulmonary arterial hypertension. 6. No CT evidence of acute or chronic pancreatitis. Aortic Atherosclerosis (ICD10-I70.0) and Emphysema (ICD10-J43.9). Electronically Signed   By: Helyn NumbersAshesh  Parikh MD   On: 11/14/2019 18:08   DG Chest Portable 1 View  Result Date: 11/14/2019 CLINICAL DATA:  Syncope, cough, fatigue EXAM: PORTABLE CHEST 1 VIEW COMPARISON:  10/29/2016 FINDINGS: The lungs are symmetrically hyperinflated in keeping with changes of underlying COPD. No superimposed focal pulmonary infiltrate. Linear lucencies at the right apex in along the right lateral mid lung zone likely represent skin folds, however, a small apical pneumothorax is difficult to exclude on this examination. No pneumothorax on the left. No pleural effusion. Cardiac size within normal limits. Pulmonary vascularity normal. No acute bone abnormality. IMPRESSION: 1. Findings of COPD. 2. Linear lucencies at the right apex and along the right lateral mid lung zone likely represent skin folds, however, a small apical pneumothorax is difficult to exclude on this examination. A standard two view chest radiograph in the expiratory phase or a left lateral  decubitus chest radiograph would be helpful in excluding a a pneumothorax. Electronically Signed   By: Helyn Numbers MD   On: 11/14/2019 16:34   ECHOCARDIOGRAM COMPLETE  Result Date: 11/15/2019    ECHOCARDIOGRAM REPORT   Patient Name:   TINSLEY LOMAS Date of Exam: 11/15/2019 Medical Rec #:  [id number]  Height:       67.0 in Accession #:    [id number] Weight:       153.0 lb Date of Birth:  07/26/1945    BSA:          1.805 m Patient Age:    74 years   BP:           112/50 mmHg Patient Gender: M          HR:           67 bpm. Exam Location:  Inpatient Procedure: 2D Echo Indications:    Syncope R55  History:        Patient has no prior history of Echocardiogram examinations.                 Risk Factors:Hypertension and Dyslipidemia.  Sonographer:    Thurman Coyer RDCS (AE) Referring Phys: [id number] ASIA B ZIERLE-GHOSH IMPRESSIONS  1. Left ventricular ejection fraction, by estimation, is 50 to 55%. The left ventricle has low normal function. The left ventricle has no regional wall motion abnormalities. Left ventricular diastolic parameters were normal.  2. Right ventricular systolic function is normal. The right ventricular size is normal. There is normal pulmonary artery systolic pressure.  3. The mitral valve is grossly normal. No evidence of mitral valve regurgitation.  4. The aortic valve is tricuspid. Aortic valve regurgitation is not visualized. FINDINGS  Left Ventricle: Left ventricular ejection fraction, by estimation, is 50 to 55%. The left ventricle has low normal function. The left ventricle has no regional wall motion abnormalities. The left ventricular internal cavity size was normal in size. There is no left ventricular hypertrophy. Left ventricular diastolic parameters were normal. Right Ventricle: The right ventricular size is normal. No increase in right ventricular wall thickness. Right ventricular systolic function is normal. There is normal pulmonary artery systolic pressure. The tricuspid regurgitant velocity is 1.74 m/s, and  with an assumed right atrial pressure of 3 mmHg, the estimated right ventricular systolic pressure is 15.1 mmHg. Left Atrium: Left atrial size was normal in size. Right Atrium: Right atrial size was normal in size. Pericardium: There is no evidence of pericardial effusion. Mitral Valve: The mitral valve is grossly normal. No evidence of mitral valve regurgitation.  Tricuspid Valve: The tricuspid valve is grossly normal. Tricuspid valve regurgitation is trivial. No evidence of tricuspid stenosis. Aortic Valve: The aortic valve is tricuspid. . There is mild thickening and mild calcification of the aortic valve. Aortic valve regurgitation is not visualized. There is mild thickening of the aortic valve. There is mild calcification of the aortic valve. Pulmonic Valve: The pulmonic valve was grossly normal. Pulmonic valve regurgitation is not visualized. Aorta: The aortic root and ascending aorta are structurally normal, with no evidence of dilitation. IAS/Shunts: The atrial septum is grossly normal.  LEFT VENTRICLE PLAX 2D LVIDd:         3.60 cm  Diastology LVIDs:         2.60 cm  LV e' lateral:   10.10 cm/s LV PW:         1.00 cm  LV E/e' lateral: 8.5 LV IVS:  1.00 cm  LV e' medial:    7.29 cm/s LVOT diam:     2.50 cm  LV E/e' medial:  11.8 LV SV:         97 LV SV Index:   54 LVOT Area:     4.91 cm  RIGHT VENTRICLE RV S prime:     14.50 cm/s TAPSE (M-mode): 2.0 cm LEFT ATRIUM             Index       RIGHT ATRIUM           Index LA diam:        3.30 cm 1.83 cm/m  RA Area:     12.80 cm LA Vol (A2C):   42.4 ml 23.50 ml/m RA Volume:   27.00 ml  14.96 ml/m LA Vol (A4C):   35.7 ml 19.78 ml/m LA Biplane Vol: 39.7 ml 22.00 ml/m  AORTIC VALVE LVOT Vmax:   94.00 cm/s LVOT Vmean:  62.900 cm/s LVOT VTI:    0.198 m  AORTA Ao Root diam: 3.20 cm MITRAL VALVE               TRICUSPID VALVE MV Area (PHT): 3.39 cm    TR Peak grad:   12.1 mmHg MV Decel Time: 224 msec    TR Vmax:        174.00 cm/s MV E velocity: 86.10 cm/s MV A velocity: 83.60 cm/s  SHUNTS MV E/A ratio:  1.03        Systemic VTI:  0.20 m                            Systemic Diam: 2.50 cm Kristeen Miss MD Electronically signed by Kristeen Miss MD Signature Date/Time: 11/15/2019/11:33:16 AM    Final         Scheduled Meds: . donepezil  10 mg Oral Daily  . folic acid  1 mg Oral Daily  . heparin  5,000 Units  Subcutaneous Q8H  . LORazepam  0-4 mg Intravenous Q6H   Followed by  . [START ON 11/17/2019] LORazepam  0-4 mg Intravenous Q12H  . multivitamin with minerals  1 tablet Oral Daily  . thiamine  100 mg Oral Daily   Or  . thiamine  100 mg Intravenous Daily   Continuous Infusions: . sodium chloride 75 mL/hr at 11/15/19 0500  . piperacillin-tazobactam (ZOSYN)  IV 3.375 g (11/16/19 0630)     LOS: 2 days    Time spent: 35 mins,More than 50% of that time was spent in counseling and/or coordination of care.      Burnadette Pop, MD [facility] P8/13/2021, 8:13 AM

## 2019-11-17 LAB — CBC WITH DIFFERENTIAL/PLATELET
Abs Immature Granulocytes: 0.03 10*3/uL (ref 0.00–0.07)
Basophils Absolute: 0.1 10*3/uL (ref 0.0–0.1)
Basophils Relative: 1 %
Eosinophils Absolute: 0 10*3/uL (ref 0.0–0.5)
Eosinophils Relative: 0 %
HCT: 32.5 % — ABNORMAL LOW (ref 39.0–52.0)
Hemoglobin: 10.2 g/dL — ABNORMAL LOW (ref 13.0–17.0)
Immature Granulocytes: 1 %
Lymphocytes Relative: 15 %
Lymphs Abs: 0.9 10*3/uL (ref 0.7–4.0)
MCH: 29.6 pg (ref 26.0–34.0)
MCHC: 31.4 g/dL (ref 30.0–36.0)
MCV: 94.2 fL (ref 80.0–100.0)
Monocytes Absolute: 0.6 10*3/uL (ref 0.1–1.0)
Monocytes Relative: 10 %
Neutro Abs: 4.5 10*3/uL (ref 1.7–7.7)
Neutrophils Relative %: 73 %
Platelets: 262 10*3/uL (ref 150–400)
RBC: 3.45 MIL/uL — ABNORMAL LOW (ref 4.22–5.81)
RDW: 13.4 % (ref 11.5–15.5)
WBC: 6.1 10*3/uL (ref 4.0–10.5)
nRBC: 0 % (ref 0.0–0.2)

## 2019-11-17 LAB — BASIC METABOLIC PANEL
Anion gap: 8 (ref 5–15)
BUN: 20 mg/dL (ref 8–23)
CO2: 20 mmol/L — ABNORMAL LOW (ref 22–32)
Calcium: 8.2 mg/dL — ABNORMAL LOW (ref 8.9–10.3)
Chloride: 102 mmol/L (ref 98–111)
Creatinine, Ser: 1.73 mg/dL — ABNORMAL HIGH (ref 0.61–1.24)
GFR calc Af Amer: 44 mL/min — ABNORMAL LOW (ref >60)
GFR calc non Af Amer: 38 mL/min — ABNORMAL LOW (ref >60)
Glucose, Bld: 129 mg/dL — ABNORMAL HIGH (ref 70–99)
Potassium: 3.6 mmol/L (ref 3.5–5.1)
Sodium: 130 mmol/L — ABNORMAL LOW (ref 135–145)

## 2019-11-17 MED ORDER — AMOXICILLIN-POT CLAVULANATE 500-125 MG PO TABS
1.0000 | ORAL_TABLET | Freq: Two times a day (BID) | ORAL | 0 refills | Status: AC
Start: 2019-11-17 — End: 2019-11-22

## 2019-11-17 NOTE — Discharge Summary (Signed)
Physician Discharge Summary  Torrey Ballinas RUE:[id number] DOB: 17-Jul-1945 DOA: 11/14/2019  PCP: Johny Blamer, MD  Admit date: 11/14/2019 Discharge date: 11/17/2019  Admitted From: Home Disposition:  Home  Discharge Condition:Stable CODE STATUS:FULL Diet recommendation:Regular   Brief/Interim Summary:  Patient is a 74 year old male with history of dementia, abdominal aortic aneurysm status post repair, CKD stage IIIb who presents with complaint of syncopal.  CT head did not show any acute intracranial abnormalities.  Patient reported seeing gas bubbles in the urine.  Hemodynamically stable on presentation.  CT abdomen/pelvis concerning for colovesical fistula, severe sigmoid diverticulosis.  He had leukocytosis on presentation which has resolved.  General surgery/GI  consulted.  Started on Zosyn.  He remains hemodynamically stable.  Cultures did not show any growth. Plan is repeat colonoscopy and possible surgery as an outpatient.    He is medically stable for discharge home today with oral antibiotics.  Following problems were addressed during his hospitalization:  Syncope: Most likely hypovolemic/orthostatic syncope for diarrhea .echocardiogram showed ejection fraction 50 to 55%, no regional wall motion abnormality, no valvular abnormalities.  Troponins negative. Negative orthostatic vitals.  Monitor on telemetry.  Colovesical fistula: As seen on the CT. showed moderate gas within the bladder lumen/intramural gas.  Etiology could be from remote diverticulitis.  Patient reported seeing gas bubbles in the urine.  General surgery consulted.  Started on zosyn here.  General surgery and GI recommending outpatient follow-up for colonoscopy and possible surgery. We will change antibiotics to Augmentin on discharge.  His leukocytosis has improved . Diet advanced to regular  Diarrhea: Reports diarrhea for the last 6 months.  Follows with Dr. Ewing Schlein.  Recently taking antibiotic at home.  Negative C.  difficile PCR and negative GI pathogen panel. Diarrhea is better today.  Continue Imodium as needed  UTI: Currently afebrile.  Urine culture did not show any significant growth.  Lactic acidosis: resolved  CKD stage IIIb: Creatinine trended up mildly.  Baseline creatinine around 1.4.Check Bmp in a week  Dementia: On donepezil.  Continue supportive care.    PT/deconditioning/generalized weakness: PT/OT did not recommend  follow-up.   Discharge Diagnoses:  Active Problems:   Colovesical fistula    Discharge Instructions  Discharge Instructions    Diet general   Complete by: As directed    Discharge instructions   Complete by: As directed    1)Please take prescribed medications as instructed. 2)Follow up with your gastroenterologist and general surgery as an outpatient. 3)Follow up with your PCP in a week.  Do a BMP test during the follow-up to check your kidney fnction   Increase activity slowly   Complete by: As directed      Allergies as of 11/17/2019   No Known Allergies     Medication List    STOP taking these medications   nitrofurantoin (macrocrystal-monohydrate) 100 MG capsule Commonly known as: MACROBID     TAKE these medications   acetaminophen 325 MG tablet Commonly known as: TYLENOL Take 2 tablets (650 mg total) by mouth every 4 (four) hours as needed for mild pain, fever or headache (Do not take more than 4000 mg of Tylenol (acetaminophen) daily.  This is in your prescription pain medicine.).   amoxicillin-clavulanate 500-125 MG tablet Commonly known as: Augmentin Take 1 tablet (500 mg total) by mouth in the morning and at bedtime for 5 days.   aspirin EC 81 MG tablet Take 81 mg by mouth daily.   diphenoxylate-atropine 2.5-0.025 MG tablet Commonly known as: LOMOTIL Take 1 tablet  by mouth 4 (four) times daily as needed for diarrhea or loose stools.   donepezil 10 MG tablet Commonly known as: ARICEPT Take 10 mg by mouth daily.   loperamide  2 MG tablet Commonly known as: IMODIUM A-D Take 1 mg by mouth as needed for diarrhea or loose stools.       Follow-up Information    Romie Levee, MD Follow up.   Specialty: General Surgery Why: Please call and set up an appointment as soon as your colonoscopy is scheduled. Contact information: [address] [place] [address] [phone]        Johny Blamer, MD. Schedule an appointment as soon as possible for a visit in 1 week(s).   Specialty: Family Medicine Contact information: [address] [place] [address] [phone]              No Known Allergies  Consultations:  General surgery,GI   Procedures/Studies: CT Head Wo Contrast  Result Date: 11/14/2019 CLINICAL DATA:  Head trauma, moderate/severe, hit head. Additional provided: Patient treated for UTI for 1 week, weakness today, diarrhea, fell backwards due to syncope. EXAM: CT HEAD WITHOUT CONTRAST TECHNIQUE: Contiguous axial images were obtained from the base of the skull through the vertex without intravenous contrast. COMPARISON:  Brain MRI 01/16/2019 FINDINGS: Brain: Stable, moderate generalized parenchymal atrophy Redemonstrated chronic cortical/subcortical infarct within the right frontal lobe. Stable background mild multifocal hypoattenuation within the cerebral white matter which is nonspecific, but consistent with chronic small vessel ischemic disease. There is no acute intracranial hemorrhage. No demarcated cortical infarct. No extra-axial fluid collection. No evidence of intracranial mass. No midline shift. Vascular: No hyperdense vessel. Skull: Normal. Negative for fracture or focal lesion. Sinuses/Orbits: Visualized orbits show no acute finding. No significant paranasal sinus disease or mastoid effusion at the imaged levels. Other: Left parietal scalp hematoma. IMPRESSION: No evidence of acute intracranial abnormality. Left parietal scalp hematoma. Redemonstrated chronic  cortical/subcortical right frontal lobe infarct. Stable background moderate generalized parenchymal atrophy and mild chronic small vessel ischemic disease. Electronically Signed   By: Jackey Loge DO   On: 11/14/2019 16:37   CT Chest Wo Contrast  Result Date: 11/14/2019 CLINICAL DATA:  Leukocytosis, nausea, vomiting, pancreatitis EXAM: CT CHEST WITHOUT CONTRAST AND CT ABDOMEN AND PELVIS WITH CONTRAST TECHNIQUE: Multidetector CT imaging of the chest, abdomen and pelvis was performed. Initial CT imaging of the chest was performed without contrast administration. Subsequently, CT imaging of the abdomen and pelvis was performed utilizing 80 cc of Omnipaque 300 intravenous iodinated contrast. COMPARISON:  12/18/2013 FINDINGS: CT CHEST FINDINGS Cardiovascular: Extensive multi-vessel coronary artery calcification. Global cardiac size within normal limits. No pericardial effusion. Central pulmonary arteries are enlarged suggesting changes of pulmonary arterial hypertension. Mild atherosclerotic calcification is seen within the thoracic aorta without evidence of aneurysm. Mediastinum/Nodes: No enlarged mediastinal, hilar, or axillary lymph nodes. Thyroid gland, trachea, and esophagus demonstrate no significant findings. Lungs/Pleura: Moderate centrilobular emphysema. Mild right basilar atelectasis. No focal pulmonary nodule or infiltrate. No pneumothorax or pleural effusion. Central airways are widely pa tiny cortical cyst within the upper pole of the left kidney. No hydronephrosis. No intrarenal or ureteral calculi. Gas within the bladder lumen may relate to catheterization, tent. Musculoskeletal: No chest wall mass or suspicious bone lesions identified. CT ABDOMEN PELVIS FINDINGS Hepatobiliary: Liver unremarkable. Gallbladder unremarkable. No intra or extrahepatic biliary ductal dilation. Pancreas: The pancreas is unremarkable. Specifically, there is normal pancreatic parenchymal enhancement. No pancreatic ductal  dilation. No pancreatic calcifications  are identified. No peripancreatic inflammatory stranding or peripancreatic fluid collections are identified. Spleen: Capsular calcifications involving the lateral splenic capsule may relate to remote trauma. The spleen is otherwise unremarkable. Adrenals/Urinary Tract: Adrenal glands are unremarkable. Kidneys are normal in size and position. Simple cortical cyst noted within the upper pole of the left kidney. No hydronephrosis. No intrarenal or ureteral calculi. There is moderate gas noted within the bladder lumen. Additionally, there is focal thickening of the bladder wall and intramural gas identified, best noted on axial image # 66/2 and sagittal image # 67/6 suspicious for a colovesicular fistula, possibly result of remote diverticulitis. Stomach/Bowel: There is severe sigmoid diverticulosis without superimposed inflammatory change. Scattered diverticular are seen within the descending colon. Appendectomy has been performed. Stomach and small bowel are unremarkable. No free intraperitoneal gas or fluid. Vascular/Lymphatic: Endovascular repair of an infrarenal abdominal aortic aneurysm utilizing a bifurcated stent graft has been performed there is progressive aneurysmal dilation of the juxtarenal abdominal aorta, measuring 3.6 x 3.3 cm just above the proximal landing zone of the stent graft, previously measuring 3.0 x 2.8 cm. This is best seen on axial image # 26/2 and sagittal image # 61/6. The infrarenal abdominal aortic aneurysm has decreased in maximal caliber measuring 3.7 cm at axial image # 34/2, previously measuring 4.3 cm. Moderate plaque within the lower extremity arterial inflow bilaterally. No pathologic adenopathy within the abdomen and pelvis. Reproductive: Mild prostatic enlargement. Seminal vesicles are unremarkable. Other: Rectum unremarkable. Prominent superficial venous varicosity identified within the anterior visualized right thigh. Musculoskeletal: No  acute bone abnormality. IMPRESSION: 1. There is moderate gas within the bladder lumen, with focal thickening of the bladder wall and intramural gas identified. Findings are suspicious for a colovesicular fistula, possibly result of remote diverticulitis. Correlation with urinalysis is recommended. 2. Endovascular repair of an infrarenal abdominal aortic aneurysm utilizing a bifurcated stent graft. There is progressive aneurysmal dilation of the juxtarenal abdominal aorta, measuring 3.6 x 3.3 cm, previously measuring 3.0 x 2.8 cm. The infrarenal abdominal aortic aneurysm has decreased in maximal caliber measuring 3.7 cm, previously measuring 4.3 cm. 3. Severe sigmoid diverticulosis without superimposed inflammatory change. 4. Moderate centrilobular emphysema. 5. Enlarged central pulmonary arteries suggesting changes of pulmonary arterial hypertension. 6. No CT evidence of acute or chronic pancreatitis. Aortic Atherosclerosis (ICD10-I70.0) and Emphysema (ICD10-J43.9). Electronically Signed   By: Helyn Numbers MD   On: 11/14/2019 18:08   US Abdomen Complete  Result Date: 10/31/2019 CLINICAL DATA:  Abdominal distress and weight loss for 1 month, history hypertension EXAM: ABDOMEN ULTRASOUND COMPLETE COMPARISON:  None FINDINGS: Gallbladder: Normally distended without stones or wall thickening. No pericholecystic fluid or sonographic Murphy sign. Common bile duct: Diameter: 3 mm, normal Liver: Upper normal echogenicity without mass or nodularity. No intrahepatic biliary dilatation. Portal vein is patent on color Doppler imaging with normal direction of blood flow towards the liver. IVC: Normal appearance Pancreas: Normal appearance Spleen: Normal appearance, 9.0 cm length Right Kidney: Length: 10.7 cm. Normal cortical thickness. Upper normal cortical echogenicity without mass or hydronephrosis. Left Kidney: Length: 9.8 cm. Normal cortical thickness. Upper normal cortical echogenicity without hydronephrosis. Two small  cysts at upper pole, both 11 mm greatest diameter. Abdominal aorta: Fusiform aneurysmal dilatation of distal abdominal aorta 3.1 x 3.2 cm greatest size. Mild dilatation of the common iliac arteries 11 mm RIGHT and 12 mm LEFT. Other findings: No free fluid IMPRESSION: Fusiform aneurysmal dilatation of distal abdominal aorta maximum 3.2 cm diameter. Recommend followup by [place] in 3 years. This  recommendation follows ACR consensus guidelines: White Paper of the ACR Incidental Findings Committee II on Vascular Findings. J Am Coll Radiol 2013; 10:789-794 Remainder of exam unremarkable. Electronically Signed   By: Ulyses SouthwardMark  Boles M.D.   On: 10/31/2019 15:15   CT ABDOMEN PELVIS W CONTRAST  Result Date: 11/14/2019 CLINICAL DATA:  Leukocytosis, nausea, vomiting, pancreatitis EXAM: CT CHEST WITHOUT CONTRAST AND CT ABDOMEN AND PELVIS WITH CONTRAST TECHNIQUE: Multidetector CT imaging of the chest, abdomen and pelvis was performed. Initial CT imaging of the chest was performed without contrast administration. Subsequently, CT imaging of the abdomen and pelvis was performed utilizing 80 cc of Omnipaque 300 intravenous iodinated contrast. COMPARISON:  12/18/2013 FINDINGS: CT CHEST FINDINGS Cardiovascular: Extensive multi-vessel coronary artery calcification. Global cardiac size within normal limits. No pericardial effusion. Central pulmonary arteries are enlarged suggesting changes of pulmonary arterial hypertension. Mild atherosclerotic calcification is seen within the thoracic aorta without evidence of aneurysm. Mediastinum/Nodes: No enlarged mediastinal, hilar, or axillary lymph nodes. Thyroid gland, trachea, and esophagus demonstrate no significant findings. Lungs/Pleura: Moderate centrilobular emphysema. Mild right basilar atelectasis. No focal pulmonary nodule or infiltrate. No pneumothorax or pleural effusion. Central airways are widely pa tiny cortical cyst within the upper pole of the left kidney. No hydronephrosis. No  intrarenal or ureteral calculi. Gas within the bladder lumen may relate to catheterization, tent. Musculoskeletal: No chest wall mass or suspicious bone lesions identified. CT ABDOMEN PELVIS FINDINGS Hepatobiliary: Liver unremarkable. Gallbladder unremarkable. No intra or extrahepatic biliary ductal dilation. Pancreas: The pancreas is unremarkable. Specifically, there is normal pancreatic parenchymal enhancement. No pancreatic ductal dilation. No pancreatic calcifications are identified. No peripancreatic inflammatory stranding or peripancreatic fluid collections are identified. Spleen: Capsular calcifications involving the lateral splenic capsule may relate to remote trauma. The spleen is otherwise unremarkable. Adrenals/Urinary Tract: Adrenal glands are unremarkable. Kidneys are normal in size and position. Simple cortical cyst noted within the upper pole of the left kidney. No hydronephrosis. No intrarenal or ureteral calculi. There is moderate gas noted within the bladder lumen. Additionally, there is focal thickening of the bladder wall and intramural gas identified, best noted on axial image # 66/2 and sagittal image # 67/6 suspicious for a colovesicular fistula, possibly result of remote diverticulitis. Stomach/Bowel: There is severe sigmoid diverticulosis without superimposed inflammatory change. Scattered diverticular are seen within the descending colon. Appendectomy has been performed. Stomach and small bowel are unremarkable. No free intraperitoneal gas or fluid. Vascular/Lymphatic: Endovascular repair of an infrarenal abdominal aortic aneurysm utilizing a bifurcated stent graft has been performed there is progressive aneurysmal dilation of the juxtarenal abdominal aorta, measuring 3.6 x 3.3 cm just above the proximal landing zone of the stent graft, previously measuring 3.0 x 2.8 cm. This is best seen on axial image # 26/2 and sagittal image # 61/6. The infrarenal abdominal aortic aneurysm has decreased  in maximal caliber measuring 3.7 cm at axial image # 34/2, previously measuring 4.3 cm. Moderate plaque within the lower extremity arterial inflow bilaterally. No pathologic adenopathy within the abdomen and pelvis. Reproductive: Mild prostatic enlargement. Seminal vesicles are unremarkable. Other: Rectum unremarkable. Prominent superficial venous varicosity identified within the anterior visualized right thigh. Musculoskeletal: No acute bone abnormality. IMPRESSION: 1. There is moderate gas within the bladder lumen, with focal thickening of the bladder wall and intramural gas identified. Findings are suspicious for a colovesicular fistula, possibly result of remote diverticulitis. Correlation with urinalysis is recommended. 2. Endovascular repair of an infrarenal abdominal aortic aneurysm utilizing a bifurcated stent graft. There is progressive aneurysmal  dilation of the juxtarenal abdominal aorta, measuring 3.6 x 3.3 cm, previously measuring 3.0 x 2.8 cm. The infrarenal abdominal aortic aneurysm has decreased in maximal caliber measuring 3.7 cm, previously measuring 4.3 cm. 3. Severe sigmoid diverticulosis without superimposed inflammatory change. 4. Moderate centrilobular emphysema. 5. Enlarged central pulmonary arteries suggesting changes of pulmonary arterial hypertension. 6. No CT evidence of acute or chronic pancreatitis. Aortic Atherosclerosis (ICD10-I70.0) and Emphysema (ICD10-J43.9). Electronically Signed   By: Helyn Numbers MD   On: 11/14/2019 18:08   DG Chest Portable 1 View  Result Date: 11/14/2019 CLINICAL DATA:  Syncope, cough, fatigue EXAM: PORTABLE CHEST 1 VIEW COMPARISON:  10/29/2016 FINDINGS: The lungs are symmetrically hyperinflated in keeping with changes of underlying COPD. No superimposed focal pulmonary infiltrate. Linear lucencies at the right apex in along the right lateral mid lung zone likely represent skin folds, however, a small apical pneumothorax is difficult to exclude on this  examination. No pneumothorax on the left. No pleural effusion. Cardiac size within normal limits. Pulmonary vascularity normal. No acute bone abnormality. IMPRESSION: 1. Findings of COPD. 2. Linear lucencies at the right apex and along the right lateral mid lung zone likely represent skin folds, however, a small apical pneumothorax is difficult to exclude on this examination. A standard two view chest radiograph in the expiratory phase or a left lateral decubitus chest radiograph would be helpful in excluding a a pneumothorax. Electronically Signed   By: Helyn Numbers MD   On: 11/14/2019 16:34   ECHOCARDIOGRAM COMPLETE  Result Date: 11/15/2019    ECHOCARDIOGRAM REPORT   Patient Name:   STOKELY JEANCHARLES Date of Exam: 11/15/2019 Medical Rec #:  [id number]  Height:       67.0 in Accession #:    [id number] Weight:       153.0 lb Date of Birth:  1946-01-27   BSA:          1.805 m Patient Age:    74 years   BP:           112/50 mmHg Patient Gender: M          HR:           67 bpm. Exam Location:  Inpatient Procedure: 2D Echo Indications:    Syncope R55  History:        Patient has no prior history of Echocardiogram examinations.                 Risk Factors:Hypertension and Dyslipidemia.  Sonographer:    Thurman Coyer RDCS (AE) Referring Phys: [id number] ASIA B ZIERLE-GHOSH IMPRESSIONS  1. Left ventricular ejection fraction, by estimation, is 50 to 55%. The left ventricle has low normal function. The left ventricle has no regional wall motion abnormalities. Left ventricular diastolic parameters were normal.  2. Right ventricular systolic function is normal. The right ventricular size is normal. There is normal pulmonary artery systolic pressure.  3. The mitral valve is grossly normal. No evidence of mitral valve regurgitation.  4. The aortic valve is tricuspid. Aortic valve regurgitation is not visualized. FINDINGS  Left Ventricle: Left ventricular ejection fraction, by estimation, is 50 to 55%. The left ventricle has low  normal function. The left ventricle has no regional wall motion abnormalities. The left ventricular internal cavity size was normal in size. There is no left ventricular hypertrophy. Left ventricular diastolic parameters were normal. Right Ventricle: The right ventricular size is normal. No increase in right ventricular wall thickness. Right ventricular systolic function is normal. There is  normal pulmonary artery systolic pressure. The tricuspid regurgitant velocity is 1.74 m/s, and  with an assumed right atrial pressure of 3 mmHg, the estimated right ventricular systolic pressure is 15.1 mmHg. Left Atrium: Left atrial size was normal in size. Right Atrium: Right atrial size was normal in size. Pericardium: There is no evidence of pericardial effusion. Mitral Valve: The mitral valve is grossly normal. No evidence of mitral valve regurgitation. Tricuspid Valve: The tricuspid valve is grossly normal. Tricuspid valve regurgitation is trivial. No evidence of tricuspid stenosis. Aortic Valve: The aortic valve is tricuspid. . There is mild thickening and mild calcification of the aortic valve. Aortic valve regurgitation is not visualized. There is mild thickening of the aortic valve. There is mild calcification of the aortic valve. Pulmonic Valve: The pulmonic valve was grossly normal. Pulmonic valve regurgitation is not visualized. Aorta: The aortic root and ascending aorta are structurally normal, with no evidence of dilitation. IAS/Shunts: The atrial septum is grossly normal.  LEFT VENTRICLE PLAX 2D LVIDd:         3.60 cm  Diastology LVIDs:         2.60 cm  LV e' lateral:   10.10 cm/s LV PW:         1.00 cm  LV E/e' lateral: 8.5 LV IVS:        1.00 cm  LV e' medial:    7.29 cm/s LVOT diam:     2.50 cm  LV E/e' medial:  11.8 LV SV:         97 LV SV Index:   54 LVOT Area:     4.91 cm  RIGHT VENTRICLE RV S prime:     14.50 cm/s TAPSE (M-mode): 2.0 cm LEFT ATRIUM             Index       RIGHT ATRIUM           Index LA  diam:        3.30 cm 1.83 cm/m  RA Area:     12.80 cm LA Vol (A2C):   42.4 ml 23.50 ml/m RA Volume:   27.00 ml  14.96 ml/m LA Vol (A4C):   35.7 ml 19.78 ml/m LA Biplane Vol: 39.7 ml 22.00 ml/m  AORTIC VALVE LVOT Vmax:   94.00 cm/s LVOT Vmean:  62.900 cm/s LVOT VTI:    0.198 m  AORTA Ao Root diam: 3.20 cm MITRAL VALVE               TRICUSPID VALVE MV Area (PHT): 3.39 cm    TR Peak grad:   12.1 mmHg MV Decel Time: 224 msec    TR Vmax:        174.00 cm/s MV E velocity: 86.10 cm/s MV A velocity: 83.60 cm/s  SHUNTS MV E/A ratio:  1.03        Systemic VTI:  0.20 m                            Systemic Diam: 2.50 cm Kristeen Miss MD Electronically signed by Kristeen Miss MD Signature Date/Time: 11/15/2019/11:33:16 AM    Final        Subjective: Patient seen and examined the bedside this morning.  Medically stable for discharge home today.  Discharge Exam: Vitals:   11/16/19 2050 11/17/19 0559  BP: 131/68 139/66  Pulse: 73 66  Resp: 18 16  Temp: 99.1 F (37.3 C) 98.4 F (36.9 C)  SpO2: 96% 95%   Vitals:   11/16/19 0435 11/16/19 1349 11/16/19 2050 11/17/19 0559  BP: 133/67 137/70 131/68 139/66  Pulse: 78 76 73 66  Resp: 20 19 18 16   Temp: 98.8 F (37.1 C) 98.2 F (36.8 C) 99.1 F (37.3 C) 98.4 F (36.9 C)  TempSrc:  Oral    SpO2: 92% 94% 96% 95%  Weight:      Height:        General: Pt is alert, awake, not in acute distress Cardiovascular: RRR, S1/S2 +, no rubs, no gallops Respiratory: CTA bilaterally, no wheezing, no rhonchi Abdominal: Soft, NT, ND, bowel sounds + Extremities: no edema, no cyanosis    The results of significant diagnostics from this hospitalization (including imaging, microbiology, ancillary and laboratory) are listed below for reference.     Microbiology: Recent Results (from the past 240 hour(s))  Urine culture     Status: Abnormal   Collection Time: 11/14/19  3:36 PM   Specimen: Urine, Random  Result Value Ref Range Status   Specimen Description    Final    URINE, RANDOM Performed at [facility], [address] [place], [place]    Special Requests   Final    NONE Performed at [facility], [address] [place], [place]    Culture (A)  Final    <10,000 COLONIES/mL INSIGNIFICANT GROWTH Performed at [facility], [address] [place], [place]    Report Status 11/15/2019 FINAL  Final  SARS Coronavirus 2 by RT PCR (hospital order, performed in [facility] hospital lab) Nasopharyngeal Nasopharyngeal Swab     Status: None   Collection Time: 11/14/19  3:36 PM   Specimen: Nasopharyngeal Swab  Result Value Ref Range Status   SARS Coronavirus 2 NEGATIVE NEGATIVE Final    Comment: (NOTE) SARS-CoV-2 target nucleic acids are NOT DETECTED.  The SARS-CoV-2 RNA is generally detectable in upper and lower respiratory specimens during the acute phase of infection. The lowest concentration of SARS-CoV-2 viral copies this assay can detect is 250 copies / mL. A negative result does not preclude SARS-CoV-2 infection and should not be used as the sole basis for treatment or other patient management decisions.  A negative result may occur with improper specimen collection / handling, submission of specimen other than nasopharyngeal swab, presence of viral mutation(s) within the areas targeted by this assay, and inadequate number of viral copies (<250 copies / mL). A negative result must be combined with clinical observations, patient history, and epidemiological information.  Fact Sheet for Patients:   01/14/20  Fact Sheet for Healthcare Providers: BoilerBrush.com.cy  This test is not yet approved or  cleared by the https://pope.com/ FDA and has been authorized for detection and/or diagnosis of SARS-CoV-2 by FDA under an Emergency Use Authorization (EUA).  This EUA will remain in effect (meaning this test can be used) for the  duration of the COVID-19 declaration under Section 564(b)(1) of the Act, 21 U.S.C. section 360bbb-3(b)(1), unless the authorization is terminated or revoked sooner.  Performed at [facility], [address] [place], [place]   C Difficile Quick Screen w PCR reflex     Status: Abnormal   Collection Time: 11/15/19 10:16 AM   Specimen: STOOL  Result Value Ref Range Status   C Diff antigen POSITIVE (A) NEGATIVE Final   C Diff toxin NEGATIVE NEGATIVE Final   C Diff interpretation Results are  indeterminate. See PCR results.  Final    Comment: Performed at [facility], [address] [place], [place] [address]  Gastrointestinal Panel by PCR , Stool     Status: None   Collection Time: 11/15/19 10:16 AM   Specimen: STOOL  Result Value Ref Range Status   Campylobacter species NOT DETECTED NOT DETECTED Final   Plesimonas shigelloides NOT DETECTED NOT DETECTED Final   Salmonella species NOT DETECTED NOT DETECTED Final   Yersinia enterocolitica NOT DETECTED NOT DETECTED Final   Vibrio species NOT DETECTED NOT DETECTED Final   Vibrio cholerae NOT DETECTED NOT DETECTED Final   Enteroaggregative E coli (EAEC) NOT DETECTED NOT DETECTED Final   Enteropathogenic E coli (EPEC) NOT DETECTED NOT DETECTED Final   Enterotoxigenic E coli (ETEC) NOT DETECTED NOT DETECTED Final   Shiga like toxin producing E coli (STEC) NOT DETECTED NOT DETECTED Final   Shigella/Enteroinvasive E coli (EIEC) NOT DETECTED NOT DETECTED Final   Cryptosporidium NOT DETECTED NOT DETECTED Final   Cyclospora cayetanensis NOT DETECTED NOT DETECTED Final   Entamoeba histolytica NOT DETECTED NOT DETECTED Final   Giardia lamblia NOT DETECTED NOT DETECTED Final   Adenovirus F40/41 NOT DETECTED NOT DETECTED Final   Astrovirus NOT DETECTED NOT DETECTED Final   Norovirus GI/GII NOT DETECTED NOT DETECTED Final   Rotavirus A NOT DETECTED NOT DETECTED Final   Sapovirus (I, II, IV, and V) NOT  DETECTED NOT DETECTED Final    Comment: Performed at [facility], [address] [place], [place] [address]  C. Diff by PCR, Reflexed     Status: None   Collection Time: 11/15/19 10:16 AM  Result Value Ref Range Status   Toxigenic C. Difficile by PCR NEGATIVE NEGATIVE Final    Comment: Patient is colonized with non toxigenic C. difficile. May not need treatment unless significant symptoms are present. Performed at [facility], [address], [place], [place] [address]      Labs: BNP (last 3 results) No results for input(s): BNP in the last 8760 hours. Basic Metabolic Panel: Recent Labs  Lab 11/14/19 1536 11/15/19 0130 11/16/19 0407 11/17/19 0327  NA 131* 133* 137 130*  K 4.3 4.4 3.9 3.6  CL 100 105 106 102  CO2 21* 20* 23 20*  GLUCOSE 132* 150* 119* 129*  BUN 29* 27* 23 20  CREATININE 1.41* 1.57* 1.60* 1.73*  CALCIUM 9.0 8.3* 8.6* 8.2*  MG 1.9 2.0  --   --    Liver Function Tests: Recent Labs  Lab 11/14/19 1536 11/15/19 0130  AST 23 17  ALT 26 23  ALKPHOS 60 48  BILITOT 0.5 1.1  PROT 7.7 6.4*  ALBUMIN 4.4 3.5   Recent Labs  Lab 11/14/19 1536 11/15/19 0130  LIPASE 255* 53*   No results for input(s): AMMONIA in the last 168 hours. CBC: Recent Labs  Lab 11/14/19 1536 11/16/19 0407 11/17/19 0327  WBC 21.2* 11.7* 6.1  NEUTROABS 18.9* 9.6* 4.5  HGB 12.5* 10.6* 10.2*  HCT 39.5 33.8* 32.5*  MCV 94.5 95.2 94.2  PLT 340 253 262   Cardiac Enzymes: No results for input(s): CKTOTAL, CKMB, CKMBINDEX, TROPONINI in the last 168 hours. BNP: Invalid input(s): POCBNP CBG: No results for input(s): GLUCAP in the last 168 hours. D-Dimer No results for input(s): DDIMER in the last 72 hours. Hgb A1c No results for input(s): HGBA1C in the last 72 hours. Lipid Profile No results for input(s): CHOL, HDL, LDLCALC, TRIG, CHOLHDL, LDLDIRECT in the  last 72 hours. Thyroid function studies Recent Labs    11/14/19 1536  TSH 1.526   Anemia work up No  results for input(s): VITAMINB12, FOLATE, FERRITIN, TIBC, IRON, RETICCTPCT in the last 72 hours. Urinalysis    Component Value Date/Time   COLORURINE YELLOW 11/14/2019 1536   APPEARANCEUR CLEAR 11/14/2019 1536   LABSPEC 1.025 11/14/2019 1536   PHURINE 5.5 11/14/2019 1536   GLUCOSEU NEGATIVE 11/14/2019 1536   HGBUR TRACE (A) 11/14/2019 1536   BILIRUBINUR NEGATIVE 11/14/2019 1536   KETONESUR NEGATIVE 11/14/2019 1536   PROTEINUR 30 (A) 11/14/2019 1536   UROBILINOGEN 0.2 11/01/2011 1522   NITRITE NEGATIVE 11/14/2019 1536   LEUKOCYTESUR SMALL (A) 11/14/2019 1536   Sepsis Labs Invalid input(s): PROCALCITONIN,  WBC,  LACTICIDVEN Microbiology Recent Results (from the past 240 hour(s))  Urine culture     Status: Abnormal   Collection Time: 11/14/19  3:36 PM   Specimen: Urine, Random  Result Value Ref Range Status   Specimen Description   Final    URINE, RANDOM Performed at [facility], [address] [place], [place] [address]    Special Requests   Final    NONE Performed at [facility], [address] [place], [place] [address]    Culture (A)  Final    <10,000 COLONIES/mL INSIGNIFICANT GROWTH Performed at [facility], [address], [place], [place] [address]    Report Status 11/15/2019 FINAL  Final  SARS Coronavirus 2 by RT PCR (hospital order, performed in [facility] hospital lab) Nasopharyngeal Nasopharyngeal Swab     Status: None   Collection Time: 11/14/19  3:36 PM   Specimen: Nasopharyngeal Swab  Result Value Ref Range Status   SARS Coronavirus 2 NEGATIVE NEGATIVE Final    Comment: (NOTE) SARS-CoV-2 target nucleic acids are NOT DETECTED.  The SARS-CoV-2 RNA is generally detectable in upper and lower respiratory specimens during the acute phase of infection. The lowest concentration of SARS-CoV-2 viral copies this assay can detect is 250 copies / mL. A negative result does not preclude SARS-CoV-2 infection and should not be used as the  sole basis for treatment or other patient management decisions.  A negative result may occur with improper specimen collection / handling, submission of specimen other than nasopharyngeal swab, presence of viral mutation(s) within the areas targeted by this assay, and inadequate number of viral copies (<250 copies / mL). A negative result must be combined with clinical observations, patient history, and epidemiological information.  Fact Sheet for Patients:   BoilerBrush.com.cy  Fact Sheet for Healthcare Providers: https://pope.com/  This test is not yet approved or  cleared by the [place] FDA and has been authorized for detection and/or diagnosis of SARS-CoV-2 by FDA under an Emergency Use Authorization (EUA).  This EUA will remain in effect (meaning this test can be used) for the duration of the COVID-19 declaration under Section 564(b)(1) of the Act, 21 U.S.C. section 360bbb-3(b)(1), unless the authorization is terminated or revoked sooner.  Performed at [facility], [address] [place], [place] [address]   C Difficile Quick Screen w PCR reflex     Status: Abnormal   Collection Time: 11/15/19 10:16 AM   Specimen: STOOL  Result Value Ref Range Status   C Diff antigen POSITIVE (A) NEGATIVE Final   C Diff toxin NEGATIVE NEGATIVE Final   C Diff interpretation Results are indeterminate. See PCR results.  Final    Comment: Performed at  [facility], [address], [place], [place] [address]  Gastrointestinal Panel by PCR , Stool     Status: None   Collection Time: 11/15/19 10:16 AM   Specimen: STOOL  Result Value Ref Range Status   Campylobacter species NOT DETECTED NOT DETECTED Final   Plesimonas shigelloides NOT DETECTED NOT DETECTED Final   Salmonella species NOT DETECTED NOT DETECTED Final   Yersinia enterocolitica NOT DETECTED NOT DETECTED Final   Vibrio species NOT DETECTED NOT  DETECTED Final   Vibrio cholerae NOT DETECTED NOT DETECTED Final   Enteroaggregative E coli (EAEC) NOT DETECTED NOT DETECTED Final   Enteropathogenic E coli (EPEC) NOT DETECTED NOT DETECTED Final   Enterotoxigenic E coli (ETEC) NOT DETECTED NOT DETECTED Final   Shiga like toxin producing E coli (STEC) NOT DETECTED NOT DETECTED Final   Shigella/Enteroinvasive E coli (EIEC) NOT DETECTED NOT DETECTED Final   Cryptosporidium NOT DETECTED NOT DETECTED Final   Cyclospora cayetanensis NOT DETECTED NOT DETECTED Final   Entamoeba histolytica NOT DETECTED NOT DETECTED Final   Giardia lamblia NOT DETECTED NOT DETECTED Final   Adenovirus F40/41 NOT DETECTED NOT DETECTED Final   Astrovirus NOT DETECTED NOT DETECTED Final   Norovirus GI/GII NOT DETECTED NOT DETECTED Final   Rotavirus A NOT DETECTED NOT DETECTED Final   Sapovirus (I, II, IV, and V) NOT DETECTED NOT DETECTED Final    Comment: Performed at [facility], [address] [place], [place] [address]  C. Diff by PCR, Reflexed     Status: None   Collection Time: 11/15/19 10:16 AM  Result Value Ref Range Status   Toxigenic C. Difficile by PCR NEGATIVE NEGATIVE Final    Comment: Patient is colonized with non toxigenic C. difficile. May not need treatment unless significant symptoms are present. Performed at [facility], [address] [place], [place] [address]     Please note: You were cared for by a hospitalist during your hospital stay. Once you are discharged, your primary care physician will handle any further medical issues. Please note that NO REFILLS for any discharge medications will be authorized once you are discharged, as it is imperative that you return to your primary care physician (or establish a relationship with a primary care physician if you do not have one) for your post hospital discharge needs so that they can reassess your need for medications and monitor your lab values.    Time coordinating  discharge: 40 minutes  SIGNED:   Burnadette Pop, MD  [facility] 11/17/2019, 10:19 AM Pager [phone]  If 7PM-7AM, please contact night-coverage [email or web address] Password [email or web address]

## 2019-11-17 NOTE — Discharge Instructions (Signed)
Near-Syncope Near-syncope is when you suddenly get weak or dizzy, or you feel like you might pass out (faint). This may also be called presyncope. This is due to a lack of blood flow to the brain. During an episode of near-syncope, you may:  Feel dizzy, weak, or light-headed.  Feel sick to your stomach (nauseous).  See all white or all black.  See spots.  Have cold, clammy skin. This condition is caused by a sudden decrease in blood flow to the brain. This decrease can result from various causes, but most of those causes are not dangerous. However, near-syncope may be a sign of a serious medical problem, so it is important to seek medical care. Follow these instructions at home: Medicines  Take over-the-counter and prescription medicines only as told by your doctor.  If you are taking blood pressure or heart medicine, get up slowly and spend many minutes getting ready to sit and then stand. This can help with dizziness. General instructions  Be aware of any changes in your symptoms.  Talk with your doctor about your symptoms. You may need to have testing to find the cause of your near-syncope.  If you start to feel like you might pass out, lie down right away. Raise (elevate) your feet above the level of your heart. Breathe deeply and steadily. Wait until all of the symptoms are gone.  Have someone stay with you until you feel stable.  Do not drive, use machinery, or play sports until your doctor says it is okay.  Drink enough fluid to keep your pee (urine) pale yellow.  Keep all follow-up visits as told by your doctor. This is important. Get help right away if you:  Have a seizure.  Have pain in your: ? Chest. ? Belly (abdomen). ? Back.  Faint once or more than once.  Have a very bad headache.  Are bleeding from your mouth or butt.  Have black or tarry poop (stool).  Have a very fast or uneven heartbeat (palpitations).  Are mixed up (confused).  Have trouble  walking.  Are very weak.  Have trouble seeing. These symptoms may be an emergency. Do not wait to see if the symptoms will go away. Get medical help right away. Call your local emergency services (911 in the [place] Do not drive yourself to the hospital. Summary  Near-syncope is when you suddenly get weak or dizzy, or you feel like you might pass out (faint).  This condition is caused by a lack of blood flow to the brain.  Near-syncope may be a sign of a serious medical problem, so it is important to seek medical care. This information is not intended to replace advice given to you by your health care provider. Make sure you discuss any questions you have with your health care provider. Document Revised: 07/14/2018 Document Reviewed: 02/08/2018 Elsevier Patient Education  2020 Elsevier Inc.  

## 2019-12-03 ENCOUNTER — Ambulatory Visit: Payer: Self-pay | Admitting: General Surgery

## 2019-12-03 NOTE — H&P (Signed)
The patient is a 74 year old male who presents with colovesical fistula. 74 year old male with history of aortic aneurysm endovascular repair and stage III kidney disease, who presented to the hospital with severe diarrhea for approximately 2 months. Workup in the ED showed positive UTI. He reports he has been having intermittent symptoms for approximately 2 years with a 20 pound weight loss. CT scan was concerning for colovesicular fistula. He has recently underwent a colonoscopy which shows no sign of malignancy. He continues without air from the bladder. He denies any pain. He continues to have some weight loss despite eating, high-calorie diet. Past surgical history consistent for ruptured appendicitis.    Past Surgical History Michel Bickers, LPN; 9/38/1829 93:71 AM) Appendectomy  Diagnostic Studies History Michel Bickers, LPN; 6/96/7893 81:01 AM) Colonoscopy within last year  Allergies Michel Bickers, LPN; 7/51/0258 52:77 AM) No Known Drug Allergies [12/03/2019]: Allergies Reconciled  Medication History Michel Bickers, LPN; 11/27/2351 61:44 AM) Aspirin (81MG  Tablet, Oral) Active. Allegra (180MG  Tablet, Oral) Active. Aricept (10MG  Tablet, Oral) Active. Imodium (2MG  Capsule, Oral) Active. Amoxicillin-Pot Clavulanate (500-125MG  Tablet, Oral) Active. Probiotic (250MG  Capsule, Oral) Active. Medications Reconciled  Social History , LPN;  AM) Alcohol use Occasional alcohol use. Caffeine use Tea. Tobacco use Current some day smoker.  Other Problems , LPN; Michel Bickers AM) Gastroesophageal Reflux Disease Vascular Disease     Review of Systems 06/18/4006 [facility] LPN; Michel Bickers 9/50/9326 AM) General Present- Weight Loss. Not Present- Appetite Loss, Chills, Fatigue, Fever, Night Sweats and Weight Gain. Skin Not Present- Change in Wart/Mole, Dryness, Hives, Jaundice, New Lesions, Non-Healing Wounds, Rash and Ulcer. HEENT  Present- Seasonal Allergies and Wears glasses/contact lenses. Not Present- Earache, Hearing Loss, Hoarseness, Nose Bleed, Oral Ulcers, Ringing in the Ears, Sinus Pain, Sore Throat, Visual Disturbances and Yellow Eyes. Respiratory Not Present- Bloody sputum, Chronic Cough, Difficulty Breathing, Snoring and Wheezing. Breast Not Present- Breast Mass, Breast Pain, Nipple Discharge and Skin Changes. Cardiovascular Not Present- Chest Pain, Difficulty Breathing Lying Down, Leg Cramps, Palpitations, Rapid Heart Rate, Shortness of Breath and Swelling of Extremities. Male Genitourinary Not Present- Blood in Urine, Change in Urinary Stream, Frequency, Impotence, Nocturia, Painful Urination, Urgency and Urine Leakage. Musculoskeletal Not Present- Back Pain, Joint Pain, Joint Stiffness, Muscle Pain, Muscle Weakness and Swelling of Extremities. Neurological Present- Decreased Memory. Not Present- Fainting, Headaches, Numbness, Seizures, Tingling, Tremor, Trouble walking and Weakness. Psychiatric Not Present- Anxiety, Bipolar, Change in Sleep Pattern, Depression, Fearful and Frequent crying. Endocrine Not Present- Cold Intolerance, Excessive Hunger, Hair Changes, Heat Intolerance, Hot flashes and New Diabetes.  Vitals 71:24 Dockery LPN; Tresa Endo [facility] AM) 12/03/2019 11:07 AM Weight: 146.8 lb Height: 67in Body Surface Area: 1.77 m Body Mass Index: 22.99 kg/m  Temp.: 97.9F(Thermal Scan)  Pulse: 80 (Regular)  BP: 120/70(Sitting, Left Arm, Standard)        Physical Exam 75:25 MD; 12/03/2019 11:26 AM)  General Mental Status-Alert. General Appearance-Cooperative.  Abdomen Palpation/Percussion Palpation and Percussion of the abdomen reveal - Soft and Non Tender.    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 She is on chronic antibiotics for this  and is currently asymptomatic. He is still losing some weight. We discussed possibly performing a robotic-assisted sigmoidectomy in October 2021. I would like for him to work on gaining weight and getting in his back, over the next 4 weeks. We will plan on having firefly injected into  each ureter prior to surgery for better identification during dissection. Discussed the possibility of bladder repair and the need for postoperative catheter. The surgery and anatomy were described to the patient as well as the risks of surgery and the possible complications. These include: Bleeding, deep abdominal infections and possible wound complications such as hernia and infection, damage to adjacent structures, leak of surgical connections, which can lead to other surgeries and possibly an ostomy, possible need for other procedures, such as abscess drains in radiology, possible prolonged hospital stay, possible diarrhea from removal of part of the colon, possible constipation from narcotics, possible bowel, bladder or sexual dysfunction if having rectal surgery, prolonged fatigue/weakness or appetite loss, possible early recurrence of of disease, possible complications of their medical problems such as heart disease or arrhythmias or lung problems, death (less than 1%). I believe the patient understands and wishes to proceed with the surgery.

## 2019-12-13 ENCOUNTER — Other Ambulatory Visit: Payer: Self-pay | Admitting: Urology

## 2019-12-19 ENCOUNTER — Other Ambulatory Visit: Payer: Self-pay | Admitting: Urology

## 2019-12-26 ENCOUNTER — Ambulatory Visit: Payer: Medicare Other | Attending: Internal Medicine

## 2019-12-26 ENCOUNTER — Other Ambulatory Visit: Payer: Self-pay

## 2019-12-26 DIAGNOSIS — R262 Difficulty in walking, not elsewhere classified: Secondary | ICD-10-CM | POA: Insufficient documentation

## 2019-12-26 DIAGNOSIS — R2689 Other abnormalities of gait and mobility: Secondary | ICD-10-CM | POA: Insufficient documentation

## 2019-12-26 DIAGNOSIS — M6281 Muscle weakness (generalized): Secondary | ICD-10-CM | POA: Insufficient documentation

## 2019-12-26 DIAGNOSIS — R5383 Other fatigue: Secondary | ICD-10-CM | POA: Insufficient documentation

## 2019-12-27 NOTE — Therapy (Signed)
[facility] [address] [place], [place], [address] Phone: [phone]   Fax:  [phone]  Physical Therapy Evaluation  Patient Details  Name: [name] Juarez MRN: [id number] Date of Birth: [birth year] Referring Provider (PT): Burnadette Pop, MD   Encounter Date: 12/26/2019   PT End of Session - 12/27/19 0718    Visit Number 1    Number of Visits 4    Date for PT Re-Evaluation 01/30/20    Authorization Type [employer] MEDICARE    Progress Note Due on Visit 4    PT Start Time 1530    PT Stop Time 1617    PT Time Calculation (min) 47 min    Activity Tolerance Patient tolerated treatment well    Behavior During Therapy [facility] for tasks assessed/performed           Past Medical History:  Diagnosis Date  . Arthritis   . Colon polyp 10/2006  . Erectile dysfunction   . Hyperlipidemia   . Hypertension   . Tuberculosis    age 74  . Varicose veins     Past Surgical History:  Procedure Laterality Date  . ABDOMINAL AORTIC ENDOVASCULAR STENT GRAFT  11/03/2011  . APPENDECTOMY    . INGUINAL HERNIA REPAIR Right   . KNEE SURGERY Right   . LAPAROSCOPIC APPENDECTOMY N/A 08/03/2013   Procedure: APPENDECTOMY LAPAROSCOPIC;  Surgeon: Velora Heckler, MD;  Location: [facility];  Service: General;  Laterality: N/A;    There were no vitals filed for this visit.    Subjective Assessment - 12/27/19 0708    Subjective Pt reports a fall with hospitalization on 8/11 due to dehydration related to diarrhea. Since then he reports being weak all over. He denies additionl falls. He staes he has started to walk on a treadmill and is up to 15 mins a day.    Pertinent History Bowel and bladder isses    Limitations Standing;Walking    Patient Stated Goals For my arms and legs to get stronger so I can return to an active life    Currently in Pain? No/denies              [facility] PT Assessment - 12/27/19 0001      Assessment   Medical Diagnosis  Weakness, decreased balance and activity tolerance    Referring Provider (PT) Burnadette Pop, MD    Onset Date/Surgical Date 11/14/19    Hand Dominance Right    Prior Therapy No      Precautions   Precautions Fall      Restrictions   Weight Bearing Restrictions No      Balance Screen   Has the patient fallen in the past 6 months Yes    How many times? 1    Has the patient had a decrease in activity level because of a fear of falling?  Yes    Is the patient reluctant to leave their home because of a fear of falling?  No      Home Nurse, mental health Private residence    Living Arrangements Spouse/significant other    Type of Home House    Home Access Stairs to enter    Entrance Stairs-Number of Steps 2    Entrance Stairs-Rails Right;Left;Can reach both    Home Layout One level      Prior Function   Level of Independence Independent    Vocation Retired    Leisure [employer]  Overall Cognitive Status Within Functional Limits for tasks assessed      Observation/Other Assessments   Focus on Therapeutic Outcomes (FOTO)  47% limitation      Sensation   Light Touch Appears Intact      Posture/Postural Control   Posture/Postural Control Postural limitations    Postural Limitations Forward head;Rounded Shoulders;Decreased lumbar lordosis;Increased thoracic kyphosis;Flexed trunk   flexed knees     ROM / Strength   AROM / PROM / Strength AROM;Strength      AROM   Overall AROM Comments Decreased kne ext due to hamstring tightness       Strength   Overall Strength Comments Bilat shoulders 3/5; bilat elbow and wrist 4/5; L hip 2/5; L knee and ankle 3/5; R hip 3-/5; R knee and ankle 4/5      Transfers   Transfers Sit to Stand;Stand to Sit    Sit to Stand 6: Modified independent (Device/Increase time)   uses hands   Five time sit to stand comments  25.5 c use of hands      Ambulation/Gait   Gait Pattern Step-through pattern;Decreased stride  length;Trunk flexed   decreased pace                     Objective measurements completed on examination: See above findings.               PT Education - 12/27/19 0716    Education Details Eval findings, POC, HEP    Person(s) Educated Patient    Methods Explanation;Demonstration;Tactile cues;Verbal cues;Handout    Comprehension Verbalized understanding;Returned demonstration;Verbal cues required;Tactile cues required;Need further instruction            PT Short Term Goals - 12/27/19 0744      PT SHORT TERM GOAL #1   Title Assess Berg and set goal    Status New    Target Date 01/11/20             PT Long Term Goals - 12/27/19 0737      PT LONG TERM GOAL #1   Title Pt will be Ind in a final HEP    Baseline Started on eval    Status New    Target Date 01/30/20      PT LONG TERM GOAL #2   Title Improved 5xSTS to 20 sec or less    Baseline 25.5 sec    Status New    Target Date 01/30/20      PT LONG TERM GOAL #3   Title Improved knee ext ROM to 0d c quad set supine    Baseline min knee flexion in supine    Status New    Target Date 01/30/20      PT LONG TERM GOAL #4   Title Improve to UE and LE strength to a range of 3+ to 4+/5    Baseline 2- to 4/5    Status New    Target Date 01/30/20      PT LONG TERM GOAL #5   Title Pt will reports improved funtional ability reporting a little difficulty with moderate activities of vacuuming and lifting groceries    Baseline Alot of difficulty    Status New    Target Date 01/30/20      Additional Long Term Goals   Additional Long Term Goals Yes      PT LONG TERM GOAL #6   Title Improved FOTO survey score to predicted value of 28% limitation  Baseline 47% limitation    Period Days    Status New    Target Date 01/30/20                  Plan - 12/27/19 0721    Clinical Impression Statement Pt presents with decreased strength of the arms, legs and trunk imacting his posture and  quality of transfers and gait re: balance and tolerance. Pt will benefit from PT 1w4 to address these deficits. Pt is scheduled for a partial colectomy on 10/28.    Personal Factors and Comorbidities Age;Fitness;Past/Current Experience;Comorbidity 2    Comorbidities Recent fall, arthritis    Examination-Activity Limitations Locomotion Level;Squat;Stairs;Stand;Lift;Transfers    Stability/Clinical Decision Making Evolving/Moderate complexity    Clinical Decision Making Moderate    Rehab Potential Good    PT Frequency 1x / week    PT Duration 4 weeks    PT Treatment/Interventions ADLs/Self Care Home Management;Gait training;Stair training;Functional mobility training;Therapeutic activities;Therapeutic exercise;Balance training;Manual techniques;Patient/family education;Passive range of motion;Dry needling;Taping;Vasopneumatic Device    PT Next Visit Plan Assess response to HEP, review FOTO, assess 2 MWT and Berg; provide ther ex to address UE, core and back strength    PT Home Exercise Plan [email or web address]    Consulted and Agree with Plan of Care Patient           Patient will benefit from skilled therapeutic intervention in order to improve the following deficits and impairments:  Difficulty walking, Abnormal gait, Decreased range of motion, Decreased endurance, Decreased activity tolerance, Postural dysfunction, Decreased strength, Decreased mobility, Decreased balance, Impaired flexibility  Visit Diagnosis: Muscle weakness (generalized)  Difficulty in walking, not elsewhere classified  Fatigue, unspecified type  Other abnormalities of gait and mobility  Balance problems     Problem List Patient Active Problem List   Diagnosis Date Noted  . Colovesical fistula 11/14/2019  . Acute renal failure - resolving 08/08/2013  . Hypertension   . Acute appendicitis with peritonitis, perforation s/p lap appy 08/03/2013 08/03/2013  . Abdominal aneurysm without mention of rupture 09/28/2011  .  Varicose veins of lower extremities with other complications 04/27/2011   Joellyn Rued MS, PT 12/27/19 7:52 AM  [facility] [address] [place], [place], [address] Phone: [phone]   Fax:  [phone]  Name: Saifullah Jolley MRN: [id number] Date of Birth: November 16, 1945

## 2020-01-11 ENCOUNTER — Other Ambulatory Visit: Payer: Self-pay

## 2020-01-11 ENCOUNTER — Ambulatory Visit: Payer: Medicare Other | Attending: Internal Medicine | Admitting: Physical Therapy

## 2020-01-11 ENCOUNTER — Encounter: Payer: Self-pay | Admitting: Physical Therapy

## 2020-01-11 DIAGNOSIS — R2689 Other abnormalities of gait and mobility: Secondary | ICD-10-CM | POA: Diagnosis present

## 2020-01-11 DIAGNOSIS — M6281 Muscle weakness (generalized): Secondary | ICD-10-CM | POA: Diagnosis present

## 2020-01-11 DIAGNOSIS — R5383 Other fatigue: Secondary | ICD-10-CM | POA: Diagnosis present

## 2020-01-11 DIAGNOSIS — R262 Difficulty in walking, not elsewhere classified: Secondary | ICD-10-CM | POA: Insufficient documentation

## 2020-01-11 NOTE — Patient Instructions (Signed)
Access Code: [email or web address] URL: [email or web address] Date: 01/11/2020 Prepared by: Rosana Hoes  Exercises Standing Hip Abduction with Counter Support - 2 x daily - 7 x weekly - 1 sets - 10-20 reps Standing Hip Extension with Counter Support - 2 x daily - 7 x weekly - 1 sets - 10-20 reps Standing March with Counter Support - 2 x daily - 7 x weekly - 1 sets - 10-20 reps Standing Knee Flexion with Counter Support - 2 x daily - 7 x weekly - 1 sets - 10-20 reps Heel rises with counter support - 2 x daily - 7 x weekly - 1 sets - 10-20 reps Heel Toe Raises with Counter Support - 2 x daily - 7 x weekly - 1 sets - 10-20 reps Sit to Stand with Arms Crossed - 2 x daily - 7 x weekly - 10-20 reps Standing Row with Anchored Resistance - 2 x daily - 7 x weekly - 10-20 reps Standing Tandem Balance with Counter Support - 2 x daily - 7 x weekly - 2 sets - 30-60 seconds hold

## 2020-01-11 NOTE — Therapy (Addendum)
[name] Juarez, [place], [address] Phone: [phone]   Fax:  [phone]  Physical Therapy Treatment / Discharge  Patient Details  Name: [name] Juarez MRN: [id number] Date of Birth: 1945/12/10 Referring Provider (PT): Shelly Coss, MD   Encounter Date: 01/11/2020   PT End of Session - 01/11/20 1005    Visit Number 2    Number of Visits 4    Date for PT Re-Evaluation 01/30/20    Authorization Type [employer] MEDICARE    Progress Note Due on Visit 4    PT Start Time 1000    PT Stop Time 1040    PT Time Calculation (min) 40 min    Activity Tolerance Patient tolerated treatment well    Behavior During Therapy [name] Juarez for tasks assessed/performed           Past Medical History:  Diagnosis Date  . Arthritis   . Colon polyp 10/2006  . Erectile dysfunction   . Hyperlipidemia   . Hypertension   . Tuberculosis    age 74  . Varicose veins     Past Surgical History:  Procedure Laterality Date  . ABDOMINAL AORTIC ENDOVASCULAR STENT GRAFT  11/03/2011  . APPENDECTOMY    . INGUINAL HERNIA REPAIR Right   . KNEE SURGERY Right   . LAPAROSCOPIC APPENDECTOMY N/A 08/03/2013   Procedure: APPENDECTOMY LAPAROSCOPIC;  Surgeon: Earnstine Regal, MD;  Location: [facility];  Service: General;  Laterality: N/A;    There were no vitals filed for this visit.   Subjective Assessment - 01/11/20 1003    Subjective Patient reports he pulled his groin a few weeks ago doing his exercises. Other than the groin, he is feeling fine.    Patient Stated Goals For my arms and legs to get stronger so I can return to an active life    Currently in Pain? No/denies              [facility] PT Assessment - 01/11/20 0001      Standardized Balance Assessment   Standardized Balance Assessment Berg Balance Test      Berg Balance Test   Sit to Stand Able to stand without using hands and stabilize independently    Standing Unsupported Able to  stand safely 2 minutes    Sitting with Back Unsupported but Feet Supported on Floor or Stool Able to sit safely and securely 2 minutes    Stand to Sit Controls descent by using hands    Transfers Able to transfer safely, minor use of hands    Standing Unsupported with Eyes Closed Able to stand 10 seconds safely    Standing Unsupported with Feet Together Able to place feet together independently and stand 1 minute safely    From Standing, Reach Forward with Outstretched Arm Can reach forward >12 cm safely (5")    From Standing Position, Pick up Object from Floor Able to pick up shoe safely and easily    From Standing Position, Turn to Look Behind Over each Shoulder Turn sideways only but maintains balance    Turn 360 Degrees Able to turn 360 degrees safely one side only in 4 seconds or less    Standing Unsupported, Alternately Place Feet on Step/Stool Able to complete >2 steps/needs minimal assist    Standing Unsupported, One Foot in [employer] balance while stepping or standing    Standing on One Leg Tries to lift leg/unable to hold 3 seconds but remains standing independently  Total Score 41                         [facility] Adult PT Treatment/Exercise - 01/11/20 0001      Neuro Re-ed    Neuro Re-ed Details  Tandem stance 2 x 30 sec each      Exercises   Exercises Knee/Hip;Shoulder      Knee/Hip Exercises: Aerobic   Nustep L5 x 5 min LE only      Knee/Hip Exercises: Standing   Heel Raises 20 reps    Heel Raises Limitations heel-toe raises    Knee Flexion 20 reps    Hip Flexion 20 reps    Hip Flexion Limitations alternating marching    Hip Abduction 20 reps    Abduction Limitations red band    Hip Extension 20 reps    Extension Limitations red band      Knee/Hip Exercises: Seated   Sit to Sand 2 sets;10 reps;without UE support      Shoulder Exercises: Standing   Row 20 reps    Theraband Level (Shoulder Row) Level 3 [name] Juarez)                  PT  Education - 01/11/20 1005    Education Details HEP update, FOTO    Person(s) Educated Patient    Methods Explanation;Demonstration;Verbal cues;Handout    Comprehension Verbalized understanding;Returned demonstration;Verbal cues required;Need further instruction            PT Short Term Goals - 12/27/19 0744      PT SHORT TERM GOAL #1   Title Assess Berg and set goal    Status New    Target Date 01/11/20             PT Long Term Goals - 12/27/19 0737      PT LONG TERM GOAL #1   Title Pt will be Ind in a final HEP    Baseline Started on eval    Status New    Target Date 01/30/20      PT LONG TERM GOAL #2   Title Improved 5xSTS to 20 sec or less    Baseline 25.5 sec    Status New    Target Date 01/30/20      PT LONG TERM GOAL #3   Title Improved knee ext ROM to 0d c quad set supine    Baseline min knee flexion in supine    Status New    Target Date 01/30/20      PT LONG TERM GOAL #4   Title Improve to UE and LE strength to a range of 3+ to 4+/5    Baseline 2- to 4/5    Status New    Target Date 01/30/20      PT LONG TERM GOAL #5   Title Pt will reports improved funtional ability reporting a little difficulty with moderate activities of vacuuming and lifting groceries    Baseline Alot of difficulty    Status New    Target Date 01/30/20      Additional Long Term Goals   Additional Long Term Goals Yes      PT LONG TERM GOAL #6   Title Improved FOTO survey score to predicted value of 28% limitation    Baseline 47% limitation    Period Days    Status New    Target Date 01/30/20  Plan - 01/11/20 1006    Clinical Impression Statement Patient tolerated therapy well with no adverse effects. He did exhibit deficit with BERG balance test especially with single leg tasks or stepping. Progress strengthening and balance exercises for home with good tolerance. No increase in groin pain this visit. He would benefit from continued skilled PT to  progress strength and balance to reduce fall risk and improve walking ability.    PT Treatment/Interventions ADLs/Self Care Home Management;Gait training;Stair training;Functional mobility training;Therapeutic activities;Therapeutic exercise;Balance training;Manual techniques;Patient/family education;Passive range of motion;Dry needling;Taping;Vasopneumatic Device    PT Next Visit Plan Review HEP and progress PRN, continue general strengthening and balance training, dynamic gait training, step training    PT Home Exercise Plan [email or web address]    Consulted and Agree with Plan of Care Patient           Patient will benefit from skilled therapeutic intervention in order to improve the following deficits and impairments:  Difficulty walking, Abnormal gait, Decreased range of motion, Decreased endurance, Decreased activity tolerance, Postural dysfunction, Decreased strength, Decreased mobility, Decreased balance, Impaired flexibility  Visit Diagnosis: Muscle weakness (generalized)  Difficulty in walking, not elsewhere classified  Fatigue, unspecified type  Other abnormalities of gait and mobility  Balance problems     Problem List Patient Active Problem List   Diagnosis Date Noted  . Colovesical fistula 11/14/2019  . Acute renal failure - resolving 08/08/2013  . Hypertension   . Acute appendicitis with peritonitis, perforation s/p lap appy 08/03/2013 08/03/2013  . Abdominal aneurysm without mention of rupture 09/28/2011  . Varicose veins of lower extremities with other complications 97/91/5041    Hilda Blades, PT, DPT, LAT, ATC 01/11/20  10:41 AM Phone: [phone] Fax: [facility] [address] [place], [place], [address] Phone: [phone]   Fax:  [phone]  Name: [name] Juarez MRN: [id number] Date of Birth: 02-16-1946   PHYSICAL THERAPY DISCHARGE SUMMARY  Visits from Start of Care: 2  Current functional  level related to goals / functional outcomes: See above   Remaining deficits: See above   Education / Equipment: HEP Plan:                                                    Patient goals were not met. Patient is being discharged due to not returning since the last visit.  ?????    Hilda Blades, PT, DPT, LAT, ATC 03/18/20  9:59 AM Phone: [phone] Fax: [phone]

## 2020-01-17 NOTE — Progress Notes (Signed)
DUE TO COVID-19 ONLY ONE VISITOR IS ALLOWED TO COME WITH YOU AND STAY IN THE WAITING ROOM ONLY DURING PRE OP AND PROCEDURE DAY OF SURGERY. THE 1 VISITOR  MAY VISIT WITH YOU AFTER SURGERY IN YOUR PRIVATE ROOM DURING VISITING HOURS ONLY!  YOU NEED TO HAVE A COVID 19 TEST ON__10/25/2021 _____ @_______ , THIS TEST MUST BE DONE BEFORE SURGERY,  COVID TESTING SITE [address] [place] , IT IS ON THE RIGHT GOING OUT [address] APPROXIMATELY  2 MINUTES PAST [employer] ON THE RIGHT. ONCE YOUR COVID TEST IS COMPLETED,  PLEASE BEGIN THE QUARANTINE INSTRUCTIONS AS OUTLINED IN YOUR HANDOUT.                [name] Juarez  01/17/2020   Your procedure is scheduled on: 01/31/2020    Report to [facility] Main  Entrance   Report to admitting at   0830 AM     Call this number if you have problems the morning of surgery [phone]           REMEMBER: FOLLOW ALL YOUR BOWEL PREP INSTRUCTIONS WITH CLEAR LIQUIDS FROM YOUR SURGEON'S INSTRUCTIONS. DRINK 2 PRESURGERY ENSURE DRINKS THE NIGHT BEFORE SURGERY AT 1000 PM AND 1 PRESURGERY DRINK THE DAY OF THE PROCEDURE 3 HOURS PRIOR TO SCHEDULED SURGERY.  NOTHING BY MOUTH EXCEPT CLEAR LIQUIDS UNTIL THREE HOURS PRIOR TO SCHEDULED SURGERY. PLEASE FINISH PRESURGERY 3RD  ENSURE  DRINK PER SURGEON ORDER 3 HOURS PRIOR TO SCHEDULED SURGERY TIME WHICH NEEDS TO BE COMPLETED AT __0730am_______.   Clear liquid diet on day of bowel prep   CLEAR LIQUID DIET   Foods Allowed                                                                      Coffee and tea, regular and decaf                           Plain Jell-O any favor except red or purple                                         Fruit ices (not with fruit pulp)                                      Iced Popsicles                                     Carbonated beverages, regular and diet                                    Cranberry, grape and apple juices Sports drinks like  Gatorade Lightly seasoned clear broth or consume(fat free) Sugar, honey syrup  _____________________________________________________________________      BRUSH YOUR TEETH MORNING OF SURGERY AND RINSE YOUR MOUTH OUT, NO CHEWING GUM CANDY OR MINTS.  Take these medicines the morning of surgery with A SIP OF WATER:  Aricept, Allegra                                 You may not have any metal on your body including hair pins and              piercings  Do not wear jewelry, make-up, lotions, powders or perfumes, deodorant             Do not wear nail polish on your fingernails.  Do not shave  48 hours prior to surgery.              Men may shave face and neck.   Do not bring valuables to the hospital. [facility] IS NOT             RESPONSIBLE   FOR VALUABLES.  Contacts, dentures or bridgework may not be worn into surgery.  Leave suitcase in the car. After surgery it may be brought to your room.                 Please read over the following fact sheets you were given: _____________________________________________________________________  [facility] - Preparing for Surgery Before surgery, you can play an important role.  Because skin is not sterile, your skin needs to be as free of germs as possible.  You can reduce the number of germs on your skin by washing with CHG (chlorahexidine gluconate) soap before surgery.  CHG is an antiseptic cleaner which kills germs and bonds with the skin to continue killing germs even after washing. Please DO NOT use if you have an allergy to CHG or antibacterial soaps.  If your skin becomes reddened/irritated stop using the CHG and inform your nurse when you arrive at Short Stay. Do not shave (including legs and underarms) for at least 48 hours prior to the first CHG shower.  You may shave your face/neck. Please follow these instructions carefully:  1.  Shower with CHG Soap the night before surgery and the  morning of Surgery.  2.  If you choose to  wash your hair, wash your hair first as usual with your  normal  shampoo.  3.  After you shampoo, rinse your hair and body thoroughly to remove the  shampoo.                           4.  Use CHG as you would any other liquid soap.  You can apply chg directly  to the skin and wash                       Gently with a scrungie or clean washcloth.  5.  Apply the CHG Soap to your body ONLY FROM THE NECK DOWN.   Do not use on face/ open                           Wound or open sores. Avoid contact with eyes, ears mouth and genitals (private parts).                       Wash face,  Genitals (private parts) with your normal soap.             6.  Wash thoroughly, paying  special attention to the area where your surgery  will be performed.  7.  Thoroughly rinse your body with warm water from the neck down.  8.  DO NOT shower/wash with your normal soap after using and rinsing off  the CHG Soap.                9.  Pat yourself dry with a clean towel.            10.  Wear clean pajamas.            11.  Place clean sheets on your bed the night of your first shower and do not  sleep with pets. Day of Surgery : Do not apply any lotions/deodorants the morning of surgery.  Please wear clean clothes to the hospital/surgery center.  FAILURE TO FOLLOW THESE INSTRUCTIONS MAY RESULT IN THE CANCELLATION OF YOUR SURGERY PATIENT SIGNATURE_________________________________  NURSE SIGNATURE__________________________________  ________________________________________________________________________

## 2020-01-18 ENCOUNTER — Ambulatory Visit: Payer: Medicare Other | Admitting: Physical Therapy

## 2020-01-21 ENCOUNTER — Encounter (HOSPITAL_COMMUNITY): Payer: Self-pay

## 2020-01-21 ENCOUNTER — Other Ambulatory Visit: Payer: Self-pay

## 2020-01-21 ENCOUNTER — Encounter (HOSPITAL_COMMUNITY)
Admission: RE | Admit: 2020-01-21 | Discharge: 2020-01-21 | Disposition: A | Discharge status: Home / Self Care | Payer: Medicare Other | Source: Ambulatory Visit | Attending: General Surgery | Admitting: General Surgery

## 2020-01-21 DIAGNOSIS — Z01812 Encounter for preprocedural laboratory examination: Secondary | ICD-10-CM | POA: Diagnosis present

## 2020-01-21 HISTORY — DX: Unilateral inguinal hernia, without obstruction or gangrene, not specified as recurrent: K40.90

## 2020-01-21 LAB — CBC
HCT: 41.4 % (ref 39.0–52.0)
Hemoglobin: 13.4 g/dL (ref 13.0–17.0)
MCH: 30.3 pg (ref 26.0–34.0)
MCHC: 32.4 g/dL (ref 30.0–36.0)
MCV: 93.7 fL (ref 80.0–100.0)
Platelets: 339 10*3/uL (ref 150–400)
RBC: 4.42 MIL/uL (ref 4.22–5.81)
RDW: 13.4 % (ref 11.5–15.5)
WBC: 7.1 10*3/uL (ref 4.0–10.5)
nRBC: 0 % (ref 0.0–0.2)

## 2020-01-21 LAB — BASIC METABOLIC PANEL
Anion gap: 10 (ref 5–15)
BUN: 27 mg/dL — ABNORMAL HIGH (ref 8–23)
CO2: 26 mmol/L (ref 22–32)
Calcium: 9.3 mg/dL (ref 8.9–10.3)
Chloride: 101 mmol/L (ref 98–111)
Creatinine, Ser: 1.41 mg/dL — ABNORMAL HIGH (ref 0.61–1.24)
GFR, Estimated: 48 mL/min — ABNORMAL LOW (ref >60)
Glucose, Bld: 102 mg/dL — ABNORMAL HIGH (ref 70–99)
Potassium: 4.6 mmol/L (ref 3.5–5.1)
Sodium: 137 mmol/L (ref 135–145)

## 2020-01-21 NOTE — Progress Notes (Signed)
BMP done 01/21/2020 routed via epic to R Romie Levee and Dr Cardell Peach.

## 2020-01-21 NOTE — Progress Notes (Signed)
COVID Vaccine Completed: Yes Date COVID Vaccine completed: 01/09/20 booster received COVID vaccine manufacturer: Moderna    PCP - Dr. Johny Blamer Cardiologist - N/A  Chest x-ray - 11/2019 EKG - 11/2019 Stress Test - N/A ECHO - N/A Cardiac Cath - N/A Pacemaker/ICD device last checked: N/A  Sleep Study - N/A CPAP - N/A  Fasting Blood Sugar - N/A Checks Blood Sugar __N/A___ times a day  Blood Thinner Instructions: N/A Aspirin Instructions: N/A Last Dose: N/A  Anesthesia review: N/A  Patient denies shortness of breath, fever, cough and chest pain at PAT appointment   Patient verbalized understanding of instructions that were given to them at the PAT appointment. Patient was also instructed that they will need to review over the PAT instructions again at home before surgery.

## 2020-01-21 NOTE — Progress Notes (Addendum)
Anesthesia Review:  PCP:  Cardiologist : Chest x-ray : CT chest- 11/14/2019  EKG :11/14/2019  Echo : 11/15/2019  Stress test: no  Cardiac Cath :  Activity level:  Sleep Study/ CPAP :no  Fasting Blood Sugar :      / Checks Blood Sugar -- times a day:   Blood Thinner/ Instructions /Last Dose: ASA / Instructions/ Last Dose :  4/22/201 LOV - with Vasc Surgery for endovascular surgical repair of AAA

## 2020-01-25 ENCOUNTER — Ambulatory Visit: Payer: Medicare Other | Admitting: Physical Therapy

## 2020-01-28 ENCOUNTER — Other Ambulatory Visit (HOSPITAL_COMMUNITY)
Admission: RE | Admit: 2020-01-28 | Discharge: 2020-01-28 | Disposition: A | Discharge status: Home / Self Care | Payer: Medicare Other | Source: Ambulatory Visit | Attending: Neurosurgery | Admitting: Neurosurgery

## 2020-01-28 DIAGNOSIS — Z20822 Contact with and (suspected) exposure to covid-19: Secondary | ICD-10-CM | POA: Insufficient documentation

## 2020-01-28 DIAGNOSIS — Z01812 Encounter for preprocedural laboratory examination: Secondary | ICD-10-CM | POA: Insufficient documentation

## 2020-01-28 LAB — SARS CORONAVIRUS 2 (TAT 6-24 HRS): SARS Coronavirus 2: NEGATIVE

## 2020-01-30 MED ORDER — BUPIVACAINE LIPOSOME 1.3 % IJ SUSP
20.0000 mL | Freq: Once | INTRAMUSCULAR | Status: DC
  Filled 2020-01-30: qty 20

## 2020-01-30 NOTE — Anesthesia Preprocedure Evaluation (Addendum)
Anesthesia Evaluation  Patient identified by MRN, date of birth, ID band Patient awake    Reviewed: Allergy & Precautions, NPO status , Patient's Chart, lab work & pertinent test results  Airway Mallampati: II  TM Distance: >3 FB Neck ROM: Full    Dental no notable dental hx. (+) Teeth Intact, Dental Advisory Given   Pulmonary neg pulmonary ROS, former smoker,    Pulmonary exam normal breath sounds clear to auscultation       Cardiovascular Exercise Tolerance: Good hypertension, Normal cardiovascular exam Rhythm:Regular Rate:Normal  11/15/19 Echo Left ventricular ejection fraction, by estimation, is 50 to 55%. The left ventricle has low normal function. The left ventricle has no regional wall motion abnormalities. Left ventricular diastolic parameters were normal. 2. Right ventricular systolic function is normal. The right ventricular size is norma   Neuro/Psych negative neurological ROS  negative psych ROS   GI/Hepatic Neg liver ROS, Coloc vesicular fistula   Endo/Other  negative endocrine ROS  Renal/GU Renal InsufficiencyRenal diseaseLab Results      Component                Value               Date                      CREATININE               1.41 (H)            01/21/2020                BUN                      27 (H)              01/21/2020                NA                       137                 01/21/2020                K                        4.6                 01/21/2020                CL                       101                 01/21/2020                CO2                      26                  01/21/2020             negative genitourinary   Musculoskeletal  (+) Arthritis ,   Abdominal   Peds  Hematology negative hematology ROS (+) Lab Results      Component                Value  Date                      WBC                      7.1                 01/21/2020                HGB                       13.4                01/21/2020                HCT                      41.4                01/21/2020                MCV                      93.7                01/21/2020                PLT                      339                 01/21/2020              Anesthesia Other Findings   Reproductive/Obstetrics                            Anesthesia Physical Anesthesia Plan  ASA: II  Anesthesia Plan: General   Post-op Pain Management:    Induction: Intravenous  PONV Risk Score and Plan: 3 and Treatment may vary due to age or medical condition and Ondansetron  Airway Management Planned: Oral ETT  Additional Equipment: None  Intra-op Plan:   Post-operative Plan: Extubation in OR  Informed Consent: I have reviewed the patients History and Physical, chart, labs and discussed the procedure including the risks, benefits and alternatives for the proposed anesthesia with the patient or authorized representative who has indicated his/her understanding and acceptance.     Dental advisory given  Plan Discussed with: Anesthesiologist and CRNA  Anesthesia Plan Comments: (GA w Lidocaine drip + ketamine)       Anesthesia Quick Evaluation

## 2020-01-31 ENCOUNTER — Inpatient Hospital Stay (HOSPITAL_COMMUNITY): Payer: Medicare Other | Admitting: Anesthesiology

## 2020-01-31 ENCOUNTER — Inpatient Hospital Stay (HOSPITAL_COMMUNITY): Payer: Medicare Other | Admitting: Physician Assistant

## 2020-01-31 ENCOUNTER — Other Ambulatory Visit: Payer: Self-pay

## 2020-01-31 ENCOUNTER — Encounter (HOSPITAL_COMMUNITY): Payer: Self-pay | Admitting: General Surgery

## 2020-01-31 ENCOUNTER — Inpatient Hospital Stay (HOSPITAL_COMMUNITY)
Admission: RE | Admit: 2020-01-31 | Discharge: 2020-02-03 | DRG: 982 | Disposition: A | Discharge status: Home / Self Care | Payer: Medicare Other | Attending: General Surgery | Admitting: General Surgery

## 2020-01-31 ENCOUNTER — Encounter (HOSPITAL_COMMUNITY)
Admission: RE | Disposition: A | Discharge status: Home / Self Care | Payer: Self-pay | Source: Home / Self Care | Attending: General Surgery

## 2020-01-31 DIAGNOSIS — N35912 Unspecified bulbous urethral stricture, male: Secondary | ICD-10-CM | POA: Diagnosis present

## 2020-01-31 DIAGNOSIS — K219 Gastro-esophageal reflux disease without esophagitis: Secondary | ICD-10-CM | POA: Diagnosis present

## 2020-01-31 DIAGNOSIS — Z79899 Other long term (current) drug therapy: Secondary | ICD-10-CM

## 2020-01-31 DIAGNOSIS — F17200 Nicotine dependence, unspecified, uncomplicated: Secondary | ICD-10-CM | POA: Diagnosis present

## 2020-01-31 DIAGNOSIS — N529 Male erectile dysfunction, unspecified: Secondary | ICD-10-CM | POA: Diagnosis present

## 2020-01-31 DIAGNOSIS — K5790 Diverticulosis of intestine, part unspecified, without perforation or abscess without bleeding: Secondary | ICD-10-CM | POA: Diagnosis present

## 2020-01-31 DIAGNOSIS — E871 Hypo-osmolality and hyponatremia: Secondary | ICD-10-CM | POA: Diagnosis present

## 2020-01-31 DIAGNOSIS — N321 Vesicointestinal fistula: Secondary | ICD-10-CM | POA: Diagnosis present

## 2020-01-31 DIAGNOSIS — Z9049 Acquired absence of other specified parts of digestive tract: Secondary | ICD-10-CM | POA: Diagnosis not present

## 2020-01-31 DIAGNOSIS — Z7982 Long term (current) use of aspirin: Secondary | ICD-10-CM

## 2020-01-31 DIAGNOSIS — E785 Hyperlipidemia, unspecified: Secondary | ICD-10-CM | POA: Diagnosis present

## 2020-01-31 DIAGNOSIS — I10 Essential (primary) hypertension: Secondary | ICD-10-CM | POA: Diagnosis present

## 2020-01-31 DIAGNOSIS — Z20822 Contact with and (suspected) exposure to covid-19: Secondary | ICD-10-CM | POA: Diagnosis present

## 2020-01-31 DIAGNOSIS — Z792 Long term (current) use of antibiotics: Secondary | ICD-10-CM | POA: Diagnosis not present

## 2020-01-31 HISTORY — PX: CYSTOSCOPY W/ URETERAL STENT PLACEMENT: SHX1429

## 2020-01-31 LAB — TYPE AND SCREEN
ABO/RH(D): A POS
Antibody Screen: NEGATIVE

## 2020-01-31 SURGERY — COLECTOMY, PARTIAL, ROBOT-ASSISTED, LAPAROSCOPIC
Anesthesia: General

## 2020-01-31 MED ORDER — ONDANSETRON HCL 4 MG/2ML IJ SOLN
INTRAMUSCULAR | Status: AC
  Filled 2020-01-31: qty 2

## 2020-01-31 MED ORDER — GABAPENTIN 300 MG PO CAPS
300.0000 mg | ORAL_CAPSULE | Freq: Two times a day (BID) | ORAL | Status: DC
  Administered 2020-01-31: 300 mg via ORAL
  Filled 2020-01-31: qty 1

## 2020-01-31 MED ORDER — LACTATED RINGERS IV SOLN: INTRAVENOUS | Status: DC | PRN

## 2020-01-31 MED ORDER — ONDANSETRON HCL 4 MG/2ML IJ SOLN: 4.0000 mg | Freq: Four times a day (QID) | INTRAMUSCULAR | Status: DC | PRN

## 2020-01-31 MED ORDER — SACCHAROMYCES BOULARDII 250 MG PO CAPS
250.0000 mg | ORAL_CAPSULE | Freq: Two times a day (BID) | ORAL | Status: DC
  Administered 2020-01-31 – 2020-02-03 (×6): 250 mg via ORAL
  Filled 2020-01-31 (×6): qty 1

## 2020-01-31 MED ORDER — KETAMINE HCL 10 MG/ML IJ SOLN
INTRAMUSCULAR | Status: DC | PRN
  Administered 2020-01-31: 10 mg via INTRAVENOUS
  Administered 2020-01-31: 20 mg via INTRAVENOUS

## 2020-01-31 MED ORDER — TRAMADOL HCL 50 MG PO TABS: 50.0000 mg | ORAL_TABLET | Freq: Four times a day (QID) | ORAL | Status: DC | PRN

## 2020-01-31 MED ORDER — ENSURE PRE-SURGERY PO LIQD
592.0000 mL | Freq: Once | ORAL | Status: DC
  Filled 2020-01-31: qty 592

## 2020-01-31 MED ORDER — ALVIMOPAN 12 MG PO CAPS
12.0000 mg | ORAL_CAPSULE | Freq: Two times a day (BID) | ORAL | Status: DC
  Administered 2020-02-01: 09:00:00 12 mg via ORAL
  Filled 2020-01-31 (×2): qty 1

## 2020-01-31 MED ORDER — ALUM & MAG HYDROXIDE-SIMETH 200-200-20 MG/5ML PO SUSP: 30.0000 mL | Freq: Four times a day (QID) | ORAL | Status: DC | PRN

## 2020-01-31 MED ORDER — GABAPENTIN 300 MG PO CAPS
300.0000 mg | ORAL_CAPSULE | ORAL | Status: AC
  Administered 2020-01-31: 300 mg via ORAL
  Filled 2020-01-31: qty 1

## 2020-01-31 MED ORDER — LORATADINE 10 MG PO TABS
10.0000 mg | ORAL_TABLET | Freq: Every day | ORAL | Status: DC
  Administered 2020-02-01 – 2020-02-03 (×3): 10 mg via ORAL
  Filled 2020-01-31 (×4): qty 1

## 2020-01-31 MED ORDER — SIMETHICONE 80 MG PO CHEW
40.0000 mg | CHEWABLE_TABLET | Freq: Four times a day (QID) | ORAL | Status: DC | PRN
  Administered 2020-02-02: 40 mg via ORAL
  Filled 2020-01-31: qty 1

## 2020-01-31 MED ORDER — PHENYLEPHRINE 40 MCG/ML (10ML) SYRINGE FOR IV PUSH (FOR BLOOD PRESSURE SUPPORT)
PREFILLED_SYRINGE | INTRAVENOUS | Status: DC | PRN
  Administered 2020-01-31: 120 ug via INTRAVENOUS
  Administered 2020-01-31: 80 ug via INTRAVENOUS

## 2020-01-31 MED ORDER — ACETAMINOPHEN 500 MG PO TABS
1000.0000 mg | ORAL_TABLET | ORAL | Status: AC
  Administered 2020-01-31: 1000 mg via ORAL
  Filled 2020-01-31: qty 2

## 2020-01-31 MED ORDER — ROCURONIUM BROMIDE 10 MG/ML (PF) SYRINGE
PREFILLED_SYRINGE | INTRAVENOUS | Status: AC
  Filled 2020-01-31: qty 10

## 2020-01-31 MED ORDER — OXYCODONE HCL 5 MG/5ML PO SOLN: 5.0000 mg | Freq: Once | ORAL | Status: DC | PRN

## 2020-01-31 MED ORDER — BUPIVACAINE-EPINEPHRINE 0.25% -1:200000 IJ SOLN
INTRAMUSCULAR | Status: DC | PRN
  Administered 2020-01-31: 30 mL

## 2020-01-31 MED ORDER — ROCURONIUM BROMIDE 100 MG/10ML IV SOLN
INTRAVENOUS | Status: DC | PRN
  Administered 2020-01-31: 50 mg via INTRAVENOUS
  Administered 2020-01-31: 30 mg via INTRAVENOUS

## 2020-01-31 MED ORDER — FENTANYL CITRATE (PF) 250 MCG/5ML IJ SOLN
INTRAMUSCULAR | Status: AC
  Filled 2020-01-31: qty 5

## 2020-01-31 MED ORDER — LIDOCAINE 2% (20 MG/ML) 5 ML SYRINGE
INTRAMUSCULAR | Status: AC
  Filled 2020-01-31: qty 5

## 2020-01-31 MED ORDER — DONEPEZIL HCL 10 MG PO TABS
10.0000 mg | ORAL_TABLET | Freq: Every day | ORAL | Status: DC
  Administered 2020-02-01 – 2020-02-03 (×3): 10 mg via ORAL
  Filled 2020-01-31 (×4): qty 1

## 2020-01-31 MED ORDER — LIDOCAINE HCL (CARDIAC) PF 100 MG/5ML IV SOSY
PREFILLED_SYRINGE | INTRAVENOUS | Status: DC | PRN
  Administered 2020-01-31: 100 mg via INTRAVENOUS

## 2020-01-31 MED ORDER — ENOXAPARIN SODIUM 40 MG/0.4ML ~~LOC~~ SOLN
40.0000 mg | SUBCUTANEOUS | Status: DC
  Administered 2020-02-01 – 2020-02-03 (×3): 40 mg via SUBCUTANEOUS
  Filled 2020-01-31 (×3): qty 0.4

## 2020-01-31 MED ORDER — METHYLENE BLUE 0.5 % INJ SOLN
INTRAVENOUS | Status: DC | PRN
  Administered 2020-01-31: 10 mL

## 2020-01-31 MED ORDER — LIDOCAINE HCL 2 % IJ SOLN
INTRAMUSCULAR | Status: AC
  Filled 2020-01-31: qty 40

## 2020-01-31 MED ORDER — ONDANSETRON HCL 4 MG PO TABS: 4.0000 mg | ORAL_TABLET | Freq: Four times a day (QID) | ORAL | Status: DC | PRN

## 2020-01-31 MED ORDER — FENTANYL CITRATE (PF) 100 MCG/2ML IJ SOLN
INTRAMUSCULAR | Status: DC | PRN
Start: 2020-01-31 — End: 2020-01-31
  Administered 2020-01-31: 100 ug via INTRAVENOUS
  Administered 2020-01-31 (×2): 25 ug via INTRAVENOUS

## 2020-01-31 MED ORDER — ONDANSETRON HCL 4 MG/2ML IJ SOLN: 4.0000 mg | Freq: Once | INTRAMUSCULAR | Status: DC | PRN

## 2020-01-31 MED ORDER — STERILE WATER FOR INJECTION IJ SOLN
INTRAMUSCULAR | Status: AC
  Filled 2020-01-31: qty 10

## 2020-01-31 MED ORDER — DEXAMETHASONE SODIUM PHOSPHATE 10 MG/ML IJ SOLN
INTRAMUSCULAR | Status: AC
  Filled 2020-01-31: qty 1

## 2020-01-31 MED ORDER — LACTATED RINGERS IV SOLN: INTRAVENOUS | Status: DC

## 2020-01-31 MED ORDER — ENSURE PRE-SURGERY PO LIQD
296.0000 mL | Freq: Once | ORAL | Status: DC
  Filled 2020-01-31: qty 296

## 2020-01-31 MED ORDER — SODIUM CHLORIDE 0.9 % IR SOLN
Status: DC | PRN
  Administered 2020-01-31: 3000 mL

## 2020-01-31 MED ORDER — SUGAMMADEX SODIUM 200 MG/2ML IV SOLN
INTRAVENOUS | Status: DC | PRN
  Administered 2020-01-31: 200 mg via INTRAVENOUS

## 2020-01-31 MED ORDER — SODIUM CHLORIDE 0.9 % IV SOLN
2.0000 g | INTRAVENOUS | Status: AC
  Administered 2020-01-31: 2 g via INTRAVENOUS
  Filled 2020-01-31: qty 2

## 2020-01-31 MED ORDER — ALVIMOPAN 12 MG PO CAPS
12.0000 mg | ORAL_CAPSULE | ORAL | Status: AC
  Administered 2020-01-31: 12 mg via ORAL
  Filled 2020-01-31: qty 1

## 2020-01-31 MED ORDER — LIDOCAINE 2% (20 MG/ML) 5 ML SYRINGE
INTRAMUSCULAR | Status: DC | PRN
  Administered 2020-01-31: 1.5 mg/kg/h via INTRAVENOUS

## 2020-01-31 MED ORDER — KCL IN DEXTROSE-NACL 20-5-0.45 MEQ/L-%-% IV SOLN
INTRAVENOUS | Status: DC
  Filled 2020-01-31 (×2): qty 1000

## 2020-01-31 MED ORDER — ENSURE SURGERY PO LIQD
237.0000 mL | Freq: Two times a day (BID) | ORAL | Status: DC
  Administered 2020-02-01 (×2): 237 mL via ORAL

## 2020-01-31 MED ORDER — KETAMINE HCL 10 MG/ML IJ SOLN
INTRAMUSCULAR | Status: AC
  Filled 2020-01-31: qty 1

## 2020-01-31 MED ORDER — ORAL CARE MOUTH RINSE: 15.0000 mL | Freq: Once | OROMUCOSAL | Status: AC

## 2020-01-31 MED ORDER — INDOCYANINE GREEN 25 MG IV SOLR
INTRAVENOUS | Status: DC | PRN
  Administered 2020-01-31: 12.5 mg via INTRAVENOUS

## 2020-01-31 MED ORDER — HYDROMORPHONE HCL 1 MG/ML IJ SOLN: 0.5000 mg | INTRAMUSCULAR | Status: DC | PRN

## 2020-01-31 MED ORDER — ONDANSETRON HCL 4 MG/2ML IJ SOLN
INTRAMUSCULAR | Status: DC | PRN
  Administered 2020-01-31: 4 mg via INTRAVENOUS

## 2020-01-31 MED ORDER — ACETAMINOPHEN 500 MG PO TABS
1000.0000 mg | ORAL_TABLET | Freq: Four times a day (QID) | ORAL | Status: DC
  Administered 2020-01-31 – 2020-02-03 (×11): 1000 mg via ORAL
  Filled 2020-01-31 (×11): qty 2

## 2020-01-31 MED ORDER — PROPOFOL 10 MG/ML IV BOLUS
INTRAVENOUS | Status: AC
  Filled 2020-01-31: qty 20

## 2020-01-31 MED ORDER — BUPIVACAINE LIPOSOME 1.3 % IJ SUSP
INTRAMUSCULAR | Status: DC | PRN
  Administered 2020-01-31: 20 mL

## 2020-01-31 MED ORDER — CHLORHEXIDINE GLUCONATE 0.12 % MT SOLN
15.0000 mL | Freq: Once | OROMUCOSAL | Status: AC
  Administered 2020-01-31: 15 mL via OROMUCOSAL

## 2020-01-31 MED ORDER — PROPOFOL 10 MG/ML IV BOLUS
INTRAVENOUS | Status: DC | PRN
  Administered 2020-01-31: 130 mg via INTRAVENOUS

## 2020-01-31 MED ORDER — STERILE WATER FOR INJECTION IJ SOLN
INTRAMUSCULAR | Status: DC | PRN
  Administered 2020-01-31: 15 mL via INTRAMUSCULAR

## 2020-01-31 MED ORDER — KETOROLAC TROMETHAMINE 30 MG/ML IJ SOLN: 15.0000 mg | Freq: Once | INTRAMUSCULAR | Status: DC

## 2020-01-31 MED ORDER — OXYCODONE HCL 5 MG PO TABS: 5.0000 mg | ORAL_TABLET | Freq: Once | ORAL | Status: DC | PRN

## 2020-01-31 MED ORDER — BUPIVACAINE-EPINEPHRINE (PF) 0.25% -1:200000 IJ SOLN
INTRAMUSCULAR | Status: AC
  Filled 2020-01-31: qty 30

## 2020-01-31 MED ORDER — LACTATED RINGERS IR SOLN
Status: DC | PRN
  Administered 2020-01-31: 1000 mL

## 2020-01-31 MED ORDER — HYDROMORPHONE HCL 1 MG/ML IJ SOLN: 0.2500 mg | INTRAMUSCULAR | Status: DC | PRN

## 2020-01-31 MED ORDER — DEXAMETHASONE SODIUM PHOSPHATE 10 MG/ML IJ SOLN
INTRAMUSCULAR | Status: DC | PRN
  Administered 2020-01-31: 10 mg via INTRAVENOUS

## 2020-01-31 MED ORDER — SODIUM CHLORIDE 0.9 % IV SOLN
2.0000 g | Freq: Two times a day (BID) | INTRAVENOUS | Status: AC
  Administered 2020-01-31: 22:00:00 2 g via INTRAVENOUS
  Filled 2020-01-31: qty 2

## 2020-01-31 MED ORDER — EPHEDRINE SULFATE-NACL 50-0.9 MG/10ML-% IV SOSY
PREFILLED_SYRINGE | INTRAVENOUS | Status: DC | PRN
  Administered 2020-01-31: 10 mg via INTRAVENOUS
  Administered 2020-01-31: 5 mg via INTRAVENOUS

## 2020-01-31 SURGICAL SUPPLY — 101 items
BAG URO CATCHER STRL LF (MISCELLANEOUS) ×2 IMPLANT
BLADE EXTENDED COATED 6.5IN (ELECTRODE) IMPLANT
CANNULA REDUC XI 12-8 STAPL (CANNULA)
CANNULA REDUCER 12-8 DVNC XI (CANNULA) IMPLANT
CATH INTERMIT  6FR 70CM (CATHETERS) ×2 IMPLANT
CATH URET 5FR 28IN OPEN ENDED (CATHETERS) ×2 IMPLANT
CELLS DAT CNTRL 66122 CELL SVR (MISCELLANEOUS) IMPLANT
CLOTH BEACON ORANGE TIMEOUT ST (SAFETY) ×2 IMPLANT
COVER SURGICAL LIGHT HANDLE (MISCELLANEOUS) ×4 IMPLANT
COVER TIP SHEARS 8 DVNC (MISCELLANEOUS) ×1 IMPLANT
COVER TIP SHEARS 8MM DA VINCI (MISCELLANEOUS) ×2
COVER WAND RF STERILE (DRAPES) ×2 IMPLANT
DECANTER SPIKE VIAL GLASS SM (MISCELLANEOUS) IMPLANT
DRAIN CHANNEL 19F RND (DRAIN) IMPLANT
DRAPE ARM DVNC X/XI (DISPOSABLE) ×4 IMPLANT
DRAPE COLUMN DVNC XI (DISPOSABLE) ×1 IMPLANT
DRAPE DA VINCI XI ARM (DISPOSABLE) ×8
DRAPE DA VINCI XI COLUMN (DISPOSABLE) ×2
DRAPE SURG IRRIG POUCH 19X23 (DRAPES) ×2 IMPLANT
DRSG OPSITE POSTOP 4X10 (GAUZE/BANDAGES/DRESSINGS) IMPLANT
DRSG OPSITE POSTOP 4X6 (GAUZE/BANDAGES/DRESSINGS) ×1 IMPLANT
DRSG OPSITE POSTOP 4X8 (GAUZE/BANDAGES/DRESSINGS) IMPLANT
ELECT PENCIL ROCKER SW 15FT (MISCELLANEOUS) ×2 IMPLANT
ELECT REM PT RETURN 15FT ADLT (MISCELLANEOUS) ×2 IMPLANT
ENDOLOOP SUT PDS II  0 18 (SUTURE)
ENDOLOOP SUT PDS II 0 18 (SUTURE) IMPLANT
EVACUATOR SILICONE 100CC (DRAIN) IMPLANT
GLOVE BIO SURGEON STRL SZ 6.5 (GLOVE) ×6 IMPLANT
GLOVE BIOGEL M 7.0 STRL (GLOVE) ×2 IMPLANT
GLOVE BIOGEL PI IND STRL 7.0 (GLOVE) ×2 IMPLANT
GLOVE BIOGEL PI INDICATOR 7.0 (GLOVE) ×2
GOWN STRL REUS W/TWL LRG LVL3 (GOWN DISPOSABLE) ×4 IMPLANT
GOWN STRL REUS W/TWL XL LVL3 (GOWN DISPOSABLE) ×6 IMPLANT
GRASPER SUT TROCAR 14GX15 (MISCELLANEOUS) IMPLANT
GUIDEWIRE STR DUAL SENSOR (WIRE) ×2 IMPLANT
GUIDEWIRE ZIPWRE .038 STRAIGHT (WIRE) IMPLANT
HOLDER FOLEY CATH W/STRAP (MISCELLANEOUS) ×2 IMPLANT
IRRIG SUCT STRYKERFLOW 2 WTIP (MISCELLANEOUS) ×2
IRRIGATION SUCT STRKRFLW 2 WTP (MISCELLANEOUS) ×1 IMPLANT
KIT PROCEDURE DA VINCI SI (MISCELLANEOUS) ×2
KIT PROCEDURE DVNC SI (MISCELLANEOUS) IMPLANT
KIT TURNOVER KIT A (KITS) ×1 IMPLANT
MANIFOLD NEPTUNE II (INSTRUMENTS) ×2 IMPLANT
NDL INSUFFLATION 14GA 120MM (NEEDLE) ×1 IMPLANT
NEEDLE INSUFFLATION 14GA 120MM (NEEDLE) ×2 IMPLANT
PACK CARDIOVASCULAR III (CUSTOM PROCEDURE TRAY) ×2 IMPLANT
PACK COLON (CUSTOM PROCEDURE TRAY) ×2 IMPLANT
PACK CYSTO (CUSTOM PROCEDURE TRAY) ×2 IMPLANT
PAD POSITIONING PINK XL (MISCELLANEOUS) ×2 IMPLANT
PORT LAP GEL ALEXIS MED 5-9CM (MISCELLANEOUS) IMPLANT
RELOAD STAPLE 60 4.1 GRN THCK (STAPLE) IMPLANT
RELOAD STAPLE 60 4.3 GRN DVNC (STAPLE) IMPLANT
RELOAD STAPLER 4.3X60 GRN DVNC (STAPLE) ×1 IMPLANT
RELOAD STAPLER GREEN 60MM (STAPLE) ×2 IMPLANT
RETRACTOR WND ALEXIS 18 MED (MISCELLANEOUS) IMPLANT
RTRCTR WOUND ALEXIS 18CM MED (MISCELLANEOUS)
SCISSORS LAP 5X35 DISP (ENDOMECHANICALS) ×1 IMPLANT
SEAL CANN UNIV 5-8 DVNC XI (MISCELLANEOUS) ×3 IMPLANT
SEAL XI 5MM-8MM UNIVERSAL (MISCELLANEOUS) ×6
SEALER VESSEL DA VINCI XI (MISCELLANEOUS) ×2
SEALER VESSEL EXT DVNC XI (MISCELLANEOUS) ×1 IMPLANT
SOLUTION ELECTROLUBE (MISCELLANEOUS) ×2 IMPLANT
STAPLE ECHEON FLEX 60 POW ENDO (STAPLE) ×1 IMPLANT
STAPLER 45 DA VINCI SURE FORM (STAPLE)
STAPLER 45 SUREFORM DVNC (STAPLE) IMPLANT
STAPLER 60 DA VINCI SURE FORM (STAPLE) ×2
STAPLER 60 SUREFORM DVNC (STAPLE) IMPLANT
STAPLER CANNULA SEAL DVNC XI (STAPLE) IMPLANT
STAPLER CANNULA SEAL XI (STAPLE)
STAPLER ECHELON POWER CIR 29 (STAPLE) ×1 IMPLANT
STAPLER RELOAD 4.3X60 GREEN (STAPLE) ×2
STAPLER RELOAD 4.3X60 GRN DVNC (STAPLE) ×1
STAPLER RELOAD GREEN 60MM (STAPLE) ×4
STOPCOCK 4 WAY LG BORE MALE ST (IV SETS) ×4 IMPLANT
SUT ETHILON 2 0 PS N (SUTURE) IMPLANT
SUT NOVA NAB DX-16 0-1 5-0 T12 (SUTURE) ×4 IMPLANT
SUT PROLENE 2 0 KS (SUTURE) IMPLANT
SUT SILK 2 0 (SUTURE) ×2
SUT SILK 2 0 SH CR/8 (SUTURE) IMPLANT
SUT SILK 2-0 18XBRD TIE 12 (SUTURE) ×1 IMPLANT
SUT SILK 3 0 (SUTURE)
SUT SILK 3 0 SH CR/8 (SUTURE) ×2 IMPLANT
SUT SILK 3-0 18XBRD TIE 12 (SUTURE) IMPLANT
SUT V-LOC BARB 180 2/0GR6 GS22 (SUTURE)
SUT VIC AB 2-0 SH 18 (SUTURE) IMPLANT
SUT VIC AB 2-0 SH 27 (SUTURE)
SUT VIC AB 2-0 SH 27X BRD (SUTURE) IMPLANT
SUT VIC AB 3-0 SH 18 (SUTURE) IMPLANT
SUT VIC AB 4-0 PS2 27 (SUTURE) ×4 IMPLANT
SUT VICRYL 0 UR6 27IN ABS (SUTURE) ×2 IMPLANT
SUTURE V-LC BRB 180 2/0GR6GS22 (SUTURE) IMPLANT
SYR 10ML ECCENTRIC (SYRINGE) ×2 IMPLANT
SYS LAPSCP GELPORT 120MM (MISCELLANEOUS)
SYSTEM LAPSCP GELPORT 120MM (MISCELLANEOUS) IMPLANT
TOWEL OR 17X26 10 PK STRL BLUE (TOWEL DISPOSABLE) IMPLANT
TOWEL OR NON WOVEN STRL DISP B (DISPOSABLE) ×2 IMPLANT
TRAY FOLEY MTR SLVR 16FR STAT (SET/KITS/TRAYS/PACK) ×2 IMPLANT
TROCAR ADV FIXATION 5X100MM (TROCAR) ×2 IMPLANT
TUBING CONNECTING 10 (TUBING) ×6 IMPLANT
TUBING INSUFFLATION 10FT LAP (TUBING) ×2 IMPLANT
TUBING UROLOGY SET (TUBING) IMPLANT

## 2020-01-31 NOTE — Anesthesia Procedure Notes (Signed)
Procedure Name: Intubation Date/Time: 01/31/2020 11:19 AM Performed by: Yolonda Kida, CRNA Pre-anesthesia Checklist: Patient identified, Emergency Drugs available, Suction available and Patient being monitored Patient Re-evaluated:Patient Re-evaluated prior to induction Oxygen Delivery Method: Circle system utilized Preoxygenation: Pre-oxygenation with 100% oxygen Induction Type: IV induction Ventilation: Mask ventilation without difficulty Laryngoscope Size: Miller and 3 Grade View: Grade I Tube type: Oral Tube size: 7.5 mm Number of attempts: 1 Airway Equipment and Method: Stylet Placement Confirmation: ETT inserted through vocal cords under direct vision,  positive ETCO2 and breath sounds checked- equal and bilateral Secured at: 23 cm Tube secured with: Tape Dental Injury: Teeth and Oropharynx as per pre-operative assessment

## 2020-01-31 NOTE — Progress Notes (Signed)
Patient arrived to the floor @ 1630. Wife present at bedside. Patient has no c/o pain. 4 port sites secured with dermabond. Honey comb dressing present to incision on abdomen, scant amount of bloody drainage noted. Foley in place attached to drainage bag. 2 LPM oxygen via nasal cannula. Drinking black coffee. No complaints. Patient states he is hungry, Will advance diet per orders.

## 2020-01-31 NOTE — Transfer of Care (Signed)
Immediate Anesthesia Transfer of Care Note  Patient: [name] Juarez  Procedure(s) Performed: ROBOTIC LOW ANTERIOR RESECTION (N/A ) FIREFLY INJECTION (N/A )  Patient Location: PACU  Anesthesia Type:General  Level of Consciousness: drowsy  Airway & Oxygen Therapy: Patient Spontanous Breathing and Patient connected to face mask oxygen  Post-op Assessment: Report given to RN and Post -op Vital signs reviewed and stable  Post vital signs: Reviewed and stable  Last Vitals:  Vitals Value Taken Time  BP 110/63   Temp    Pulse 66   Resp 15 01/31/20 1355  SpO2 100   Vitals shown include unvalidated device data.  Last Pain:  Vitals:   01/31/20 0921  TempSrc:   PainSc: 0-No pain         Complications: No complications documented.

## 2020-01-31 NOTE — Anesthesia Postprocedure Evaluation (Signed)
Anesthesia Post Note  Patient: [name]  Procedure(s) Performed: ROBOTIC LOW ANTERIOR RESECTION (N/A ) FIREFLY INJECTION (N/A )     Patient location during evaluation: PACU Anesthesia Type: General Level of consciousness: awake and alert Pain management: pain level controlled Vital Signs Assessment: post-procedure vital signs reviewed and stable Respiratory status: spontaneous breathing, nonlabored ventilation, respiratory function stable and patient connected to nasal cannula oxygen Cardiovascular status: blood pressure returned to baseline and stable Postop Assessment: no apparent nausea or vomiting Anesthetic complications: no   No complications documented.  Last Vitals:  Vitals:   01/31/20 1530 01/31/20 1545  BP: (!) 106/93 (!) 103/55  Pulse: 65 60  Resp: 15 12  Temp: 36.6 C   SpO2: 93% 93%    Last Pain:  Vitals:   01/31/20 1530  TempSrc:   PainSc: 0-No pain                 Trevor Iha

## 2020-01-31 NOTE — Op Note (Signed)
01/31/2020  1:41 PM  PATIENT:  [name] Juarez  74 y.o. male  Patient Care Team: Johny Blamer, MD as PCP - General (Family Medicine) Pryor Ochoa, MD (Inactive) as Consulting Physician (Vascular Surgery)  PRE-OPERATIVE DIAGNOSIS:  colovesicular fistula  POST-OPERATIVE DIAGNOSIS:  colovesicular fistula  PROCEDURE:  ROBOTIC LOW ANTERIOR RESECTION FIREFLY INJECTION INTRAOPERATIVE ASSESSMENT OF PERFUSION   Surgeon(s): Romie Levee, MD Karie Soda, MD Jannifer Hick, MD  ASSISTANT: Dr Michaell Cowing   ANESTHESIA:   local and general  EBL: 10ml  Total I/O In: 300 [I.V.:300] Out: 25 [Blood:25]  Delay start of Pharmacological VTE agent (>24hrs) due to surgical blood loss or risk of bleeding:  no  DRAINS: none   SPECIMEN:  Source of Specimen:  Sigmoid colon and proximal rectum  DISPOSITION OF SPECIMEN:  PATHOLOGY  COUNTS:  YES  PLAN OF CARE: Admit to inpatient   PATIENT DISPOSITION:  PACU - hemodynamically stable.  INDICATION:    74 y.o. M with colovesical fistula.  I recommended segmental resection:  The anatomy & physiology of the digestive tract was discussed.  The pathophysiology was discussed.  Natural history risks without surgery was discussed.   I worked to give an overview of the disease and the frequent need to have multispecialty involvement.  I feel the risks of no intervention will lead to serious problems that outweigh the operative risks; therefore, I recommended a partial colectomy to remove the pathology.  Laparoscopic & open techniques were discussed.   Risks such as bleeding, infection, abscess, leak, reoperation, possible ostomy, hernia, heart attack, death, and other risks were discussed.  I noted a good likelihood this will help address the problem.   Goals of post-operative recovery were discussed as well.    The patient expressed understanding & wished to proceed with surgery.  OR FINDINGS:   Patient had significant diverticular disease with abscess  and purulence in the L pelvis.    The anastomosis rests 10 cm from the anal verge by rigid proctoscopy.  DESCRIPTION:   Informed consent was confirmed.  The patient underwent general anaesthesia without difficulty.  The patient was positioned appropriately.  VTE prevention in place.  The patient's abdomen was clipped, prepped, & draped in a sterile fashion.  Surgical timeout confirmed our plan.  The patient was positioned in reverse Trendelenburg.  Abdominal entry was gained using a Varies needle in the LUQ.  Entry was clean.  I induced carbon dioxide insufflation.  An 39mm robotic port was placed in the RUQ.  Camera inspection revealed no injury.  Extra ports were carefully placed under direct laparoscopic visualization.  I laparoscopically reflected the greater omentum and the upper abdomen the small bowel in the upper abdomen. The patient was appropriately positioned and the robot was docked to the patient's left side.  Instruments were placed under direct visualization.    I mobilized the sigmoid colon off of the pelvic sidewall.  There was significant lesions to the left pelvic sidewall.  Significant care was used to take down these adhesions.  I could see the left ureter via his firefly injection and stayed away from this.  I continued down into the pelvis.  There was a loop of sigmoid colon that was adherent to the rectum.  This was dissected free and a abscess cavity was encountered with purulence noted.  Once the entire colon was freed from the left pelvic sidewall, I elevated the colon upwards towards the abdominal wall.   I scored the base of peritoneum of the  right side of the mesentery of the left colon from the ligament of Treitz to the peritoneal reflection of the mid rectum.  I elevated the sigmoid mesentery and enetered into the retro-mesenteric plane. We were able to identify the left ureter and gonadal vessels. We kept those posterior within the retroperitoneum and elevated the left colon  mesentery off that. I did isolated IMA pedicle but did not ligate it yet.  I continued distally and got into the avascular plane posterior to the mesorectum. This allowed me to help mobilize the rectum as well by freeing the mesorectum off the sacrum.  I mobilized the peritoneal coverings towards the peritoneal reflection on both the right and left sides of the rectum.  I could see the right and left ureters and stayed away from them.    I skeletonized the inferior mesenteric artery pedicle.  After confirming the left ureter was out of the way, I went ahead and ligated the inferior mesenteric artery pedicle with bipolar robotic vessel sealer well above its takeoff from the aorta.  We ensured hemostasis. I skeletonized the mesorectum at the junction at the proximal rectum using blunt dissection & bipolar robotic vessel sealer.  I mobilized the left colon in a lateral to medial fashion off the line of Toldt up towards the splenic flexure to ensure good mobilization of the left colon to reach into the pelvis.  We then transected the rectosigmoid colon using the robotic stapler, 60 mm green load.  We then insufflated the bladder with approximately 400 mL of saline to check for a leak in the bladder wall.  The bladder distended well with no sign of leak.  This was then removed from the bladder via the Foley catheter.  5 mL of firefly was injected intravenously to assess for vascular perfusion.  There was good perfusion down to the level of transected mesentery at the descending colon.  The distal rectum and mid rectum also had good perfusion.  I was concerned about the proximal rectum.  The robot was then undocked.  The 12 mm suprapubic port was enlarged to a Pfannenstiel incision and an Alexis wound protector was placed.  The colon was brought out and divided proximally.  This was done on over a pursestring device and a 2-0 Prolene pursestring.  This was secured with 3-0 silk sutures and a 31 mm EEA anvil was placed  into the colon and the pursestring was tied tightly around this.  This was then placed back into the abdomen and the abdomen was reinsufflated.  I then evaluated the proximal rectum where the abscess cavity was located.  We decided that this rectal wall was too damaged by the abscess cavity to create an anastomosis.  Therefore laparoscopically we divided the proximal rectum below this area.  We then introduced a 31 mm EEA stapler into the rectum and brought this out through the distal staple line.  An anastomosis was created.  There was no tension.  There was no leak when tested with insufflation under irrigation.  Anastomotic rings were intact.  The abdomen was irrigated.  Hemostasis was good.  The omentum was brought down over the abdominal contents and the laparoscopic instruments were removed.  We then switched to clean gowns, gloves, instruments and drapes.  The peritoneum of the Pfannenstiel incision was closed using a running 0 Vicryl suture.  The fascia was then reapproximated using #1 Novafil interrupted sutures.  The subcutaneous tissue was closed using a running 2-0 Vicryl suture and the skin  was closed using a running 4-0 Vicryl subcuticular suture.  Sterile dressing was applied.  Dermabond and 4-0 Vicryl sutures were used to close the port sites.  The patient was then awakened from anesthesia and sent to the postanesthesia care unit in stable condition.  All counts were correct per operating room staff.  An MD assistant was necessary for tissue manipulation, retraction and positioning due to the complexity of the case and hospital policies

## 2020-01-31 NOTE — H&P (Addendum)
The patient is a 74 year old male who presents with colovesical fistula. 74 year old male with history of aortic aneurysm endovascular repair and stage III kidney disease, who presented to the hospital with severe diarrhea for approximately 2 months. Workup in the ED showed positive UTI. He reports he has been having intermittent symptoms for approximately 2 years with a 20 pound weight loss. CT scan was concerning for colovesicular fistula. He has recently underwent a colonoscopy which shows no sign of malignancy. He continues without air from the bladder. He denies any pain. He continues to have some weight loss despite eating, high-calorie diet. Past surgical history consistent for ruptured appendicitis.    Past Surgical History  Appendectomy  Diagnostic Studies History Colonoscopy within last year  Allergies No Known Drug Allergies [12/03/2019]: Allergies Reconciled  Medication History  Aspirin (81MG  Tablet, Oral) Active. Allegra (180MG  Tablet, Oral) Active. Aricept (10MG  Tablet, Oral) Active. Imodium (2MG  Capsule, Oral) Active. Amoxicillin-Pot Clavulanate (500-125MG  Tablet, Oral) Active. Probiotic (250MG  Capsule, Oral) Active. Medications Reconciled  Social History  Alcohol use Occasional alcohol use. Caffeine use Tea. Tobacco use Current some day smoker.  Other Problems  Gastroesophageal Reflux Disease Vascular Disease   Review of Systems  General Present- Weight Loss. Not Present- Appetite Loss, Chills, Fatigue, Fever, Night Sweats and Weight Gain. Skin Not Present- Change in Wart/Mole, Dryness, Hives, Jaundice, New Lesions, Non-Healing Wounds, Rash and Ulcer. HEENT Present- Seasonal Allergies and Wears glasses/contact lenses. Not Present- Earache, Hearing Loss, Hoarseness, Nose Bleed, Oral Ulcers, Ringing in the Ears, Sinus Pain, Sore Throat, Visual Disturbances and Yellow Eyes. Respiratory Not Present- Bloody sputum, Chronic Cough,  Difficulty Breathing, Snoring and Wheezing. Breast Not Present- Breast Mass, Breast Pain, Nipple Discharge and Skin Changes. Cardiovascular Not Present- Chest Pain, Difficulty Breathing Lying Down, Leg Cramps, Palpitations, Rapid Heart Rate, Shortness of Breath and Swelling of Extremities. Male Genitourinary Not Present- Blood in Urine, Change in Urinary Stream, Frequency, Impotence, Nocturia, Painful Urination, Urgency and Urine Leakage. Musculoskeletal Not Present- Back Pain, Joint Pain, Joint Stiffness, Muscle Pain, Muscle Weakness and Swelling of Extremities. Neurological Present- Decreased Memory. Not Present- Fainting, Headaches, Numbness, Seizures, Tingling, Tremor, Trouble walking and Weakness. Psychiatric Not Present- Anxiety, Bipolar, Change in Sleep Pattern, Depression, Fearful and Frequent crying. Endocrine Not Present- Cold Intolerance, Excessive Hunger, Hair Changes, Heat Intolerance, Hot flashes and New Diabetes.  BP 130/63   Pulse 60   Temp 97.7 F (36.5 C) (Oral)   Resp 18   Wt 67.8 kg   SpO2 100%   BMI 23.40 kg/m     Physical Exam   General Mental Status-Alert. General Appearance-Cooperative. CV: RRR Lungs:CTA Abdomen Palpation/Percussion Palpation and Percussion of the abdomen reveal - Soft and Non Tender.    Assessment & Plan   COLO-VESICAL FISTULA (N32.1) Impression: A 74 year old male who presents to the office with a colovesical fistula. She is on chronic antibiotics for this and is currently asymptomatic.  He has been gaining some weight over the past 4 weeks. We will plan on having firefly injected into each ureter prior to surgery for better identification during dissection. Discussed the possibility of bladder repair and the need for postoperative catheter. The surgery and anatomy were described to the patient as well as the risks of surgery and the possible complications. These include: Bleeding, deep abdominal infections and possible  wound complications such as hernia and infection, damage to adjacent structures, leak of surgical connections, which can lead to other surgeries and possibly an ostomy, possible need for other procedures, such  as abscess drains in radiology, possible prolonged hospital stay, possible diarrhea from removal of part of the colon, possible constipation from narcotics, possible bowel, bladder or sexual dysfunction if having rectal surgery, prolonged fatigue/weakness or appetite loss, possible early recurrence of of disease, possible complications of their medical problems such as heart disease or arrhythmias or lung problems, death (less than 1%). I believe the patient understands and wishes to proceed with the surgery.

## 2020-01-31 NOTE — Op Note (Signed)
Operative Note  Preoperative diagnosis:  1.  Colovesicular fistula   Postoperative diagnosis: 1.  Colovesicular fistula   Procedure(s): 1.  Cystoscopy 2. Dilation of urethral stricture 3. Injection of firefly IC-green into bilateral ureters  Surgeon: Jettie Pagan, MD  Assistants:  None  Anesthesia:  General  Complications:  None  EBL:  minimal  Specimens: 1. * No specimens in log * None  Drains/Catheters: 1.  16 Fr foley catheter  Intraoperative findings:   1. Approximately 15 Fr bulbar urethral stricture dilated with urethral scope  Indication:  [name] Juarez is a 74 y.o. male with a history of colovesicular fistula here for partial colectomy and repair of fistula.  Urology has been requested to place firefly injections into bilateral ureters.  Patient denies any previous stone history or urinary tract infections.  After thorough discussion including all relevant risk benefits and alternatives, he presents the operating today for his procedure.  Description of procedure: The patient was taken to the operating room and general anesthesia was induced.  The patient was placed in the dorsal lithotomy position, prepped and draped in the usual sterile fashion, and preoperative antibiotics were administered. A preoperative time-out was performed.   Cystourethroscopy was performed.  The patient's urethra was examined identified a approximately 15 French bulbar urethral stricture.  He had no symptom obstructing prostate.  I dilated the urethral stricture with the scope. The bladder was then systematically examined in its entirety. There was no evidence for any bladder tumors, stones, or other mucosal pathology.    Attention then turned to the right ureteral orifice and a 0.038 sensor wire was used to intubate the ureteral orifice.  Advanced a 5 French open catheter up to right kidney.  I then injected 7.5 mls of firefly ICG into the ureter in a retrograde fashion.  This procedure was also  performed in the left ureter.  The scope was removed.  A Foley catheter was placed.  My portion of procedure ended and was turned over to the general surgery team for completion of the case.    Matt R. Davyd Podgorski MD [facility]  Pager: [phone]

## 2020-01-31 NOTE — Consult Note (Signed)
Urology Preoperative H&P   Chief Complaint: colovesicular fistula  History of Present Illness: [name] Juarez is a 74 y.o. male with a colovesicular fistula here for partial colectomy with general surgery. Urology has been requested to place firefly injections into b/l ureters. Pt denies prior GU history, no nephrolithiasis, no hx of UTIs.    Past Medical History:  Diagnosis Date  . Arthritis   . Colon polyp 10/2006  . Erectile dysfunction   . Hyperlipidemia   . Hypertension   . Inguinal hernia    Left  . Tuberculosis    age 49  . Varicose veins     Past Surgical History:  Procedure Laterality Date  . ABDOMINAL AORTIC ENDOVASCULAR STENT GRAFT  11/03/2011  . APPENDECTOMY    . COLONOSCOPY    . INGUINAL HERNIA REPAIR Right   . KNEE SURGERY Right   . LAPAROSCOPIC APPENDECTOMY N/A 08/03/2013   Procedure: APPENDECTOMY LAPAROSCOPIC;  Surgeon: Velora Heckler, MD;  Location: [facility];  Service: General;  Laterality: N/A;    Allergies: No Known Allergies  Family History  Problem Relation Age of Onset  . Tuberculosis Father   . Tuberculosis Sister     Social History:  reports that he quit smoking about 8 years ago. His smoking use included cigarettes. He quit after 45.00 years of use. He has never used smokeless tobacco. He reports current alcohol use of about 25.0 standard drinks of alcohol per week. He reports that he does not use drugs.  ROS: A complete review of systems was performed.  All systems are negative except for pertinent findings as noted.  Physical Exam:  Vital signs in last 24 hours: Temp:  [97.7 F (36.5 C)] 97.7 F (36.5 C) (10/28 0904) Pulse Rate:  [60] 60 (10/28 0904) Resp:  [18] 18 (10/28 0904) BP: (130)/(63) 130/63 (10/28 0904) SpO2:  [100 %] 100 % (10/28 0904) Weight:  [67.8 kg] 67.8 kg (10/28 0921) Constitutional:  Alert and oriented, No acute distress Cardiovascular: Regular rate and rhythm Respiratory: Normal respiratory effort, Lungs clear  bilaterally GI: Abdomen is soft, nontender, nondistended, no abdominal masses GU: No CVA tenderness Lymphatic: No lymphadenopathy Neurologic: Grossly intact, no focal deficits Psychiatric: Normal mood and affect  Laboratory Data:  No results for input(s): WBC, HGB, HCT, PLT in the last 72 hours.  No results for input(s): NA, K, CL, GLUCOSE, BUN, CALCIUM, CREATININE in the last 72 hours.  Invalid input(s): CO3   No results found for this or any previous visit (from the past 24 hour(s)). Recent Results (from the past 240 hour(s))  SARS CORONAVIRUS 2 (TAT 6-24 HRS) Nasopharyngeal Nasopharyngeal Swab     Status: None   Collection Time: 01/28/20  2:02 PM   Specimen: Nasopharyngeal Swab  Result Value Ref Range Status   SARS Coronavirus 2 NEGATIVE NEGATIVE Final    Comment: (NOTE) SARS-CoV-2 target nucleic acids are NOT DETECTED.  The SARS-CoV-2 RNA is generally detectable in upper and lower respiratory specimens during the acute phase of infection. Negative results do not preclude SARS-CoV-2 infection, do not rule out co-infections with other pathogens, and should not be used as the sole basis for treatment or other patient management decisions. Negative results must be combined with clinical observations, patient history, and epidemiological information. The expected result is Negative.  Fact Sheet for Patients: HairSlick.no  Fact Sheet for Healthcare Providers: quierodirigir.com  This test is not yet approved or cleared by the [place] FDA and  has been authorized for detection and/or diagnosis  of SARS-CoV-2 by FDA under an Emergency Use Authorization (EUA). This EUA will remain  in effect (meaning this test can be used) for the duration of the COVID-19 declaration under Se ction 564(b)(1) of the Act, 21 U.S.C. section 360bbb-3(b)(1), unless the authorization is terminated or revoked sooner.  Performed at [facility], [address], [place], [place] [address]     Renal Function: No results for input(s): CREATININE in the last 168 hours. Estimated Creatinine Clearance: 42.3 mL/min (A) (by C-G formula based on SCr of 1.41 mg/dL (H)).  Radiologic Imaging: No results found.  I independently reviewed the above imaging studies.  Assessment and Plan [name] Juarez is a 74 y.o. male with colovesicular fistula here for cysto, b/l ureteral firefly injection and colovesicular repair with general surgery.  -The risks, benefits and alternatives of cystoscopy with firefly instillation, possible ureteral catheter placement was discussed with the patient.  Risks include, but are not limited to: bleeding, urinary tract infection, ureteral injury, ureteral stricture disease, chronic pain, urinary symptoms, bladder injury, stent migration, the need for nephrostomy tube placement, MI, CVA, DVT, PE and the inherent risks with general anesthesia.  The patient voices understanding and wishes to proceed.    Matt R. Arhianna Ebey MD 01/31/2020, 11:01 AM  [facility] Pager: [phone]): [phone]

## 2020-02-01 ENCOUNTER — Encounter (HOSPITAL_COMMUNITY): Payer: Self-pay | Admitting: General Surgery

## 2020-02-01 LAB — BASIC METABOLIC PANEL
Anion gap: 7 (ref 5–15)
BUN: 22 mg/dL (ref 8–23)
CO2: 21 mmol/L — ABNORMAL LOW (ref 22–32)
Calcium: 8.4 mg/dL — ABNORMAL LOW (ref 8.9–10.3)
Chloride: 99 mmol/L (ref 98–111)
Creatinine, Ser: 1.69 mg/dL — ABNORMAL HIGH (ref 0.61–1.24)
GFR, Estimated: 42 mL/min — ABNORMAL LOW (ref >60)
Glucose, Bld: 181 mg/dL — ABNORMAL HIGH (ref 70–99)
Potassium: 5 mmol/L (ref 3.5–5.1)
Sodium: 127 mmol/L — ABNORMAL LOW (ref 135–145)

## 2020-02-01 LAB — CBC
HCT: 34.3 % — ABNORMAL LOW (ref 39.0–52.0)
Hemoglobin: 11.6 g/dL — ABNORMAL LOW (ref 13.0–17.0)
MCH: 31.8 pg (ref 26.0–34.0)
MCHC: 33.8 g/dL (ref 30.0–36.0)
MCV: 94 fL (ref 80.0–100.0)
Platelets: 252 10*3/uL (ref 150–400)
RBC: 3.65 MIL/uL — ABNORMAL LOW (ref 4.22–5.81)
RDW: 13.2 % (ref 11.5–15.5)
WBC: 13.3 10*3/uL — ABNORMAL HIGH (ref 4.0–10.5)
nRBC: 0 % (ref 0.0–0.2)

## 2020-02-01 MED ORDER — CHLORHEXIDINE GLUCONATE CLOTH 2 % EX PADS
6.0000 | MEDICATED_PAD | Freq: Every day | CUTANEOUS | Status: DC
  Administered 2020-02-01: 6 via TOPICAL

## 2020-02-01 MED ORDER — GABAPENTIN 300 MG PO CAPS
300.0000 mg | ORAL_CAPSULE | Freq: Every day | ORAL | Status: DC
  Administered 2020-02-01 – 2020-02-02 (×2): 300 mg via ORAL
  Filled 2020-02-01 (×3): qty 1

## 2020-02-01 MED ORDER — SODIUM CHLORIDE 0.9 % IV SOLN: INTRAVENOUS | Status: DC

## 2020-02-01 NOTE — Progress Notes (Signed)
1 Day Post-Op robotic LAR Subjective: Denies pain or nausea.  Hasn't ambulated yet.  No flatus or BM  Objective: Vital signs in last 24 hours: Temp:  [96.1 F (35.6 C)-98.6 F (37 C)] 97.5 F (36.4 C) (10/29 0527) Pulse Rate:  [49-68] 55 (10/29 0527) Resp:  [10-20] 17 (10/29 0527) BP: (99-130)/(50-93) 114/60 (10/29 0527) SpO2:  [92 %-100 %] 95 % (10/29 0527) Weight:  [67.8 kg-69.6 kg] 69.6 kg (10/29 0527)   Intake/Output from previous day: 10/28 0701 - 10/29 0700 In: 3515.3 [P.O.:740; I.V.:2675.3; IV Piggyback:100] Out: 675 [Urine:650; Blood:25] Intake/Output this shift: No intake/output data recorded.   General appearance: alert and cooperative GI: normal findings: soft, non-tender and mildly distended  Incision: no significant drainage  Lab Results:  Recent Labs    02/01/20 0428  WBC 13.3*  HGB 11.6*  HCT 34.3*  PLT 252   BMET Recent Labs    02/01/20 0428  NA 127*  K 5.0  CL 99  CO2 21*  GLUCOSE 181*  BUN 22  CREATININE 1.69*  CALCIUM 8.4*   PT/INR No results for input(s): LABPROT, INR in the last 72 hours. ABG No results for input(s): PHART, HCO3 in the last 72 hours.  Invalid input(s): PCO2, PO2  MEDS, Scheduled . acetaminophen  1,000 mg Oral Q6H  . alvimopan  12 mg Oral BID  . Chlorhexidine Gluconate Cloth  6 each Topical Daily  . donepezil  10 mg Oral Daily  . enoxaparin (LOVENOX) injection  40 mg Subcutaneous Q24H  . feeding supplement  237 mL Oral BID BM  . gabapentin  300 mg Oral BID  . loratadine  10 mg Oral Daily  . saccharomyces boulardii  250 mg Oral BID    Studies/Results: No results found.  Assessment: s/p Procedure(s): ROBOTIC LOW ANTERIOR RESECTION FIREFLY INJECTION Patient Active Problem List   Diagnosis Date Noted  . Colovesical fistula 11/14/2019  . Acute renal failure - resolving 08/08/2013  . Hypertension   . Acute appendicitis with peritonitis, perforation s/p lap appy 08/03/2013 08/03/2013  . Abdominal aneurysm  without mention of rupture 09/28/2011  . Varicose veins of lower extremities with other complications 04/27/2011    Expected post op course  Plan: Advance diet as tolerated Hyponatremia, hpyerkalemia and increased Cr: will cont IVF's today (w/o K) and recheck labs in AM PT/Ambulate in hall Wean O2  LOS: 1 day     .Vanita Panda, MD [facility], [place]    02/01/2020 7:51 AM

## 2020-02-01 NOTE — Evaluation (Signed)
Physical Therapy Evaluation Patient Details Name: [name] Juarez MRN: [id number] DOB: 1945-11-26 Today's Date: 02/01/2020   History of Present Illness  74 y.o. male with a colovesicular fistula here for partial colectomy with general surgery. Urology has been requested to place firefly injections into b/l ureters. PMH: AAA repair, CKD III, sycope  Clinical Impression  Pt admitted with above diagnosis.  Pt quite pleasant and  Agreeable to PT. amb ~ 220' with IV pole and min to min/guard for balance and safety. Continue to follow in acute setting Pt currently with functional limitations due to the deficits listed below (see PT Problem List). Pt will benefit from skilled PT to increase their independence and safety with mobility to allow discharge to the venue listed below.       Follow Up Recommendations No PT follow up    Equipment Recommendations  Other (comment) (TBD)    Recommendations for Other Services       Precautions / Restrictions Precautions Precautions: Fall Restrictions Weight Bearing Restrictions: No      Mobility  Bed Mobility Overal bed mobility: Needs Assistance Bed Mobility: Rolling;Sidelying to Sit;Sit to Sidelying Rolling: Min guard Sidelying to sit: Min guard;Min assist     Sit to sidelying: Min assist General bed mobility comments: light assist to elevate trunk and bring LEs on to bed, incr time, cues to log roll    Transfers Overall transfer level: Needs assistance Equipment used: Rolling walker (2 wheeled) Transfers: Sit to/from Stand Sit to Stand: Min assist;Min guard         General transfer comment: cues for hadn placement  Ambulation/Gait Ambulation/Gait assistance: Min guard;Min assist Gait Distance (Feet): 220 Feet Assistive device: IV Pole;1 person hand held assist Gait Pattern/deviations: Step-through pattern;Narrow base of support;Decreased stride length     General Gait Details: unsteady initially however stability improved with  distance. pt reports his stride/step length limited d/t hernia pain  Stairs            Wheelchair Mobility    Modified Rankin (Stroke Patients Only)       Balance Overall balance assessment: Needs assistance Sitting-balance support: No upper extremity supported;Feet supported Sitting balance-Leahy Scale: Good       Standing balance-Leahy Scale: Fair                               Pertinent Vitals/Pain Pain Assessment:  (initially denied pain, later come c/o pain d/t hernia)    Home Living Family/patient expects to be discharged to:: Private residence Living Arrangements: Spouse/significant other Available Help at Discharge: Family;Available 24 hours/day Type of Home: House Home Access: Stairs to enter Entrance Stairs-Rails: None Entrance Stairs-Number of Steps: 2 Home Layout: One level Home Equipment: Shower seat - built in;Cane - quad      Prior Function Level of Independence: Independent               Hand Dominance        Extremity/Trunk Assessment   Upper Extremity Assessment Upper Extremity Assessment: Overall WFL for tasks assessed    Lower Extremity Assessment Lower Extremity Assessment: Overall WFL for tasks assessed       Communication   Communication: No difficulties  Cognition Arousal/Alertness: Awake/alert Behavior During Therapy: WFL for tasks assessed/performed Overall Cognitive Status: Within Functional Limits for tasks assessed  General Comments      Exercises     Assessment/Plan    PT Assessment Patient needs continued PT services  PT Problem List Decreased mobility;Decreased activity tolerance;Decreased balance       PT Treatment Interventions DME instruction;Therapeutic exercise;Gait training;Therapeutic activities;Patient/family education    PT Goals (Current goals can be found in the Care Plan section)  Acute Rehab PT Goals Patient Stated  Goal: home soon PT Goal Formulation: With patient Time For Goal Achievement: 02/15/20 Potential to Achieve Goals: Good    Frequency Min 3X/week   Barriers to discharge        Co-evaluation               AM-PAC PT "6 Clicks" Mobility  Outcome Measure Help needed turning from your back to your side while in a flat bed without using bedrails?: A Little Help needed moving from lying on your back to sitting on the side of a flat bed without using bedrails?: A Little Help needed moving to and from a bed to a chair (including a wheelchair)?: A Little Help needed standing up from a chair using your arms (e.g., wheelchair or bedside chair)?: A Little Help needed to walk in hospital room?: A Little Help needed climbing 3-5 steps with a railing? : A Little 6 Click Score: 18    End of Session   Activity Tolerance: Patient tolerated treatment well Patient left: in bed;with call bell/phone within reach;with family/visitor present   PT Visit Diagnosis: Difficulty in walking, not elsewhere classified (R26.2)    Time: 1448-1856 PT Time Calculation (min) (ACUTE ONLY): 19 min   Charges:   PT Evaluation $PT Eval Low Complexity: 1 Low          Axell Trigueros, PT  Acute Rehab Dept ([facility]) [phone] Pager [phone]  02/01/2020   [facility] 02/01/2020, 4:37 PM

## 2020-02-01 NOTE — Discharge Instructions (Signed)

## 2020-02-02 LAB — CBC
HCT: 34.3 % — ABNORMAL LOW (ref 39.0–52.0)
Hemoglobin: 11.3 g/dL — ABNORMAL LOW (ref 13.0–17.0)
MCH: 31.2 pg (ref 26.0–34.0)
MCHC: 32.9 g/dL (ref 30.0–36.0)
MCV: 94.8 fL (ref 80.0–100.0)
Platelets: 238 10*3/uL (ref 150–400)
RBC: 3.62 MIL/uL — ABNORMAL LOW (ref 4.22–5.81)
RDW: 13.4 % (ref 11.5–15.5)
WBC: 7.8 10*3/uL (ref 4.0–10.5)
nRBC: 0 % (ref 0.0–0.2)

## 2020-02-02 LAB — BASIC METABOLIC PANEL
Anion gap: 6 (ref 5–15)
BUN: 18 mg/dL (ref 8–23)
CO2: 21 mmol/L — ABNORMAL LOW (ref 22–32)
Calcium: 8.3 mg/dL — ABNORMAL LOW (ref 8.9–10.3)
Chloride: 110 mmol/L (ref 98–111)
Creatinine, Ser: 1.53 mg/dL — ABNORMAL HIGH (ref 0.61–1.24)
GFR, Estimated: 47 mL/min — ABNORMAL LOW (ref >60)
Glucose, Bld: 112 mg/dL — ABNORMAL HIGH (ref 70–99)
Potassium: 4.4 mmol/L (ref 3.5–5.1)
Sodium: 137 mmol/L (ref 135–145)

## 2020-02-02 NOTE — Progress Notes (Signed)
Physical Therapy Treatment Patient Details Name: [name] Juarez MRN: [id number] DOB: 07-26-1945 Today's Date: 02/02/2020    History of Present Illness 74 y.o. male with a colovesicular fistula here for partial colectomy with general surgery. Urology has been requested to place firefly injections into b/l ureters. PMH: AAA repair, CKD III, sycope    PT Comments    Check on pt this pm, reports he has not been up with nursing staff, amb to bathroom with his wife a few times. Pt amb ~ 37' with HHA, agreeable to being in recliner. Pt noted to be coughing with amb,  Reviewed use of IS and pt return demo'd 10 repetitions.  Continue to follow in acute setting. May benefit from return to OPPT to continue to work on higher level balance and conditioning.    Follow Up Recommendations  No PT follow up;Other (comment) (vs return to OPPT)     Equipment Recommendations  None recommended by PT    Recommendations for Other Services       Precautions / Restrictions Precautions Precautions: Fall Restrictions Weight Bearing Restrictions: No    Mobility  Bed Mobility Overal bed mobility: Needs Assistance Bed Mobility: Rolling;Sidelying to Sit Rolling: Min guard Sidelying to sit: Min assist       General bed mobility comments: assist to elevate trunk   Transfers Overall transfer level: Needs assistance Equipment used: 1 person hand held assist;None Transfers: Sit to/from Stand Sit to Stand: Min guard;Supervision         General transfer comment: for safety   Ambulation/Gait Ambulation/Gait assistance: Min guard;Min Chemical engineer (Feet): 240 Feet Assistive device: 1 person hand held assist;None Gait Pattern/deviations: Step-through pattern;Decreased stride length     General Gait Details: HHA for incr stability (has cane at home), prefers not to use RW however is slightly unsteady without overt LOB    Stairs             Wheelchair Mobility    Modified Rankin  (Stroke Patients Only)       Balance                                            Cognition Arousal/Alertness: Awake/alert Behavior During Therapy: WFL for tasks assessed/performed Overall Cognitive Status: Within Functional Limits for tasks assessed                                        Exercises      General Comments        Pertinent Vitals/Pain Pain Assessment: No/denies pain    Home Living                      Prior Function            PT Goals (current goals can now be found in the care plan section) Acute Rehab PT Goals Patient Stated Goal: home soon PT Goal Formulation: With patient Time For Goal Achievement: 02/15/20 Potential to Achieve Goals: Good Progress towards PT goals: Progressing toward goals    Frequency    Min 3X/week      PT Plan Current plan remains appropriate    Co-evaluation              AM-PAC PT "6 Clicks" Mobility   Outcome  Measure  Help needed turning from your back to your side while in a flat bed without using bedrails?: A Little Help needed moving from lying on your back to sitting on the side of a flat bed without using bedrails?: A Little Help needed moving to and from a bed to a chair (including a wheelchair)?: A Little Help needed standing up from a chair using your arms (e.g., wheelchair or bedside chair)?: A Little Help needed to walk in hospital room?: A Little Help needed climbing 3-5 steps with a railing? : A Little 6 Click Score: 18    End of Session   Activity Tolerance: Patient tolerated treatment well Patient left: in chair;with call bell/phone within reach;with family/visitor present (no alarms on arrival, Michaele Offer has been assisting )   PT Visit Diagnosis: Difficulty in walking, not elsewhere classified (R26.2)     Time: 5701-7793 PT Time Calculation (min) (ACUTE ONLY): 20 min  Charges:  $Gait Training: 8-22 mins                     Delice Bison, PT  Acute  Rehab Dept ([facility]) [phone] Pager [phone]  02/02/2020    [facility] 02/02/2020, 4:16 PM

## 2020-02-02 NOTE — Progress Notes (Signed)
Pharmacy Brief Note - Alvimopan (Entereg)  The standing order set for alvimopan (Entereg) now includes an automatic order to discontinue the drug after the patient has had a bowel movement. The change was approved by the Pharmacy & Therapeutics Committee and the Medical Executive Committee.   This patient has had bowel movements documented by nursing. Therefore, alvimopan has been discontinued. If there are questions, please contact the pharmacy at [phone].   Thank you-  Otho Bellows PharmD [facility] [phone] 02/02/2020, 12:21 PM

## 2020-02-02 NOTE — Progress Notes (Signed)
    2 Days Post-Op  Subjective: Had multiple bowel movements overnight. Creatinine downtrending, sodium normal. Tolerating diet, denies nausea/vomiting.   Objective: Vital signs in last 24 hours: Temp:  [97.6 F (36.4 C)-98.3 F (36.8 C)] 98.1 F (36.7 C) (10/30 0544) Pulse Rate:  [61-70] 65 (10/30 0544) Resp:  [12-18] 17 (10/30 0544) BP: (118-142)/(59-96) 124/65 (10/30 0544) SpO2:  [92 %-96 %] 92 % (10/30 0544) Last BM Date: 01/31/20  Intake/Output from previous day: 10/29 0701 - 10/30 0700 In: 2919.9 [P.O.:1120; I.V.:1799.9] Out: 3575 [Urine:3575] Intake/Output this shift: No intake/output data recorded.  PE: General: resting comfortably, NAD Neuro: alert and oriented, no focal deficits Resp: normal work of breathing Abdomen: soft, nondistended, nontender to palpation. Incisions clean and dry, no erythema or drainage. Extremities: warm and well-perfused   Lab Results:  Recent Labs    02/01/20 0428 02/02/20 0443  WBC 13.3* 7.8  HGB 11.6* 11.3*  HCT 34.3* 34.3*  PLT 252 238   BMET Recent Labs    02/01/20 0428 02/02/20 0443  NA 127* 137  K 5.0 4.4  CL 99 110  CO2 21* 21*  GLUCOSE 181* 112*  BUN 22 18  CREATININE 1.69* 1.53*  CALCIUM 8.4* 8.3*   PT/INR No results for input(s): LABPROT, INR in the last 72 hours. CMP     Component Value Date/Time   NA 137 02/02/2020 0443   K 4.4 02/02/2020 0443   CL 110 02/02/2020 0443   CO2 21 (L) 02/02/2020 0443   GLUCOSE 112 (H) 02/02/2020 0443   BUN 18 02/02/2020 0443   CREATININE 1.53 (H) 02/02/2020 0443   CREATININE 0.97 09/02/2011 0909   CALCIUM 8.3 (L) 02/02/2020 0443   PROT 6.4 (L) 11/15/2019 0130   ALBUMIN 3.5 11/15/2019 0130   AST 17 11/15/2019 0130   ALT 23 11/15/2019 0130   ALKPHOS 48 11/15/2019 0130   BILITOT 1.1 11/15/2019 0130   GFRNONAA 47 (L) 02/02/2020 0443   GFRAA 44 (L) 11/17/2019 0327   Lipase     Component Value Date/Time   LIPASE 53 (H) 11/15/2019 0130        Studies/Results: No results found.  Anti-infectives: Anti-infectives (From admission, onward)   Start     Dose/Rate Route Frequency Ordered Stop   01/31/20 2200  cefoTEtan (CEFOTAN) 2 g in sodium chloride 0.9 % 100 mL IVPB        2 g 200 mL/hr over 30 Minutes Intravenous Every 12 hours 01/31/20 1622 01/31/20 2257   01/31/20 0915  cefoTEtan (CEFOTAN) 2 g in sodium chloride 0.9 % 100 mL IVPB        2 g 200 mL/hr over 30 Minutes Intravenous On call to O.R. 01/31/20 0902 01/31/20 1126       Assessment/Plan 74 yo male with colovesical fistula, POD2 s/p robotic LAR.  - Stop IV fluids - Recheck BMP in am - Continue soft diet - Remove foley VTE: lovenox 40mg  daily Ambulate Dispo: inpatient, anticipate discharge tomorrow if voiding and renal function continues to improve   LOS: 2 days   , MD [facility] General, Hepatobiliary and Pancreatic Surgery 02/02/20 8:33 AM

## 2020-02-03 LAB — BASIC METABOLIC PANEL
Anion gap: 8 (ref 5–15)
BUN: 14 mg/dL (ref 8–23)
CO2: 22 mmol/L (ref 22–32)
Calcium: 8.6 mg/dL — ABNORMAL LOW (ref 8.9–10.3)
Chloride: 109 mmol/L (ref 98–111)
Creatinine, Ser: 1.37 mg/dL — ABNORMAL HIGH (ref 0.61–1.24)
GFR, Estimated: 54 mL/min — ABNORMAL LOW (ref >60)
Glucose, Bld: 110 mg/dL — ABNORMAL HIGH (ref 70–99)
Potassium: 4.3 mmol/L (ref 3.5–5.1)
Sodium: 139 mmol/L (ref 135–145)

## 2020-02-03 LAB — CBC
HCT: 35.1 % — ABNORMAL LOW (ref 39.0–52.0)
Hemoglobin: 11.3 g/dL — ABNORMAL LOW (ref 13.0–17.0)
MCH: 30.9 pg (ref 26.0–34.0)
MCHC: 32.2 g/dL (ref 30.0–36.0)
MCV: 95.9 fL (ref 80.0–100.0)
Platelets: 266 10*3/uL (ref 150–400)
RBC: 3.66 MIL/uL — ABNORMAL LOW (ref 4.22–5.81)
RDW: 13.6 % (ref 11.5–15.5)
WBC: 7.5 10*3/uL (ref 4.0–10.5)
nRBC: 0 % (ref 0.0–0.2)

## 2020-02-03 MED ORDER — TRAMADOL HCL 50 MG PO TABS: 50.0000 mg | ORAL_TABLET | Freq: Four times a day (QID) | ORAL | 0 refills | Status: DC | PRN

## 2020-02-03 MED ORDER — ACETAMINOPHEN 500 MG PO TABS: 1000.0000 mg | ORAL_TABLET | Freq: Four times a day (QID) | ORAL | 0 refills | Status: AC | PRN

## 2020-02-03 NOTE — Discharge Summary (Signed)
Physician Discharge Summary  Patient ID: [name] Juarez MRN: [id number] DOB/AGE: 75-Mar-1947 75 y.o.  Admit date: 01/31/2020 Discharge date: 02/03/2020  Admission Diagnoses: Colovesical fistula  Discharge Diagnoses:  Active Problems:   Colovesical fistula   Discharged Condition: stable  Hospital Course: The patient was taken to the OR on 10/28 for a planned robotic low anterior resection with Dr. Maisie Fus. He also had foley placement by urology with dilation of a urethral stricture. For further details please see separately dictated operative note. Postoperative he was admitted to the floor. His diet was advanced to liquids and then to a soft diet, which he tolerated without difficulty. He had a mild elevation in creatinine as well as hyponatremia, both of which resolved with normal saline fluid hydration. Foley was removed on POD2 and he was able to void spontaneously. He had return of bowel function. By POD3 he was ambulating, tolerating a diet, having bowel function, and pain was well-controlled. He was examined and deemed appropriate for discharge home.  Consults: physical therapy  Significant Diagnostic Studies: none  Treatments: surgery: LAR  Discharge Exam: Blood pressure (!) 141/65, pulse 64, temperature 98.2 F (36.8 C), temperature source Oral, resp. rate 16, height 5\' 7"  (1.702 m), weight 69.4 kg, SpO2 96 %. General appearance: alert, cooperative and appears stated age Head: Normocephalic, without obvious abnormality, atraumatic Resp: nonlabored respirations on room air GI: soft, nontener, nondistended Extremities: extremities normal, atraumatic, no cyanosis or edema Skin: Skin color, texture, turgor normal. No rashes or lesions Neurologic: Grossly normal Incision/Wound: pfannnsteil and lap incision clean and dry with no erythema or drainage  Disposition: Discharge disposition: 01-Home or Self Care       Discharge Instructions    Call MD for:  persistant nausea and  vomiting   Complete by: As directed    Call MD for:  redness, tenderness, or signs of infection (pain, swelling, redness, odor or green/yellow discharge around incision site)   Complete by: As directed    Call MD for:  severe uncontrolled pain   Complete by: As directed    Call MD for:  temperature >100.4   Complete by: As directed    Driving Restrictions   Complete by: As directed    Do not drive while taking narcotic pain medications.   Increase activity slowly   Complete by: As directed      Allergies as of 02/03/2020   No Known Allergies     Medication List    STOP taking these medications   amoxicillin-clavulanate 500-125 MG tablet Commonly known as: AUGMENTIN   diphenoxylate-atropine 2.5-0.025 MG tablet Commonly known as: LOMOTIL   loperamide 2 MG tablet Commonly known as: IMODIUM A-D   metroNIDAZOLE 500 MG tablet Commonly known as: FLAGYL   neomycin 500 MG tablet Commonly known as: MYCIFRADIN     TAKE these medications   acetaminophen 500 MG tablet Commonly known as: TYLENOL Take 2 tablets (1,000 mg total) by mouth every 6 (six) hours as needed for mild pain.   aspirin EC 81 MG tablet Take 81 mg by mouth daily.   donepezil 10 MG tablet Commonly known as: ARICEPT Take 10 mg by mouth daily.   fexofenadine 180 MG tablet Commonly known as: ALLEGRA Take 180 mg by mouth daily.   traMADol 50 MG tablet Commonly known as: ULTRAM Take 1 tablet (50 mg total) by mouth every 6 (six) hours as needed for severe pain.       Follow-up Information    06-22-1989, MD. Schedule an  appointment as soon as possible for a visit in 2 week(s).   Specialty: General Surgery Contact information: [address] [place] [address] [phone]               Signed: Fritzi Mandes 02/03/2020, 8:56 AM

## 2020-02-03 NOTE — Progress Notes (Signed)
Assessment unchanged. Pt and wife verbalized understanding of dc instructions. Discharged via wc to front entrance accompanied by NT and wife.

## 2020-02-04 LAB — SURGICAL PATHOLOGY

## 2020-04-08 DIAGNOSIS — R059 Cough, unspecified: Secondary | ICD-10-CM | POA: Diagnosis not present

## 2020-04-09 ENCOUNTER — Encounter: Payer: Self-pay | Admitting: Surgery

## 2020-04-09 ENCOUNTER — Ambulatory Visit: Payer: Self-pay | Admitting: Surgery

## 2020-04-09 DIAGNOSIS — K4090 Unilateral inguinal hernia, without obstruction or gangrene, not specified as recurrent: Secondary | ICD-10-CM | POA: Diagnosis not present

## 2020-04-09 NOTE — H&P (Signed)
[name] Juarez Appointment: 04/09/2020 9:30 AM Location: [facility] Patient #: [id number] DOB: [birth year] Married / Language: [place] / Race: White Male  History of Present Illness Ardeth Sportsman MD; 04/09/2020 10:14 AM) The patient is a 75 year old male who presents with an inguinal hernia. Note for "Inguinal hernia": ` ` ` Patient sent for surgical consultation at the request of Dr Maisie Fus  Chief Complaint: Worsening left inguinal hernia. ` ` The patient is a pleasant active male originally from [place], [place]. He and his wife have been the [place] since the 1970s. He developed bowel changes pain and diarrhea and found to have a colovesical fistula. He underwent robotic surgery robotic low anterior resection and takedown 01/31/2020. Pathology consistent with diverticulitis. Inguinal hernia noted at the time of surgery. Did not seem particularly large. However on postop follow-up he noted he was getting larger and more uncomfortable. Referral offered to consider repair.  Patient notes he's having more sensitivity. He is wearing a truss and that helps a lot. Occasional over-the-counter medications. It done through rehabilitation and it got more sensitive than. He is walking about 10-15 minutes without difficulty. Getting stronger. No problems with urination. He is moving his bowels pretty regularly now. Risk of diarrhea much last. No fevers or chills. Incisions are healed well. Denies any history of infections or other problems. He had a right inguinal hernia repaired in an open fashion about 10 years ago. He believes its in town but I cannot find any records. Does not smoke. No diabetes. No blood thinners. No cardiac or pulmonary issues that he is aware of.  (Review of systems as stated in this history (HPI) or in the review of systems. Otherwise all other 12 point ROS are negative) ` ` ###########################################`  This patient encounter took 35  minutes today to perform the following: obtain history, perform exam, review outside records, interpret tests & imaging, counsel the patient on their diagnosis; and, document this encounter, including findings & plan in the electronic health record (EHR).   Allergies Jaynee Eagles, CMA; 04/09/2020 9:32 AM) No Known Drug Allergies [12/03/2019]: Allergies Reconciled  Medication History Jaynee Eagles, CMA; 04/09/2020 9:33 AM) Joyce Copa (180MG  Tablet, Oral) Active. Aricept (10MG  Tablet, Oral) Active. Imodium (2MG  Capsule, Oral) Active. Probiotic (250MG  Capsule, Oral) Active. Tylenol (500MG  Capsule, 1 (one) Oral) Active. Medications Reconciled    Vitals CMA; 04/09/2020 9:33 AM) 04/09/2020 9:33 AM Weight: 153 lb Height: 68in Body Surface Area: 1.82 m Body Mass Index: 23.26 kg/m  Temp.: 97.28F  Pulse: 74 (Regular)  P.OX: 98% (Room air) BP: 138/78(Sitting, Left Arm, Standard)        Physical Exam MD; 04/09/2020 10:11 AM)  General Mental Status-Alert. General Appearance-Not in acute distress, Not Sickly. Orientation-Oriented X3. Hydration-Well hydrated. Voice-Normal. Note: Somewhat slow but steady gait. Transfers with minimal assist well.  Integumentary Global Assessment Upon inspection and palpation of skin surfaces of the - Axillae: non-tender, no inflammation or ulceration, no drainage. and Distribution of scalp and body hair is normal. General Characteristics Temperature - normal warmth is noted.  Head and Neck Head-normocephalic, atraumatic with no lesions or palpable masses. Face Global Assessment - atraumatic, no absence of expression. Neck Global Assessment - no abnormal movements, no bruit auscultated on the right, no bruit auscultated on the left, no decreased range of motion, non-tender. Trachea-midline. Thyroid Gland Characteristics - non-tender.  Eye Eyeball - Left-Extraocular movements  intact, No Nystagmus - Left. Eyeball - Right-Extraocular movements intact, No Nystagmus - Right.  Cornea - Left-No Hazy - Left. Cornea - Right-No Hazy - Right. Sclera/Conjunctiva - Left-No scleral icterus, No Discharge - Left. Sclera/Conjunctiva - Right-No scleral icterus, No Discharge - Right. Pupil - Left-Direct reaction to light normal. Pupil - Right-Direct reaction to light normal.  ENMT Ears Pinna - Left - no drainage observed, no generalized tenderness observed. Pinna - Right - no drainage observed, no generalized tenderness observed. Nose and Sinuses External Inspection of the Nose - no destructive lesion observed. Inspection of the nares - Left - quiet respiration. Inspection of the nares - Right - quiet respiration. Mouth and Throat Lips - Upper Lip - no fissures observed, no pallor noted. Lower Lip - no fissures observed, no pallor noted. Nasopharynx - no discharge present. Oral Cavity/Oropharynx - Tongue - no dryness observed. Oral Mucosa - no cyanosis observed. Hypopharynx - no evidence of airway distress observed.  Chest and Lung Exam Inspection Movements - Normal and Symmetrical. Accessory muscles - No use of accessory muscles in breathing. Palpation Palpation of the chest reveals - Non-tender. Auscultation Breath sounds - Normal and Clear.  Cardiovascular Auscultation Rhythm - Regular. Murmurs & Other Heart Sounds - Auscultation of the heart reveals - No Murmurs and No Systolic Clicks.  Abdomen Inspection Inspection of the abdomen reveals - No Visible peristalsis and No Abnormal pulsations. Umbilicus - No Bleeding, No Urine drainage. Palpation/Percussion Palpation and Percussion of the abdomen reveal - Soft, Non Tender, No Rebound tenderness, No Rigidity (guarding) and No Cutaneous hyperesthesia. Note: Abdomen soft. Thin-walled. Moderate suprapubic umbilical diastases recti but no hernia Nontender. Not distended. No umbilical or incisional hernias.  No guarding.  Male Genitourinary Sexual Maturity Tanner 5 - Adult hair pattern and Adult penile size and shape. Note: Patient wearing truss. Obvious left groin bulge consistent with sensitive but reducible left inguinal hernia.  Scarring and right groin but no evidence of any recurrent radial hernia. Otherwise uncircumcised male and normal external genitalia  Peripheral Vascular Upper Extremity Inspection - Left - No Cyanotic nailbeds - Left, Not Ischemic. Inspection - Right - No Cyanotic nailbeds - Right, Not Ischemic.  Neurologic Neurologic evaluation reveals -normal attention span and ability to concentrate, able to name objects and repeat phrases. Appropriate fund of knowledge , normal sensation and normal coordination. Mental Status Affect - not angry, not paranoid. Cranial Nerves-Normal Bilaterally. Gait-Normal.  Neuropsychiatric Mental status exam performed with findings of-able to articulate well with normal speech/language, rate, volume and coherence, thought content normal with ability to perform basic computations and apply abstract reasoning and no evidence of hallucinations, delusions, obsessions or homicidal/suicidal ideation.  Musculoskeletal Global Assessment Spine, Ribs and Pelvis - no instability, subluxation or laxity. Right Upper Extremity - no instability, subluxation or laxity.  Lymphatic Head & Neck  General Head & Neck Lymphatics: Bilateral - Description - No Localized lymphadenopathy. Axillary  General Axillary Region: Bilateral - Description - No Localized lymphadenopathy. Femoral & Inguinal  Generalized Femoral & Inguinal Lymphatics: Left - Description - No Localized lymphadenopathy. Right - Description - No Localized lymphadenopathy.    Assessment & Plan Adin Hector MD; 04/09/2020 10:11 AM)  LEFT INGUINAL HERNIA (K40.90) Impression: Obvious left inguinal hernia with sensitivity stabilized with truss.  Continue focusing recovery  with bowel regimen. Tylenol and heat and truss as tolerated to help with sensitivity.  At some point he would benefit from hernia surgery. Because of his surgery for colovaginal fistula only 2 months ago, would like to wait a little bit longer. Would like to wait 3 months from  his colorectal surgery to allow tissue and inflammation to calm down. Try preperitoneal laparoscopic approach with low threshold to do more open approach. Allow diagnostic laparoscopy make sure there is no evidence of any recurrent hernia on the right or new hernias on that side. He is wife are interested in proceeding. Should able to do outpatient setting  He has good performance status and did not have any issues with his colorectal surgery so I do not think further cardiac clearance is needed.  Current Plans Pt Education - [facility] Pain Control (Burle Kwan)  PREOP - ING HERNIA - ENCOUNTER FOR PREOPERATIVE EXAMINATION FOR GENERAL SURGICAL PROCEDURE (Z01.818)  Current Plans You are being scheduled for surgery- Our schedulers will call you.  You should hear from our office's scheduling department within 5 working days about the location, date, and time of surgery. We try to make accommodations for patient's preferences in scheduling surgery, but sometimes the OR schedule or the surgeon's schedule prevents [place] from making those accommodations.  If you have not heard from our office [phone]) in 5 working days, call the office and ask for your surgeon's nurse.  If you have other questions about your diagnosis, plan, or surgery, call the office and ask for your surgeon's nurse.  Written instructions provided Pt Education - Pamphlet Given - Laparoscopic Hernia Repair: discussed with patient and provided information. The anatomy & physiology of the abdominal wall and pelvic floor was discussed. The pathophysiology of hernias in the inguinal and pelvic region was discussed. Natural history risks such as progressive enlargement,  pain, incarceration, and strangulation was discussed. Contributors to complications such as smoking, obesity, diabetes, prior surgery, etc were discussed.  I feel the risks of no intervention will lead to serious problems that outweigh the operative risks; therefore, I recommended surgery to reduce and repair the hernia. I explained laparoscopic techniques with possible need for an open approach. I noted usual use of mesh to patch and/or buttress hernia repair  Risks such as bleeding, infection, abscess, need for further treatment, heart attack, death, and other risks were discussed. I noted a good likelihood this will help address the problem. Goals of post-operative recovery were discussed as well. Possibility that this will not correct all symptoms was explained. I stressed the importance of low-impact activity, aggressive pain control, avoiding constipation, & not pushing through pain to minimize risk of post-operative chronic pain or injury. Possibility of reherniation was discussed. We will work to minimize complications.  An educational handout further explaining the pathology & treatment options was given as well. Questions were answered. The patient expresses understanding & wishes to proceed with surgery.  Pt Education - [facility] Hernia Post-Op HCI (Omeka Holben): discussed with patient and provided information. Pt Education - [facility] Mesh education: discussed with patient and provided information.

## 2020-04-24 DIAGNOSIS — N183 Chronic kidney disease, stage 3 unspecified: Secondary | ICD-10-CM | POA: Diagnosis not present

## 2020-04-30 DIAGNOSIS — I129 Hypertensive chronic kidney disease with stage 1 through stage 4 chronic kidney disease, or unspecified chronic kidney disease: Secondary | ICD-10-CM | POA: Diagnosis not present

## 2020-04-30 DIAGNOSIS — N2581 Secondary hyperparathyroidism of renal origin: Secondary | ICD-10-CM | POA: Diagnosis not present

## 2020-04-30 DIAGNOSIS — N1832 Chronic kidney disease, stage 3b: Secondary | ICD-10-CM | POA: Diagnosis not present

## 2020-04-30 DIAGNOSIS — R809 Proteinuria, unspecified: Secondary | ICD-10-CM | POA: Diagnosis not present

## 2020-05-12 ENCOUNTER — Other Ambulatory Visit (HOSPITAL_COMMUNITY)
Admission: RE | Admit: 2020-05-12 | Discharge: 2020-05-12 | Disposition: A | Discharge status: Home / Self Care | Payer: Medicare Other | Source: Ambulatory Visit | Attending: Surgery | Admitting: Surgery

## 2020-05-12 DIAGNOSIS — Z01812 Encounter for preprocedural laboratory examination: Secondary | ICD-10-CM | POA: Diagnosis not present

## 2020-05-12 DIAGNOSIS — Z20822 Contact with and (suspected) exposure to covid-19: Secondary | ICD-10-CM | POA: Insufficient documentation

## 2020-05-13 ENCOUNTER — Encounter (HOSPITAL_BASED_OUTPATIENT_CLINIC_OR_DEPARTMENT_OTHER): Payer: Self-pay | Admitting: Surgery

## 2020-05-13 ENCOUNTER — Other Ambulatory Visit: Payer: Self-pay

## 2020-05-13 LAB — SARS CORONAVIRUS 2 (TAT 6-24 HRS): SARS Coronavirus 2: NEGATIVE

## 2020-05-13 NOTE — Progress Notes (Addendum)
Spoke w/ via phone for pre-op interview---pt wife dianne per pt request Lab needs dos----      none         Lab results------chest ct 11-14-2019 epic, echo 8-12-02021 epic, ekg 11-14-2019 epic COVID test ------05-12-2020 negative result in epic Arrive at -------730 am 05-15-2020 NPO after MN NO Solid Food.  Clear liquids from MN until-- drink ensure pre surgery shake  @ 630 am then npo Medications to take morning of surgery -----fexofexadine, donezepril Diabetic medication -----n/a Patient Special Instructions -----none Pre-Op special Istructions -----none Patient verbalized understanding of instructions that were given at this phone interview. Patient denies shortness of breath, chest pain, fever, cough at this phone interview.  lov vascular 07-26-2019 epic lov nephrology dr Thedore Mins 04-30-2020 on chart

## 2020-05-15 ENCOUNTER — Encounter (HOSPITAL_BASED_OUTPATIENT_CLINIC_OR_DEPARTMENT_OTHER)
Admission: RE | Disposition: A | Discharge status: Home / Self Care | Payer: Self-pay | Source: Home / Self Care | Attending: Surgery

## 2020-05-15 ENCOUNTER — Ambulatory Visit (HOSPITAL_BASED_OUTPATIENT_CLINIC_OR_DEPARTMENT_OTHER)
Admission: RE | Admit: 2020-05-15 | Discharge: 2020-05-15 | Disposition: A | Discharge status: Home / Self Care | Payer: Medicare Other | Attending: Surgery | Admitting: Surgery

## 2020-05-15 ENCOUNTER — Encounter (HOSPITAL_BASED_OUTPATIENT_CLINIC_OR_DEPARTMENT_OTHER): Payer: Self-pay | Admitting: Surgery

## 2020-05-15 ENCOUNTER — Ambulatory Visit (HOSPITAL_BASED_OUTPATIENT_CLINIC_OR_DEPARTMENT_OTHER): Payer: Medicare Other | Admitting: Anesthesiology

## 2020-05-15 DIAGNOSIS — E785 Hyperlipidemia, unspecified: Secondary | ICD-10-CM | POA: Diagnosis not present

## 2020-05-15 DIAGNOSIS — K4090 Unilateral inguinal hernia, without obstruction or gangrene, not specified as recurrent: Secondary | ICD-10-CM

## 2020-05-15 DIAGNOSIS — N1831 Chronic kidney disease, stage 3a: Secondary | ICD-10-CM | POA: Diagnosis not present

## 2020-05-15 DIAGNOSIS — I129 Hypertensive chronic kidney disease with stage 1 through stage 4 chronic kidney disease, or unspecified chronic kidney disease: Secondary | ICD-10-CM | POA: Diagnosis not present

## 2020-05-15 DIAGNOSIS — Z87891 Personal history of nicotine dependence: Secondary | ICD-10-CM | POA: Diagnosis not present

## 2020-05-15 HISTORY — DX: Presence of dental prosthetic device (complete) (partial): Z97.2

## 2020-05-15 HISTORY — DX: Personal history of other diseases of the circulatory system: Z86.79

## 2020-05-15 HISTORY — DX: Prediabetes: R73.03

## 2020-05-15 HISTORY — PX: INGUINAL HERNIA REPAIR: SHX194

## 2020-05-15 HISTORY — DX: Peripheral vascular disease, unspecified: I73.9

## 2020-05-15 HISTORY — DX: Chronic kidney disease, stage 3a: N18.31

## 2020-05-15 HISTORY — DX: Unspecified dementia, unspecified severity, without behavioral disturbance, psychotic disturbance, mood disturbance, and anxiety: F03.90

## 2020-05-15 SURGERY — REPAIR, HERNIA, INGUINAL, LAPAROSCOPIC
Anesthesia: General | Laterality: Left

## 2020-05-15 MED ORDER — GLYCOPYRROLATE 0.2 MG/ML IJ SOLN
INTRAMUSCULAR | Status: DC | PRN
  Administered 2020-05-15 (×2): .1 mg via INTRAVENOUS

## 2020-05-15 MED ORDER — CHLORHEXIDINE GLUCONATE CLOTH 2 % EX PADS: 6.0000 | MEDICATED_PAD | Freq: Once | CUTANEOUS | Status: DC

## 2020-05-15 MED ORDER — DEXAMETHASONE SODIUM PHOSPHATE 10 MG/ML IJ SOLN
INTRAMUSCULAR | Status: AC
  Filled 2020-05-15: qty 1

## 2020-05-15 MED ORDER — EPHEDRINE 5 MG/ML INJ
INTRAVENOUS | Status: AC
  Filled 2020-05-15: qty 10

## 2020-05-15 MED ORDER — ACETAMINOPHEN 500 MG PO TABS
ORAL_TABLET | ORAL | Status: AC
  Filled 2020-05-15: qty 2

## 2020-05-15 MED ORDER — ENSURE PRE-SURGERY PO LIQD: 296.0000 mL | Freq: Once | ORAL | Status: DC

## 2020-05-15 MED ORDER — FENTANYL CITRATE (PF) 100 MCG/2ML IJ SOLN
INTRAMUSCULAR | Status: DC | PRN
  Administered 2020-05-15: 50 ug via INTRAVENOUS
  Administered 2020-05-15 (×2): 25 ug via INTRAVENOUS
  Administered 2020-05-15: 50 ug via INTRAVENOUS

## 2020-05-15 MED ORDER — LIDOCAINE HCL (PF) 2 % IJ SOLN
INTRAMUSCULAR | Status: AC
  Filled 2020-05-15: qty 5

## 2020-05-15 MED ORDER — ROCURONIUM BROMIDE 10 MG/ML (PF) SYRINGE
PREFILLED_SYRINGE | INTRAVENOUS | Status: AC
  Filled 2020-05-15: qty 10

## 2020-05-15 MED ORDER — PROPOFOL 10 MG/ML IV BOLUS
INTRAVENOUS | Status: DC | PRN
  Administered 2020-05-15: 70 mg via INTRAVENOUS

## 2020-05-15 MED ORDER — SODIUM CHLORIDE 0.9 % IV SOLN: INTRAVENOUS | Status: DC

## 2020-05-15 MED ORDER — BUPIVACAINE LIPOSOME 1.3 % IJ SUSP
INTRAMUSCULAR | Status: DC | PRN
  Administered 2020-05-15: 20 mL

## 2020-05-15 MED ORDER — DEXAMETHASONE SODIUM PHOSPHATE 4 MG/ML IJ SOLN
INTRAMUSCULAR | Status: DC | PRN
  Administered 2020-05-15: 5 mg via INTRAVENOUS

## 2020-05-15 MED ORDER — BUPIVACAINE LIPOSOME 1.3 % IJ SUSP: 20.0000 mL | Freq: Once | INTRAMUSCULAR | Status: DC

## 2020-05-15 MED ORDER — ONDANSETRON HCL 4 MG/2ML IJ SOLN
INTRAMUSCULAR | Status: AC
  Filled 2020-05-15: qty 2

## 2020-05-15 MED ORDER — CEFAZOLIN SODIUM-DEXTROSE 2-4 GM/100ML-% IV SOLN
2.0000 g | INTRAVENOUS | Status: AC
  Administered 2020-05-15: 2 g via INTRAVENOUS

## 2020-05-15 MED ORDER — ONDANSETRON HCL 4 MG/2ML IJ SOLN: 4.0000 mg | Freq: Once | INTRAMUSCULAR | Status: DC | PRN

## 2020-05-15 MED ORDER — LIDOCAINE HCL (CARDIAC) PF 100 MG/5ML IV SOSY
PREFILLED_SYRINGE | INTRAVENOUS | Status: DC | PRN
  Administered 2020-05-15: 80 mg via INTRAVENOUS

## 2020-05-15 MED ORDER — EPHEDRINE SULFATE 50 MG/ML IJ SOLN
INTRAMUSCULAR | Status: DC | PRN
  Administered 2020-05-15 (×3): 10 mg via INTRAVENOUS

## 2020-05-15 MED ORDER — TRAMADOL HCL 50 MG PO TABS: 50.0000 mg | ORAL_TABLET | Freq: Four times a day (QID) | ORAL | 0 refills | Status: DC | PRN

## 2020-05-15 MED ORDER — FENTANYL CITRATE (PF) 100 MCG/2ML IJ SOLN: 25.0000 ug | INTRAMUSCULAR | Status: DC | PRN

## 2020-05-15 MED ORDER — FENTANYL CITRATE (PF) 100 MCG/2ML IJ SOLN
INTRAMUSCULAR | Status: AC
  Filled 2020-05-15: qty 2

## 2020-05-15 MED ORDER — GABAPENTIN 300 MG PO CAPS
300.0000 mg | ORAL_CAPSULE | ORAL | Status: AC
  Administered 2020-05-15: 300 mg via ORAL

## 2020-05-15 MED ORDER — BUPIVACAINE-EPINEPHRINE 0.25% -1:200000 IJ SOLN
INTRAMUSCULAR | Status: DC | PRN
  Administered 2020-05-15: 60 mL

## 2020-05-15 MED ORDER — ACETAMINOPHEN 500 MG PO TABS
1000.0000 mg | ORAL_TABLET | ORAL | Status: AC
  Administered 2020-05-15: 1000 mg via ORAL

## 2020-05-15 MED ORDER — GABAPENTIN 300 MG PO CAPS
ORAL_CAPSULE | ORAL | Status: AC
  Filled 2020-05-15: qty 1

## 2020-05-15 MED ORDER — ROCURONIUM BROMIDE 100 MG/10ML IV SOLN
INTRAVENOUS | Status: DC | PRN
  Administered 2020-05-15: 60 mg via INTRAVENOUS

## 2020-05-15 MED ORDER — CEFAZOLIN SODIUM-DEXTROSE 2-4 GM/100ML-% IV SOLN
INTRAVENOUS | Status: AC
  Filled 2020-05-15: qty 100

## 2020-05-15 MED ORDER — PROPOFOL 10 MG/ML IV BOLUS
INTRAVENOUS | Status: AC
  Filled 2020-05-15: qty 20

## 2020-05-15 MED ORDER — SUGAMMADEX SODIUM 200 MG/2ML IV SOLN
INTRAVENOUS | Status: DC | PRN
  Administered 2020-05-15: 200 mg via INTRAVENOUS

## 2020-05-15 MED ORDER — ONDANSETRON HCL 4 MG/2ML IJ SOLN
INTRAMUSCULAR | Status: DC | PRN
  Administered 2020-05-15: 4 mg via INTRAVENOUS

## 2020-05-15 MED ORDER — GLYCOPYRROLATE PF 0.2 MG/ML IJ SOSY
PREFILLED_SYRINGE | INTRAMUSCULAR | Status: AC
  Filled 2020-05-15: qty 3

## 2020-05-15 MED ORDER — LACTATED RINGERS IV SOLN: INTRAVENOUS | Status: DC | PRN

## 2020-05-15 SURGICAL SUPPLY — 41 items
APL PRP STRL LF DISP 70% ISPRP (MISCELLANEOUS) ×1
CABLE HIGH FREQUENCY MONO STRZ (ELECTRODE) ×3 IMPLANT
CANISTER SUCT 3000ML PPV (MISCELLANEOUS) IMPLANT
CHLORAPREP W/TINT 26 (MISCELLANEOUS) ×3 IMPLANT
COVER WAND RF STERILE (DRAPES) ×3 IMPLANT
DECANTER SPIKE VIAL GLASS SM (MISCELLANEOUS) ×3 IMPLANT
DEVICE SECURE STRAP 25 ABSORB (INSTRUMENTS) IMPLANT
DRAPE WARM FLUID 44X44 (DRAPES) ×3 IMPLANT
DRSG TEGADERM 2-3/8X2-3/4 SM (GAUZE/BANDAGES/DRESSINGS) ×4 IMPLANT
DRSG TEGADERM 4X4.75 (GAUZE/BANDAGES/DRESSINGS) ×3 IMPLANT
ELECT REM PT RETURN 9FT ADLT (ELECTROSURGICAL) ×3
ELECTRODE REM PT RTRN 9FT ADLT (ELECTROSURGICAL) ×1 IMPLANT
GLOVE ECLIPSE 8.0 STRL XLNG CF (GLOVE) ×3 IMPLANT
GLOVE INDICATOR 8.0 STRL GRN (GLOVE) ×3 IMPLANT
GOWN STRL REUS W/TWL XL LVL3 (GOWN DISPOSABLE) ×3 IMPLANT
HEMOSTAT SURGICEL 2X14 (HEMOSTASIS) ×2 IMPLANT
IRRIG SUCT STRYKERFLOW 2 WTIP (MISCELLANEOUS) ×3
IRRIGATION SUCT STRKRFLW 2 WTP (MISCELLANEOUS) IMPLANT
KIT TURNOVER CYSTO (KITS) ×3 IMPLANT
MANIFOLD NEPTUNE II (INSTRUMENTS) ×2 IMPLANT
MESH ULTRAPRO 6X6 15CM15CM (Mesh General) ×4 IMPLANT
NEEDLE HYPO 22GX1.5 SAFETY (NEEDLE) ×3 IMPLANT
NS IRRIG 500ML POUR BTL (IV SOLUTION) ×3 IMPLANT
PACK BASIN DAY SURGERY FS (CUSTOM PROCEDURE TRAY) ×3 IMPLANT
PAD POSITIONING PINK XL (MISCELLANEOUS) ×3 IMPLANT
SCISSORS LAP 5X35 DISP (ENDOMECHANICALS) ×3 IMPLANT
SET TUBE SMOKE EVAC HIGH FLOW (TUBING) ×3 IMPLANT
SLEEVE ADV FIXATION 5X100MM (TROCAR) ×3 IMPLANT
SPONGE GAUZE 2X2 8PLY STER LF (GAUZE/BANDAGES/DRESSINGS) ×1
SPONGE GAUZE 2X2 8PLY STRL LF (GAUZE/BANDAGES/DRESSINGS) ×4 IMPLANT
SUT MNCRL AB 4-0 PS2 18 (SUTURE) ×3 IMPLANT
SUT PDS AB 1 CT1 27 (SUTURE) ×2 IMPLANT
SUT VIC AB 2-0 SH 27 (SUTURE) ×3
SUT VIC AB 2-0 SH 27XBRD (SUTURE) IMPLANT
SUT VICRYL 0 UR6 27IN ABS (SUTURE) ×2 IMPLANT
TOWEL OR 17X26 10 PK STRL BLUE (TOWEL DISPOSABLE) ×3 IMPLANT
TRAY DSU PREP LF (CUSTOM PROCEDURE TRAY) ×3 IMPLANT
TRAY LAPAROSCOPIC (CUSTOM PROCEDURE TRAY) ×3 IMPLANT
TROCAR ADV FIXATION 5X100MM (TROCAR) ×3 IMPLANT
TROCAR XCEL BLUNT TIP 100MML (ENDOMECHANICALS) ×3 IMPLANT
WATER STERILE IRR 500ML POUR (IV SOLUTION) ×3 IMPLANT

## 2020-05-15 NOTE — Anesthesia Postprocedure Evaluation (Signed)
Anesthesia Post Note  Patient: [name] Juarez  Procedure(s) Performed: LAPAROSCOPIC LEFT  INGUINAL HERNIA REPAIR with mesh  (Left )     Patient location during evaluation: PACU Anesthesia Type: General Level of consciousness: awake and alert Pain management: pain level controlled Vital Signs Assessment: post-procedure vital signs reviewed and stable Respiratory status: spontaneous breathing, nonlabored ventilation, respiratory function stable and patient connected to nasal cannula oxygen Cardiovascular status: blood pressure returned to baseline and stable Postop Assessment: no apparent nausea or vomiting Anesthetic complications: no   No complications documented.  Last Vitals:  Vitals:   05/15/20 1415 05/15/20 1534  BP: (!) 108/57 118/63  Pulse: 65 (!) 54  Resp: 13 16  Temp: (!) 36.2 C (!) 36.4 C  SpO2: 91% 96%    Last Pain:  Vitals:   05/15/20 1534  TempSrc:   PainSc: 0-No pain                 Cecile Hearing

## 2020-05-15 NOTE — H&P (Signed)
[name] Juarez DOB: [birth year]  Patient Care Team: Johny Blamer, MD as PCP - General (Family Medicine) Pryor Ochoa, MD (Inactive) as Consulting Physician (Vascular Surgery) Karie Soda, MD as Consulting Physician (General Surgery) Romie Levee, MD as Consulting Physician (Colon and Rectal Surgery)  ` Patient sent for surgical consultation at the request of Dr Maisie Fus  Chief Complaint: Worsening left inguinal hernia. ` ` The patient is a pleasant active male originally from [place], [place]. He and his wife have been the [place] since the 1970s. He developed bowel changes pain and diarrhea and found to have a colovesical fistula. He underwent robotic surgery robotic low anterior resection and takedown 01/31/2020. Pathology consistent with diverticulitis. Inguinal hernia noted at the time of surgery. Did not seem particularly large. However on postop follow-up he noted he was getting larger and more uncomfortable. Referral offered to consider repair.  Patient notes he's having more sensitivity. He is wearing a truss and that helps a lot. Occasional over-the-counter medications. It done through rehabilitation and it got more sensitive than. He is walking about 10-15 minutes without difficulty. Getting stronger. No problems with urination. He is moving his bowels pretty regularly now. Risk of diarrhea much last. No fevers or chills. Incisions are healed well. Denies any history of infections or other problems. He had a right inguinal hernia repaired in an open fashion about 10 years ago. He believes its in town but I cannot find any records. Does not smoke. No diabetes. No blood thinners. No cardiac or pulmonary issues that he is aware of.  (Review of systems as stated in this history (HPI) or in the review of systems. Otherwise all other 12 point ROS are negative) ` ` ###########################################`  This patient encounter took 35 minutes today to  perform the following: obtain history, perform exam, review outside records, interpret tests & imaging, counsel the patient on their diagnosis; and, document this encounter, including findings & plan in the electronic health record (EHR).   Allergies Jaynee Eagles, CMA; 04/09/2020 9:32 AM) No Known Drug Allergies [12/03/2019]: Allergies Reconciled  Medication History Jaynee Eagles, CMA; 04/09/2020 9:33 AM) Joyce Copa (180MG  Tablet, Oral) Active. Aricept (10MG  Tablet, Oral) Active. Imodium (2MG  Capsule, Oral) Active. Probiotic (250MG  Capsule, Oral) Active. Tylenol (500MG  Capsule, 1 (one) Oral) Active. Medications Reconciled    Vitals CMA; 04/09/2020 9:33 AM) 04/09/2020 9:33 AM Weight: 153 lb Height: 68in Body Surface Area: 1.82 m Body Mass Index: 23.26 kg/m  Temp.: 97.81F  Pulse: 74 (Regular)  P.OX: 98% (Room air) BP: 138/78(Sitting, Left Arm, Standard)        Physical Exam MD; 04/09/2020 10:11 AM)  General Mental Status-Alert. General Appearance-Not in acute distress, Not Sickly. Orientation-Oriented X3. Hydration-Well hydrated. Voice-Normal. Note: Somewhat slow but steady gait. Transfers with minimal assist well.  Integumentary Global Assessment Upon inspection and palpation of skin surfaces of the - Axillae: non-tender, no inflammation or ulceration, no drainage. and Distribution of scalp and body hair is normal. General Characteristics Temperature - normal warmth is noted.  Head and Neck Head-normocephalic, atraumatic with no lesions or palpable masses. Face Global Assessment - atraumatic, no absence of expression. Neck Global Assessment - no abnormal movements, no bruit auscultated on the right, no bruit auscultated on the left, no decreased range of motion, non-tender. Trachea-midline. Thyroid Gland Characteristics - non-tender.  Eye Eyeball - Left-Extraocular movements  intact, No Nystagmus - Left. Eyeball - Right-Extraocular movements intact, No Nystagmus - Right. Cornea - Left-No Hazy - Left. Cornea - Right-No  Hazy - Right. Sclera/Conjunctiva - Left-No scleral icterus, No Discharge - Left. Sclera/Conjunctiva - Right-No scleral icterus, No Discharge - Right. Pupil - Left-Direct reaction to light normal. Pupil - Right-Direct reaction to light normal.  ENMT Ears Pinna - Left - no drainage observed, no generalized tenderness observed. Pinna - Right - no drainage observed, no generalized tenderness observed. Nose and Sinuses External Inspection of the Nose - no destructive lesion observed. Inspection of the nares - Left - quiet respiration. Inspection of the nares - Right - quiet respiration. Mouth and Throat Lips - Upper Lip - no fissures observed, no pallor noted. Lower Lip - no fissures observed, no pallor noted. Nasopharynx - no discharge present. Oral Cavity/Oropharynx - Tongue - no dryness observed. Oral Mucosa - no cyanosis observed. Hypopharynx - no evidence of airway distress observed.  Chest and Lung Exam Inspection Movements - Normal and Symmetrical. Accessory muscles - No use of accessory muscles in breathing. Palpation Palpation of the chest reveals - Non-tender. Auscultation Breath sounds - Normal and Clear.  Cardiovascular Auscultation Rhythm - Regular. Murmurs & Other Heart Sounds - Auscultation of the heart reveals - No Murmurs and No Systolic Clicks.  Abdomen Inspection Inspection of the abdomen reveals - No Visible peristalsis and No Abnormal pulsations. Umbilicus - No Bleeding, No Urine drainage. Palpation/Percussion Palpation and Percussion of the abdomen reveal - Soft, Non Tender, No Rebound tenderness, No Rigidity (guarding) and No Cutaneous hyperesthesia. Note: Abdomen soft. Thin-walled. Moderate suprapubic umbilical diastases recti but no hernia Nontender. Not distended. No umbilical or incisional  hernias. No guarding.  Male Genitourinary Sexual Maturity Tanner 5 - Adult hair pattern and Adult penile size and shape. Note: Patient wearing truss. Obvious left groin bulge consistent with sensitive but reducible left inguinal hernia.  Scarring and right groin but no evidence of any recurrent radial hernia. Otherwise uncircumcised male and normal external genitalia  Peripheral Vascular Upper Extremity Inspection - Left - No Cyanotic nailbeds - Left, Not Ischemic. Inspection - Right - No Cyanotic nailbeds - Right, Not Ischemic.  Neurologic Neurologic evaluation reveals -normal attention span and ability to concentrate, able to name objects and repeat phrases. Appropriate fund of knowledge , normal sensation and normal coordination. Mental Status Affect - not angry, not paranoid. Cranial Nerves-Normal Bilaterally. Gait-Normal.  Neuropsychiatric Mental status exam performed with findings of-able to articulate well with normal speech/language, rate, volume and coherence, thought content normal with ability to perform basic computations and apply abstract reasoning and no evidence of hallucinations, delusions, obsessions or homicidal/suicidal ideation.  Musculoskeletal Global Assessment Spine, Ribs and Pelvis - no instability, subluxation or laxity. Right Upper Extremity - no instability, subluxation or laxity.  Lymphatic Head & Neck  General Head & Neck Lymphatics: Bilateral - Description - No Localized lymphadenopathy. Axillary  General Axillary Region: Bilateral - Description - No Localized lymphadenopathy. Femoral & Inguinal  Generalized Femoral & Inguinal Lymphatics: Left - Description - No Localized lymphadenopathy. Right - Description - No Localized lymphadenopathy.    Assessment & Plan Ardeth Sportsman MD; 04/09/2020 10:11 AM)  LEFT INGUINAL HERNIA (K40.90) Impression: Obvious left inguinal hernia with sensitivity stabilized with  truss.  Continue focusing recovery with bowel regimen. Tylenol and heat and truss as tolerated to help with sensitivity.  At some point he would benefit from hernia surgery. Because of his surgery for colovaginal fistula only 2 months ago, would like to wait a little bit longer. Would like to wait 3 months from his colorectal surgery to allow tissue and inflammation to  calm down. Try preperitoneal laparoscopic approach with low threshold to do more open approach. Allow diagnostic laparoscopy make sure there is no evidence of any recurrent hernia on the right or new hernias on that side. He is wife are interested in proceeding. Should able to do outpatient setting  He has good performance status and did not have any issues with his colorectal surgery so I do not think further cardiac clearance is needed.  The anatomy & physiology of the abdominal wall and pelvic floor was discussed. The pathophysiology of hernias in the inguinal and pelvic region was discussed. Natural history risks such as progressive enlargement, pain, incarceration, and strangulation was discussed. Contributors to complications such as smoking, obesity, diabetes, prior surgery, etc were discussed.  I feel the risks of no intervention will lead to serious problems that outweigh the operative risks; therefore, I recommended surgery to reduce and repair the hernia. I explained laparoscopic techniques with possible need for an open approach. I noted usual use of mesh to patch and/or buttress hernia repair  Risks such as bleeding, infection, abscess, need for further treatment, heart attack, death, and other risks were discussed. I noted a good likelihood this will help address the problem. Goals of post-operative recovery were discussed as well. Possibility that this will not correct all symptoms was explained. I stressed the importance of low-impact activity, aggressive pain control, avoiding constipation, & not pushing  through pain to minimize risk of post-operative chronic pain or injury. Possibility of reherniation was discussed. We will work to minimize complications.  An educational handout further explaining the pathology & treatment options was given as well. Questions were answered. The patient expresses understanding & wishes to proceed with surgery.  Pt Education - [facility] Hernia Post-Op HCI (Bairon Klemann): discussed with patient and provided information. Pt Education - [facility] Mesh education: discussed with patient and provided information.   Ardeth Sportsman, MD, FACS, MASCRS Gastrointestinal and Minimally Invasive Surgery  [facility] [address] [place], [place] [address] [phone] Fax [phone] Main/Paging  CONTACT INFORMATION: Weekday (9AM-5PM) concerns: Call [facility] main office at [phone] Weeknight (5PM-9AM) or Weekend/Holiday concerns: Check [email or web address] for General Surgery [facility] coverage (Please, do not use SecureChat as it is not reliable communication to operating surgeons for immediate patient care)

## 2020-05-15 NOTE — Op Note (Signed)
05/15/2020  12:43 PM  PATIENT:  [name] Juarez  75 y.o. male  Patient Care Team: Johny Blamer, MD as PCP - General (Family Medicine) Pryor Ochoa, MD (Inactive) as Consulting Physician (Vascular Surgery) Karie Soda, MD as Consulting Physician (General Surgery) Romie Levee, MD as Consulting Physician (Colon and Rectal Surgery)  PRE-OPERATIVE DIAGNOSIS:  LEFT AND POSSIBLE RIGHT INGUINAL HERNIA REPAIR  POST-OPERATIVE DIAGNOSIS:   LAPAROSCOPIC LEFT INGUINAL HERNIA REPAIR DIAGNOSTIC LAPAROSCOPY  PROCEDURE: LAPAROSCOPIC LEFT  INGUINAL HERNIA REPAIR WITH MESH  SURGEON:  Ardeth Sportsman, MD  ASSISTANT: None  ANESTHESIA:     Regional ilioinguinal and genitofemoral and spermatic cord nerve blocks  General  Nerve block provided with liposomal bupivacaine (Experel) mixed with 0.25% bupivacaine as a Bilateral TAP block x 46mL each side at the level of the transverse abdominis & preperitoneal spaces along the flank at the anterior axillary line, from subcostal ridge to iliac crest under laparoscopic guidance    EBL:  Total I/O In: 1000 [I.V.:1000] Out: 30 [Blood:30].  See anesthesia record  Delay start of Pharmacological VTE agent (>24hrs) due to surgical blood loss or risk of bleeding:  no  DRAINS: NONE  SPECIMEN:  NONE  DISPOSITION OF SPECIMEN:  N/A  COUNTS:  YES  PLAN OF CARE: Discharge to home after PACU  PATIENT DISPOSITION:  PACU - hemodynamically stable.  INDICATION: Pleasant gentleman with history of colovesical fistula repair and worsening left inguinal hernia.  Distant history of right inguinal hernia with some questionable discomfort there.  Recommended laparoscopic exploration and repair of hernias found  The anatomy & physiology of the abdominal wall and pelvic floor was discussed.  The pathophysiology of hernias in the inguinal and pelvic region was discussed.  Natural history risks such as progressive enlargement, pain, incarceration & strangulation was  discussed.   Contributors to complications such as smoking, obesity, diabetes, prior surgery, etc were discussed.    I feel the risks of no intervention will lead to serious problems that outweigh the operative risks; therefore, I recommended surgery to reduce and repair the hernia.  I explained laparoscopic techniques with possible need for an open approach.  I noted usual use of mesh to patch and/or buttress hernia repair  Risks such as bleeding, infection, abscess, need for further treatment, heart attack, death, and other risks were discussed.  I noted a good likelihood this will help address the problem.   Goals of post-operative recovery were discussed as well.  Possibility that this will not correct all symptoms was explained.  I stressed the importance of low-impact activity, aggressive pain control, avoiding constipation, & not pushing through pain to minimize risk of post-operative chronic pain or injury. Possibility of reherniation was discussed.  We will work to minimize complications.     An educational handout further explaining the pathology & treatment options was given as well.  Questions were answered.  The patient expresses understanding & wishes to proceed with surgery.  OR FINDINGS: Moderate size indirect inguinal hernia on the left.  No direct space femoral obturator hernia.    On the right side with evidence of prior open inguinal hernia repair preperitoneum all patch of mesh.  No recurrent hernia.  No femoral or obturator hernia.  DESCRIPTION:  The patient was identified & brought into the operating room. The patient was positioned supine with arms tucked. SCDs were active during the entire case. The patient underwent general anesthesia without any difficulty.  The abdomen was prepped and draped in a sterile fashion. The  patient's bladder was emptied.  A Surgical Timeout confirmed our plan.  I made a transverse incision through the inferior umbilical fold.  I made a small  transverse nick through the anterior rectus fascia contralateral to the inguinal hernia side and placed a 0-vicryl stitch through the fascia.  I placed a Hasson trocar into the preperitoneal plane.  Entry was clean.  We induced carbon dioxide insufflation. Camera inspection revealed no injury.  I used a 29mm angled scope to bluntly free the peritoneum off the infraumbilical anterior abdominal wall.  I created enough of a preperitoneal pocket to place 28mm ports into the right & left mid-abdomen into this preperitoneal cavity.  I focused attention on the LEFT pelvis since that was the dominant hernia side.   I used blunt & focused sharp dissection to free the peritoneum off the flank and down to the pubic rim.  I freed the anteriolateral bladder wall off the anteriolateral pelvic wall, sparing midline attachments.   I located a swath of peritoneum going into a hernia fascial defect at the  internal ring consistent with  an indirect inguinal hernia..  I gradually freed the peritoneal hernia sac off safely and reduced it into the preperitoneal space.  I freed the peritoneum off the spermatic vessels & vas deferens.  I freed peritoneum off the retroperitoneum along the psoas muscle.  I did peritoneal repair with high ligation using 2-0 Vicryl using laparoscopic suturing.  Spermatic cord lipoma was dissected away & removed.  Patient did have some oozing of the left lateral pelvis near the obturator region.  I checked & assured hemostasis.     I turned attention on the opposite  RIGHT pelvis.  I did dissection in a similar, mirror-image fashion.  He has been peritoneal adhesions with the shrunken disc of mesh in the preperitoneal space.  I opened up the peritoneum to confirm that there was no recurrent hernia as a direct space or internal ring.  I mobilized to confirm no femoral nor obturator hernia.  I closed.  Defect using 2-0 Vicryl using laparoscopic intracorporeal suturing.  The patient had no hernia.  Spermatic  cord lipoma was dissected away & removed.    I checked & assured hemostasis.   Packed it off with some  Removed the packing gauze.  I removed the Ray-Tec gauze packing.  Hemostasis was good in the left pelvis.  Placed some Surgicel in that region as well.  All packing had been removed.  Lap and needle counts were correct  I chose 15x15 cm sheets of ultra-lightweight polypropylene mesh (Ultrapro), one for left side.  I cut a single sigmoid-shaped slit ~6cm from a corner of each mesh.  I placed the meshes into the preperitoneal space & laid them as overlapping diamonds such that at the inferior points, a 6x6 cm corner flap rested in the true anterolateral pelvis, covering the obturator & femoral foramina.   I allowed the bladder to return to the pubis, this helping tuck the corners of the mesh in the anteriolateral pelvis.  Given the numerous hernia of moderate size, I placed a third 15x15cm mesh in the center as a vertical diamond.  The lateral wings of the mesh overlap across the direct spaces and internal rings where the dominant hernias were.  This provided good coverage and reinforcement of the hernia repairs.  Because of the central mesh placement with good overlap, I did not place any tacks.   I held the hernia sacs cephalad & evacuated carbon dioxide.  I closed the fascia with absorbable suture.  I closed the skin using 4-0 monocryl stitch.  Sterile dressings were applied.   The patient was extubated & arrived in the PACU in stable condition..  I had discussed postoperative care with the patient in the holding area.  Instructions are written in the chart.  I made an attempt to locate family to discuss patient's status and recommendations.  No one is available at this time.  I will try again later    Ardeth Sportsman, M.D., F.A.C.S. Gastrointestinal and Minimally Invasive Surgery [facility] [address], [address] [place], [place] [address] [phone] Main /  Paging  05/15/2020 12:43 PM

## 2020-05-15 NOTE — Transfer of Care (Signed)
Immediate Anesthesia Transfer of Care Note  Patient: [name] Juarez  Procedure(s) Performed: LAPAROSCOPIC LEFT  INGUINAL HERNIA REPAIR with mesh  (Left )  Patient Location: PACU  Anesthesia Type:General  Level of Consciousness: awake, alert , oriented and patient cooperative  Airway & Oxygen Therapy: Patient Spontanous Breathing and Patient connected to nasal cannula oxygen  Post-op Assessment: Report given to RN and Post -op Vital signs reviewed and stable  Post vital signs: Reviewed and stable  Last Vitals:  Vitals Value Taken Time  BP 147/63 05/15/20 1255  Temp    Pulse 70 05/15/20 1256  Resp 13 05/15/20 1256  SpO2 98 % 05/15/20 1256  Vitals shown include unvalidated device data.  Last Pain:  Vitals:   05/15/20 0804  TempSrc: Oral  PainSc: 0-No pain      Patients Stated Pain Goal: 5 (05/15/20 0804)  Complications: No complications documented.

## 2020-05-15 NOTE — Discharge Instructions (Signed)
HERNIA REPAIR: POST OP INSTRUCTIONS ° °###################################################################### ° °EAT °Gradually transition to a high fiber diet with a fiber supplement over the next few weeks after discharge.  Start with a pureed / full liquid diet (see below) ° °WALK °Walk an hour a day.  Control your pain to do that.   ° °CONTROL PAIN °Control pain so that you can walk, sleep, tolerate sneezing/coughing, and go up/down stairs. ° °HAVE A BOWEL MOVEMENT DAILY °Keep your bowels regular to avoid problems.  OK to try a laxative to override constipation.  OK to use an antidairrheal to slow down diarrhea.  Call if not better after 2 tries ° °CALL IF YOU HAVE PROBLEMS/CONCERNS °Call if you are still struggling despite following these instructions. °Call if you have concerns not answered by these instructions ° °###################################################################### ° ° ° °1. DIET: Follow a light bland diet & liquids the first 24 hours after arrival home, such as soup, liquids, starches, etc.  Be sure to drink plenty of fluids.  Quickly advance to a usual solid diet within a few days.  Avoid fast food or heavy meals as your are more likely to get nauseated or have irregular bowels.  A low-fat, high-fiber diet for the rest of your life is ideal.  ° °2. Take your usually prescribed home medications unless otherwise directed. ° °3. PAIN CONTROL: °a. Pain is best controlled by a usual combination of three different methods TOGETHER: °i. Ice/Heat °ii. Over the counter pain medication °iii. Prescription pain medication °b. Most patients will experience some swelling and bruising around the hernia(s) such as the bellybutton, groins, or old incisions.  Ice packs or heating pads (30-60 minutes up to 6 times a day) will help. Use ice for the first few days to help decrease swelling and bruising, then switch to heat to help relax tight/sore spots and speed recovery.  Some people prefer to use ice  alone, heat alone, alternating between ice & heat.  Experiment to what works for you.  Swelling and bruising can take several weeks to resolve.   °c. It is helpful to take an over-the-counter pain medication regularly for the first few weeks.  Choose one of the following that works best for you: °i. Naproxen (Aleve, etc)  Two 220mg tabs twice a day °ii. Ibuprofen (Advil, etc) Three 200mg tabs four times a day (every meal & bedtime) °iii. Acetaminophen (Tylenol, etc) 325-650mg four times a day (every meal & bedtime) °d. A  prescription for pain medication should be given to you upon discharge.  Take your pain medication as prescribed.  °i. If you are having problems/concerns with the prescription medicine (does not control pain, nausea, vomiting, rash, itching, etc), please call us [phone] to see if we need to switch you to a different pain medicine that will work better for you and/or control your side effect better. °ii. If you need a refill on your pain medication, please contact your pharmacy.  They will contact our office to request authorization. Prescriptions will not be filled after 5 pm or on week-ends. ° °4. Avoid getting constipated.  Between the surgery and the pain medications, it is common to experience some constipation.  Increasing fluid intake and taking a fiber supplement (such as Metamucil, Citrucel, FiberCon, MiraLax, etc) 1-2 times a day regularly will usually help prevent this problem from occurring.  A mild laxative (prune juice, Milk of Magnesia, MiraLax, etc) should be taken according to package directions if there are no bowel movements after 48   hours.    5. Wash / shower every day.  You may shower over the dressings as they are waterproof.    6. Remove your waterproof bandages, skin tapes, and other bandages 3 days after surgery. You may replace a dressing/Band-Aid to cover the incision for comfort if you wish. You may leave the incisions open to air.  You may replace a  dressing/Band-Aid to cover an incision for comfort if you wish.  Continue to shower over incision(s) after the dressing is off.  7. ACTIVITIES as tolerated:   a. You may resume regular (light) daily activities beginning the next day--such as daily self-care, walking, climbing stairs--gradually increasing activities as tolerated.  Control your pain so that you can walk an hour a day.  If you can walk 30 minutes without difficulty, it is safe to try more intense activity such as jogging, treadmill, bicycling, low-impact aerobics, swimming, etc. b. Save the most intensive and strenuous activity for last such as sit-ups, heavy lifting, contact sports, etc  Refrain from any heavy lifting or straining until you are off narcotics for pain control.   c. DO NOT PUSH THROUGH PAIN.  Let pain be your guide: If it hurts to do something, don't do it.  Pain is your body warning you to avoid that activity for another week until the pain goes down. d. You may drive when you are no longer taking prescription pain medication, you can comfortably wear a seatbelt, and you can safely maneuver your car and apply brakes. e. Bonita Quin may have sexual intercourse when it is comfortable.   8. FOLLOW UP in our office a. Please call [facility] at [phone] to set up an appointment to see your surgeon in the office for a follow-up appointment approximately 2-3 weeks after your surgery. b. Make sure that you call for this appointment the day you arrive home to insure a convenient appointment time.  9.  If you have disability of FMLA / Family leave forms, please bring the forms to the office for processing.  (do not give to your surgeon).  WHEN TO CALL us [phone]: 1. Poor pain control 2. Reactions / problems with new medications (rash/itching, nausea, etc)  3. Fever over 101.5 F (38.5 C) 4. Inability to urinate 5. Nausea and/or vomiting 6. Worsening swelling or bruising 7. Continued bleeding from incision. 8. Increased pain,  redness, or drainage from the incision   The clinic staff is available to answer your questions during regular business hours (8:30am-5pm).  Please dont hesitate to call and ask to speak to one of our nurses for clinical concerns.   If you have a medical emergency, go to the nearest emergency room or call 911.  A surgeon from [facility] is always on call at the hospitals in [facility], [place] [address], [place], [place]  [address] ?  [address] [address], [place], [place]   [address] MAIN: [phone] ? TOLL FREE: [phone] ? FAX: [phone] [email or web address]    Post Anesthesia Home Care Instructions  Activity: Get plenty of rest for the remainder of the day. A responsible individual must stay with you for 24 hours following the procedure.  For the next 24 hours, DO NOT: -Drive a car -Advertising copywriter -Drink alcoholic beverages -Take any medication unless instructed by your physician -Make any legal decisions or sign important papers.  Meals: Start with liquid foods such as gelatin or soup. Progress to regular foods as tolerated. Avoid  greasy, spicy, heavy foods. If nausea and/or vomiting occur, drink only clear liquids until the nausea and/or vomiting subsides. Call your physician if vomiting continues.  Special Instructions/Symptoms: Your throat may feel dry or sore from the anesthesia or the breathing tube placed in your throat during surgery. If this causes discomfort, gargle with warm salt water. The discomfort should disappear within 24 hours.  Information for Discharge Teaching: EXPAREL (bupivacaine liposome injectable suspension)   Your surgeon or anesthesiologist gave you EXPAREL(bupivacaine) to help control your pain after surgery.   EXPAREL is a local anesthetic that provides pain relief by numbing the tissue around the surgical site.  EXPAREL is designed to release pain medication over time and  can control pain for up to 72 hours.  Depending on how you respond to EXPAREL, you may require less pain medication during your recovery.  Possible side effects:  Temporary loss of sensation or ability to move in the area where bupivacaine was injected.  Nausea, vomiting, constipation  Rarely, numbness and tingling in your mouth or lips, lightheadedness, or anxiety may occur.  Call your doctor right away if you think you may be experiencing any of these sensations, or if you have other questions regarding possible side effects.  Follow all other discharge instructions given to you by your surgeon or nurse. Eat a healthy diet and drink plenty of water or other fluids.  If you return to the hospital for any reason within 96 hours following the administration of EXPAREL, it is important for health care providers to know that you have received this anesthetic. A teal colored band has been placed on your arm with the date, time and amount of EXPAREL you have received in order to alert and inform your health care providers. Please leave this armband in place for the full 96 hours following administration, and then you may remove the band.  Do not remove green armband before Monday, Feb. 14, 2022.

## 2020-05-15 NOTE — Anesthesia Preprocedure Evaluation (Addendum)
Anesthesia Evaluation  Patient identified by MRN, date of birth, ID band Patient awake    Reviewed: Allergy & Precautions, NPO status , Patient's Chart, lab work & pertinent test results  Airway Mallampati: II  TM Distance: >3 FB Neck ROM: Full    Dental  (+) Dental Advisory Given, Partial Lower, Partial Upper   Pulmonary former smoker,    Pulmonary exam normal breath sounds clear to auscultation       Cardiovascular hypertension, + Peripheral Vascular Disease  Normal cardiovascular exam Rhythm:Regular Rate:Normal     Neuro/Psych PSYCHIATRIC DISORDERS Dementia negative neurological ROS     GI/Hepatic Neg liver ROS, LEFT AND POSSIBLE RIGHT INGUINAL HERNIA REPAIR   Endo/Other  negative endocrine ROS  Renal/GU Renal InsufficiencyRenal disease     Musculoskeletal negative musculoskeletal ROS (+)   Abdominal   Peds  Hematology negative hematology ROS (+)   Anesthesia Other Findings Day of surgery medications reviewed with the patient.  Reproductive/Obstetrics                             Anesthesia Physical Anesthesia Plan  ASA: II  Anesthesia Plan: General   Post-op Pain Management:    Induction: Intravenous  PONV Risk Score and Plan: 3 and Dexamethasone, Ondansetron and Treatment may vary due to age or medical condition  Airway Management Planned: Oral ETT  Additional Equipment:   Intra-op Plan:   Post-operative Plan: Extubation in OR  Informed Consent: I have reviewed the patients History and Physical, chart, labs and discussed the procedure including the risks, benefits and alternatives for the proposed anesthesia with the patient or authorized representative who has indicated his/her understanding and acceptance.     Dental advisory given  Plan Discussed with: CRNA  Anesthesia Plan Comments:         Anesthesia Quick Evaluation

## 2020-05-15 NOTE — Anesthesia Procedure Notes (Signed)
Procedure Name: Intubation Date/Time: 05/15/2020 10:50 AM Performed by: Justice Rocher, CRNA Pre-anesthesia Checklist: Patient identified, Emergency Drugs available, Suction available, Patient being monitored and Timeout performed Patient Re-evaluated:Patient Re-evaluated prior to induction Oxygen Delivery Method: Circle system utilized Preoxygenation: Pre-oxygenation with 100% oxygen Induction Type: IV induction Ventilation: Mask ventilation without difficulty Laryngoscope Size: Mac and 4 Grade View: Grade II Tube type: Oral Tube size: 7.5 mm Number of attempts: 1 Airway Equipment and Method: Stylet and Oral airway Placement Confirmation: ETT inserted through vocal cords under direct vision,  positive ETCO2,  breath sounds checked- equal and bilateral and CO2 detector Secured at: 23 cm Tube secured with: Tape Dental Injury: Teeth and Oropharynx as per pre-operative assessment

## 2020-05-15 NOTE — Interval H&P Note (Signed)
History and Physical Interval Note:  05/15/2020 10:38 AM  [name] Juarez  has presented today for surgery, with the diagnosis of LEFT AND POSSIBLE RIGHT INGUINAL HERNIA REPAIR.  The various methods of treatment have been discussed with the patient and family. After consideration of risks, benefits and other options for treatment, the patient has consented to  Procedure(s): LAPAROSCOPIC LEFT AND POSSIBLE RIGHT INGUINAL HERNIA REPAIR (Left) as a surgical intervention.  The patient's history has been reviewed, patient examined, no change in status, stable for surgery.  I have reviewed the patient's chart and labs.  Questions were answered to the patient's satisfaction.    I have re-reviewed the the patient's records, history, medications, and allergies.  I have re-examined the patient.  I again discussed intraoperative plans and goals of post-operative recovery.  The patient agrees to proceed.  Major Santerre  1945/12/02 [id number]  Patient Care Team: Johny Blamer, MD as PCP - General (Family Medicine) Pryor Ochoa, MD (Inactive) as Consulting Physician (Vascular Surgery) Karie Soda, MD as Consulting Physician (General Surgery) Romie Levee, MD as Consulting Physician (Colon and Rectal Surgery)  Patient Active Problem List   Diagnosis Date Noted   Left inguinal hernia 04/09/2020   Colovesical fistula 11/14/2019   Acute renal failure - resolving 08/08/2013   Hypertension    Acute appendicitis with peritonitis, perforation s/p lap appy 08/03/2013 08/03/2013   Abdominal aneurysm without mention of rupture 09/28/2011   Varicose veins of lower extremities with other complications 04/27/2011    Past Medical History:  Diagnosis Date   Chronic kidney disease (CKD) stage G3a/A1, moderately decreased glomerular filtration rate (GFR) between 45-59 mL/min/1.73 square meter and albuminuria creatinine ratio less than 30 mg/g (HCC)    ckd stage 3 b   Colon polyp 10/2006   Dementia (HCC)    early  stage   Erectile dysfunction    History of hypertension    off all meds x 2 years as of 05-13-2020   Hyperlipidemia    Inguinal hernia    Left   Peripheral vascular disease (HCC)    Pre-diabetes    Tuberculosis    age 78 tx done   Varicose veins    both legs   Wears partial dentures upper and lower    Past Surgical History:  Procedure Laterality Date   ABDOMINAL AORTIC ENDOVASCULAR STENT GRAFT  11/03/2011   right leg   COLONOSCOPY  2021   CYSTOSCOPY W/ URETERAL STENT PLACEMENT N/A 01/31/2020   Procedure: FIREFLY INJECTION;  Surgeon: Jannifer Hick, MD;  Location: [facility];  Service: Urology;  Laterality: N/A;   evar  2013   INGUINAL HERNIA REPAIR Right 2000   open RIH repair   KNEE SURGERY Right    LAPAROSCOPIC APPENDECTOMY N/A 08/03/2013   Procedure: APPENDECTOMY LAPAROSCOPIC;  Surgeon: Velora Heckler, MD;  Location: [facility];  Service: General;  Laterality: N/A;   LAPAROSCOPIC LOW ANTERIOR RESECTION  10/ 28/2021    Social History   Socioeconomic History   Marital status: Married    Spouse name: Not on file   Number of children: Not on file   Years of education: Not on file   Highest education level: Not on file  Occupational History   Not on file  Tobacco Use   Smoking status: Former Smoker    Packs/day: 1.00    Years: 45.00    Pack years: 45.00    Types: Cigarettes    Quit date: 11/05/2011    Years since quitting: 8.5  Smokeless tobacco: Never Used  Vaping Use   Vaping Use: Never used  Substance and Sexual Activity   Alcohol use: Yes    Comment: 1-2 per day   Drug use: No   Sexual activity: Not on file  Other Topics Concern   Not on file  Social History Narrative   Originally from [place].  [place], [place]    In [place] since 1970s   Social Determinants of Health   Financial Resource Strain: Not on [employer] Insecurity: Not on file  Transportation Needs: Not on file  Physical Activity: Not on file  Stress: Not on file  Social Connections: Not on  file  Intimate Partner Violence: Not on file    Family History  Problem Relation Age of Onset   Tuberculosis Father    Tuberculosis Sister     Medications Prior to Admission  Medication Sig Dispense Refill Last Dose   acetaminophen (TYLENOL) 500 MG tablet Take 2 tablets (1,000 mg total) by mouth every 6 (six) hours as needed for mild pain. 30 tablet 0 05/14/2020 at Unknown time   donepezil (ARICEPT) 10 MG tablet Take 10 mg by mouth daily.   05/15/2020 at 0600   fexofenadine (ALLEGRA) 180 MG tablet Take 180 mg by mouth daily.   05/15/2020 at 0600    Current Facility-Administered Medications  Medication Dose Route Frequency Provider Last Rate Last Admin   0.9 %  sodium chloride infusion   Intravenous Continuous Bethena Midget, MD 50 mL/hr at 05/15/20 0814 New Bag at 05/15/20 0814   bupivacaine liposome (EXPAREL) 1.3 % injection 266 mg  20 mL Infiltration Once Karie Soda, MD       ceFAZolin (ANCEF) IVPB 2g/100 mL premix  2 g Intravenous On Call to OR Karie Soda, MD       Chlorhexidine Gluconate Cloth 2 % PADS 6 each  6 each Topical Once Karie Soda, MD       And   Chlorhexidine Gluconate Cloth 2 % PADS 6 each  6 each Topical Once Karie Soda, MD       Melene Muller ON 05/16/2020] feeding supplement (ENSURE PRE-SURGERY) liquid 296 mL  296 mL Oral Once Karie Soda, MD         No Known Allergies  BP (!) 146/66   Pulse 63   Temp 97.7 F (36.5 C) (Oral)   Resp 12   Ht 5\' 7"  (1.702 m)   Wt 69 kg   SpO2 100%   BMI 23.82 kg/m   Labs: No results found for this or any previous visit (from the past 48 hour(s)).  Imaging / Studies: No results found.   , M.D., F.A.C.S. Gastrointestinal and Minimally Invasive Surgery [facility] [address] [place], [place] [phone] Main / Paging  05/15/2020 10:38 AM    07/13/2020

## 2020-05-16 ENCOUNTER — Encounter (HOSPITAL_BASED_OUTPATIENT_CLINIC_OR_DEPARTMENT_OTHER): Payer: Self-pay | Admitting: Surgery

## 2020-06-04 DIAGNOSIS — R413 Other amnesia: Secondary | ICD-10-CM | POA: Diagnosis not present

## 2020-06-04 DIAGNOSIS — J300 Vasomotor rhinitis: Secondary | ICD-10-CM | POA: Diagnosis not present

## 2020-06-04 DIAGNOSIS — N183 Chronic kidney disease, stage 3 unspecified: Secondary | ICD-10-CM | POA: Diagnosis not present

## 2020-06-04 DIAGNOSIS — E7800 Pure hypercholesterolemia, unspecified: Secondary | ICD-10-CM | POA: Diagnosis not present

## 2020-06-04 DIAGNOSIS — R7303 Prediabetes: Secondary | ICD-10-CM | POA: Diagnosis not present

## 2020-10-27 DIAGNOSIS — N189 Chronic kidney disease, unspecified: Secondary | ICD-10-CM | POA: Diagnosis not present

## 2020-10-27 DIAGNOSIS — I129 Hypertensive chronic kidney disease with stage 1 through stage 4 chronic kidney disease, or unspecified chronic kidney disease: Secondary | ICD-10-CM | POA: Diagnosis not present

## 2020-10-27 DIAGNOSIS — N1832 Chronic kidney disease, stage 3b: Secondary | ICD-10-CM | POA: Diagnosis not present

## 2020-10-27 DIAGNOSIS — N2581 Secondary hyperparathyroidism of renal origin: Secondary | ICD-10-CM | POA: Diagnosis not present

## 2020-10-27 DIAGNOSIS — N321 Vesicointestinal fistula: Secondary | ICD-10-CM | POA: Diagnosis not present

## 2020-10-27 DIAGNOSIS — D631 Anemia in chronic kidney disease: Secondary | ICD-10-CM | POA: Diagnosis not present

## 2020-12-05 DIAGNOSIS — E7800 Pure hypercholesterolemia, unspecified: Secondary | ICD-10-CM | POA: Diagnosis not present

## 2020-12-05 DIAGNOSIS — Z9889 Other specified postprocedural states: Secondary | ICD-10-CM | POA: Diagnosis not present

## 2020-12-05 DIAGNOSIS — I10 Essential (primary) hypertension: Secondary | ICD-10-CM | POA: Diagnosis not present

## 2020-12-05 DIAGNOSIS — R7309 Other abnormal glucose: Secondary | ICD-10-CM | POA: Diagnosis not present

## 2020-12-05 DIAGNOSIS — Z0000 Encounter for general adult medical examination without abnormal findings: Secondary | ICD-10-CM | POA: Diagnosis not present

## 2020-12-05 DIAGNOSIS — I679 Cerebrovascular disease, unspecified: Secondary | ICD-10-CM | POA: Diagnosis not present

## 2020-12-05 DIAGNOSIS — I739 Peripheral vascular disease, unspecified: Secondary | ICD-10-CM | POA: Diagnosis not present

## 2020-12-05 DIAGNOSIS — R8281 Pyuria: Secondary | ICD-10-CM | POA: Diagnosis not present

## 2020-12-05 DIAGNOSIS — Z23 Encounter for immunization: Secondary | ICD-10-CM | POA: Diagnosis not present

## 2020-12-05 DIAGNOSIS — N183 Chronic kidney disease, stage 3 unspecified: Secondary | ICD-10-CM | POA: Diagnosis not present

## 2020-12-05 DIAGNOSIS — M1711 Unilateral primary osteoarthritis, right knee: Secondary | ICD-10-CM | POA: Diagnosis not present

## 2020-12-05 DIAGNOSIS — R413 Other amnesia: Secondary | ICD-10-CM | POA: Diagnosis not present

## 2020-12-24 DIAGNOSIS — R899 Unspecified abnormal finding in specimens from other organs, systems and tissues: Secondary | ICD-10-CM | POA: Diagnosis not present

## 2020-12-24 DIAGNOSIS — R8281 Pyuria: Secondary | ICD-10-CM | POA: Diagnosis not present

## 2021-06-29 DIAGNOSIS — N183 Chronic kidney disease, stage 3 unspecified: Secondary | ICD-10-CM | POA: Diagnosis not present

## 2021-06-29 DIAGNOSIS — J300 Vasomotor rhinitis: Secondary | ICD-10-CM | POA: Diagnosis not present

## 2021-06-29 DIAGNOSIS — E7800 Pure hypercholesterolemia, unspecified: Secondary | ICD-10-CM | POA: Diagnosis not present

## 2021-06-29 DIAGNOSIS — I739 Peripheral vascular disease, unspecified: Secondary | ICD-10-CM | POA: Diagnosis not present

## 2021-06-29 DIAGNOSIS — R7303 Prediabetes: Secondary | ICD-10-CM | POA: Diagnosis not present

## 2021-06-29 DIAGNOSIS — R413 Other amnesia: Secondary | ICD-10-CM | POA: Diagnosis not present

## 2021-06-29 DIAGNOSIS — I679 Cerebrovascular disease, unspecified: Secondary | ICD-10-CM | POA: Diagnosis not present

## 2021-07-01 DIAGNOSIS — I129 Hypertensive chronic kidney disease with stage 1 through stage 4 chronic kidney disease, or unspecified chronic kidney disease: Secondary | ICD-10-CM | POA: Diagnosis not present

## 2021-07-01 DIAGNOSIS — R809 Proteinuria, unspecified: Secondary | ICD-10-CM | POA: Diagnosis not present

## 2021-07-01 DIAGNOSIS — N1832 Chronic kidney disease, stage 3b: Secondary | ICD-10-CM | POA: Diagnosis not present

## 2021-07-01 DIAGNOSIS — D631 Anemia in chronic kidney disease: Secondary | ICD-10-CM | POA: Diagnosis not present

## 2021-07-01 DIAGNOSIS — N2581 Secondary hyperparathyroidism of renal origin: Secondary | ICD-10-CM | POA: Diagnosis not present

## 2021-07-28 ENCOUNTER — Ambulatory Visit: Payer: Self-pay | Admitting: Psychiatry

## 2021-08-12 ENCOUNTER — Encounter: Payer: Self-pay | Admitting: *Deleted

## 2021-08-17 ENCOUNTER — Ambulatory Visit: Payer: Medicare Other | Admitting: Psychiatry

## 2021-08-17 ENCOUNTER — Encounter: Payer: Self-pay | Admitting: Psychiatry

## 2021-08-17 VITALS — BP 128/76 | HR 59 | Ht 67.0 in | Wt 161.0 lb

## 2021-08-17 DIAGNOSIS — R413 Other amnesia: Secondary | ICD-10-CM | POA: Diagnosis not present

## 2021-08-17 DIAGNOSIS — F03B0 Unspecified dementia, moderate, without behavioral disturbance, psychotic disturbance, mood disturbance, and anxiety: Secondary | ICD-10-CM | POA: Diagnosis not present

## 2021-08-17 DIAGNOSIS — G20 Parkinson's disease: Secondary | ICD-10-CM

## 2021-08-17 MED ORDER — CARBIDOPA-LEVODOPA 25-100 MG PO TABS: ORAL_TABLET | ORAL | 2 refills | Status: DC

## 2021-08-17 NOTE — Patient Instructions (Addendum)
Plan: ? ?Blood work: Vitamin B12 ? ?2. Start Sinemet (generic name: carbidopa-levodopa) 25/100 mg: Take half a pill twice daily (8 AM and noon) for one week, then half a pill 3 times a day (8 AM, noon, and 4 PM) for one week, then one pill 3 times  ? ?Please try to take the medication either one hour before or 2 hours after your meal. Side effects may include nausea, vomiting, sedation, confusion, lightheadedness. ? ? Preventing Falls at Home  ?Falls are common, often dreaded events in the lives of older people. Aside from the obvious injuries and even death that may result, fall can cause wide-ranging consequences including loss of independence, mental decline, decreased activity and mobility. Younger people are also at risk of falling, especially those with chronic illnesses and fatigue.  ?Ways to reduce risk for falling  ? Examine diet and medications. Warm foods and alcohol dilate blood vessels, which can lead to dizziness when standing. Sleep aids, antidepressants and pain medications can also increase the likelihood of a fall.  ? Get a vision exam. Poor vision, cataracts and glaucoma increase the chances of falling.  ? Check foot gear. Shoes should fit snugly and have a sturdy, nonskid sole and a broad, low heel  ? Participate in a physician-approved exercise program to build and maintain muscle strength and improve balance and coordination. Programs that use ankle weights or stretch bands are excellent for muscle-strengthening. Water aerobics programs and low-impact Tai Chi programs have also been shown to improve balance and coordination.  ? Increase vitamin D intake. Vitamin D improves muscle strength and increases the amount of calcium the body is able to absorb and deposit in bones.  ?How to prevent falls from common hazards  ? Floors -- Remove all loose wires, cords, and throw rugs. Minimize clutter. Make sure rugs are anchored and smooth. Keep furniture in its usual place.  ? Chairs -- Use chairs with  straight backs, armrests and firm seats. Add firm cushions to existing pieces to add height.  ? Bathroom -- Install grab bars and non-skid tape in the tub or shower. Use a bathtub transfer bench or a shower chair with a back support Use an elevated toilet seat and/or safety rails to assist standing from a low surface. Do not use towel racks or bathroom tissue holders to help you stand.  ? Lighting -- Make sure halls, stairways, and entrances are well-lit. Install a night light in your bathroom or hallway. Make sure there is a light switch at the top and bottom of the staircase. Turn lights on if you get up in the middle of the night. Make sure lamps or light switches are within reach of the bed if you have to get up during the night.  ? Kitchen -- Install non-skid rubber mats near the sink and stove. Clean spills immediately. Store frequently used utensils, pots, pans between waist and eye level. This helps prevent reaching and bending. Sit when getting things out of lower cupboards.  ? Living room / [place] furniture with wide spaces in between, giving enough room to move around. Establish a route through the living room that gives you something to hold onto as you walk.  ? Stairs -- Make sure treads, rails, and rugs are secure. Install a rail on both sides of the stairs. If stairs are a threat, it might be helpful to arrange most of your activities on the lower level to reduce the number of times you must climb  the stairs.  ? Entrances and doorways -- Install metal handles on the walls adjacent to the doorknobs of all doors to make it more secure as you travel through the doorway.  ? ?Tips for maintaining balance  ? Keep at least one hand free at all times. Try using a backpack or fanny pack to hold things rather than carrying them in your hands. Never carry objects in both hands when walking as this interferes with keeping your balance.  ? Attempt to swing both arms from front to back while walking. This  might require a conscious effort if Parkinson's disease has diminished your movement. It will, however, help you to maintain balance and posture, and reduce fatigue.  ? Consciously lift your feet off of the ground when walking. Shuffling and dragging of the feet is a common culprit in losing your balance.  ? When trying to navigate turns, use a "U" technique of facing forward and making a wide turn, rather than pivoting sharply.  ? Try to stand with your feet shoulder-length apart. When your feet are close together for any length of time, you increase your risk of losing your balance and falling.  ? Do one thing at a time. Don't try to walk and accomplish another task, such as reading or looking around. The decrease in your automatic reflexes complicates motor function, so the less distraction, the better.  ? Do not wear rubber or gripping soled shoes, they might "catch" on the floor and cause tripping.  ? Move slowly when changing positions. Use deliberate, concentrated movements and, if needed, use a grab bar or walking aid. Count 15 seconds between each movement. For example, when rising from a seated position, wait 15 seconds after standing to begin walking.  ? If balance is a continuous problem, you might want to consider a walking aid such as a cane, walking stick, or walker. Once you've mastered walking with help, you might be ready to try it on your own again.  ? ? ?

## 2021-08-17 NOTE — Progress Notes (Signed)
? ?[facility] ? ?PATIENT: [name] Juarez ?DOB: 1945/08/03 ? ?REFERRING CLINICIAN: Shirline Frees, MD ?HISTORY FROM: self and wife ?REASON FOR VISIT: memory loss ? ? ?HISTORICAL ? ?CHIEF COMPLAINT:  ?Chief Complaint  ?Patient presents with  ? New Patient (Initial Visit)  ?  Rm 1 with wife- reports he is here to discuss memory loss. Sx have been on going for sometime now but getting worse.   ? ? ?HISTORY OF PRESENT ILLNESS:  ?The patient presents for evaluation of memory loss which has been present over the past 2 years. It has been progressively getting worse over time. His biggest complaint is word finding difficulty. He has trouble keeping up with conversations. He will forget what he is saying in the middle of a conversation.  ? ?His wife has also noticed worsening gait. She states gait is more shuffling. Has a tremor on his right hand which he states started after surgery. Handwriting has become smaller over time. ? ?He had been taking donepezil 10 mg daily for the past 2 years but stopped it because he did not see a difference. Has been taking memantine 10 mg BID for the past year and has not noticed any improvement. ? ?TBI:  No past history of TBI ?Stroke:  Prior asymptomatic right frontal stroke ?Seizures:  no past history of seizures ?Sleep: No sleep issues ?Mood:  patient denies anxiety and depression. He feels frustrated when he can't remember things ? ?Functional status:  ?Patient lives with wife. ?Cooking: wife does most of the cooking. He does grilling without issues. ?Cleaning: no issues ?Shopping: no issues ?Bathing: no issues ?Toileting: no issues ?Driving: He is driving, has not gotten lost or had car accidents ?Bills: wife handles the money ?Medications: no issues ?Ever left the stove on by accident?: no ?Forget how to use items around the house?: no ?Getting lost going to familiar places?: no ?Forgetting loved ones names?: no ?Word finding difficulty? yes ? ?OTHER MEDICAL  CONDITIONS: abdominal aneurysm, HTN, prediabetes, HLD, COPD, PAD, right frontal CVA ? ? ?REVIEW OF SYSTEMS: Full 14 system review of systems performed and negative with exception of: memory loss, word finding difficulty ? ?ALLERGIES: ?No Known Allergies ? ?HOME MEDICATIONS: ?Outpatient Medications Prior to Visit  ?Medication Sig Dispense Refill  ? acetaminophen (TYLENOL) 500 MG tablet Take 2 tablets (1,000 mg total) by mouth every 6 (six) hours as needed for mild pain. 30 tablet 0  ? fexofenadine (ALLEGRA) 180 MG tablet Take 180 mg by mouth daily.    ? ipratropium (ATROVENT) 0.03 % nasal spray Place 2 sprays into both nostrils every 12 (twelve) hours.    ? memantine (NAMENDA) 10 MG tablet Take 10 mg by mouth 2 (two) times daily.    ? donepezil (ARICEPT) 10 MG tablet Take 10 mg by mouth daily.    ? traMADol (ULTRAM) 50 MG tablet Take 1-2 tablets (50-100 mg total) by mouth every 6 (six) hours as needed for moderate pain or severe pain. 20 tablet 0  ? ?No facility-administered medications prior to visit.  ? ? ?PAST MEDICAL HISTORY: ?Past Medical History:  ?Diagnosis Date  ? Acute appendicitis with peritonitis, perforation s/p lap appy 08/03/2013 08/03/2013  ? Chronic kidney disease (CKD) stage G3a/A1, moderately decreased glomerular filtration rate (GFR) between 45-59 mL/min/1.73 square meter and albuminuria creatinine ratio less than 30 mg/g (HCC)   ? ckd stage 3 b  ? Colon polyp 10/2006  ? Colovesical fistula 11/14/2019  ? Dementia ([place])   ? early stage  ?  Erectile dysfunction   ? History of hypertension   ? off all meds x 2 years as of 05-13-2020  ? Hyperlipidemia   ? Inguinal hernia   ? Left  ? Memory loss   ? Peripheral vascular disease ([place])   ? Pre-diabetes   ? Tuberculosis   ? age 81 tx done  ? Varicose veins   ? both legs  ? Wears partial dentures upper and lower  ? ? ?PAST SURGICAL HISTORY: ?Past Surgical History:  ?Procedure Laterality Date  ? ABDOMINAL AORTIC ENDOVASCULAR STENT GRAFT  11/03/2011  ? right leg  ?  COLONOSCOPY  2021  ? CYSTOSCOPY W/ URETERAL STENT PLACEMENT N/A 01/31/2020  ? Procedure: FIREFLY INJECTION;  Surgeon: Janith Lima, MD;  Location: [facility];  Service: Urology;  Laterality: N/A;  ? evar  2013  ? INGUINAL HERNIA REPAIR Right 2000  ? open RIH repair  ? INGUINAL HERNIA REPAIR Left 05/15/2020  ? Procedure: LAPAROSCOPIC LEFT  INGUINAL HERNIA REPAIR with mesh ;  Surgeon: Michael Boston, MD;  Location: [facility];  Service: General;  Laterality: Left;  ? KNEE SURGERY Right   ? LAPAROSCOPIC APPENDECTOMY N/A 08/03/2013  ? Procedure: APPENDECTOMY LAPAROSCOPIC;  Surgeon: Earnstine Regal, MD;  Location: [facility];  Service: General;  Laterality: N/A;  ? LAPAROSCOPIC LOW ANTERIOR RESECTION  10/ 28/2021  ? ? ?FAMILY HISTORY: ?Family History  ?Problem Relation Age of Onset  ? Tuberculosis Father   ? Tuberculosis Sister   ? ? ?SOCIAL HISTORY: ?Social History  ? ?Socioeconomic History  ? Marital status: Married  ?  Spouse name: Not on file  ? Number of children: 2  ? Years of education: Not on file  ? Highest education level: Not on file  ?Occupational History  ? Not on file  ?Tobacco Use  ? Smoking status: Former  ?  Packs/day: 1.00  ?  Years: 45.00  ?  Pack years: 45.00  ?  Types: Cigarettes  ?  Quit date: 11/05/2011  ?  Years since quitting: 9.7  ? Smokeless tobacco: Never  ?Vaping Use  ? Vaping Use: Never used  ?Substance and Sexual Activity  ? Alcohol use: Yes  ?  Comment: 3 per day  ? Drug use: No  ? Sexual activity: Not on file  ?Other Topics Concern  ? Not on file  ?Social History Narrative  ? Originally from [place].  [place], [place]  ?  In [place] since 1970s, lives with wife 08/12/21  ? Right handed  ? 2-3 cups of coffee per day  ? ?Social Determinants of Health  ? ?Financial Resource Strain: Not on file  ?Food Insecurity: Not on file  ?Transportation Needs: Not on file  ?Physical Activity: Not on file  ?Stress: Not on file  ?Social Connections: Not on file  ?Intimate Partner Violence: Not on  file  ? ? ? ?PHYSICAL EXAM ? ?GENERAL EXAM/CONSTITUTIONAL: ?Vitals:  ?Vitals:  ? 08/17/21 0934  ?BP: 128/76  ?Pulse: (!) 59  ?SpO2: 98%  ?Weight: 161 lb (73 kg)  ?Height: 5\' 7"  (1.702 m)  ? ?Body mass index is 25.22 kg/m?. ?Wt Readings from Last 3 Encounters:  ?08/17/21 161 lb (73 kg)  ?05/15/20 152 lb 1.6 oz (69 kg)  ?02/03/20 153 lb (69.4 kg)  ? ?Patient is in no distress; well developed, nourished and groomed; neck is supple ? ?CARDIOVASCULAR: ?Examination of peripheral vascular system by observation and palpation is normal ? ?EYES: ?Pupils round and reactive to light, Visual fields  full to confrontation, Extraocular movements intacts,  ? ?MUSCULOSKELETAL: ?Gait, strength, tone, movements noted in Neurologic exam below ? ?NEUROLOGIC: ?MENTAL STATUS:  ? ?  08/17/2021  ? 10:23 AM  ?Montreal Cognitive Assessment   ?Visuospatial/ Executive (0/5) 2  ?Naming (0/3) 1  ?Attention: Read list of digits (0/2) 2  ?Attention: Read list of letters (0/1) 0  ?Attention: Serial 7 subtraction starting at 100 (0/3) 0  ?Language: Repeat phrase (0/2) 0  ?Language : Fluency (0/1) 0  ?Abstraction (0/2) 1  ?Delayed Recall (0/5) 0  ?Orientation (0/6) 5  ?Total 11  ? ?CRANIAL NERVE:  ?2nd, 3rd, 4th, 6th - pupils equal and reactive to light, visual fields full to confrontation, extraocular muscles intact, no nystagmus ?5th - facial sensation symmetric ?7th - facial strength symmetric ?8th - hearing intact ?9th - palate elevates symmetrically, uvula midline ?11th - shoulder shrug symmetric ?12th - tongue protrusion midline ? ?MOTOR:  ?+mild cogwheeling bilateral upper extremities. 5/5 strength all 4 extremities ? ?SENSORY:  ?normal and symmetric to light touch ? ?COORDINATION:  ?finger-nose-finger normal. +finger tapping slowed on left, toe tapping slowed bilaterally, right intermittent resting tremor ? ?REFLEXES:  ?deep tendon reflexes present and symmetric ? ?GAIT/STATION:  ?Decreased stride length, decreased arm swing ? ? ? ? ?DIAGNOSTIC  DATA (LABS, IMAGING, TESTING) ?- I reviewed patient records, labs, notes, testing and imaging myself where available. ? ?Lab Results  ?Component Value Date  ? WBC 7.5 02/03/2020  ? HGB 11.3 (L) 02/03/2020  ? HCT

## 2021-08-18 LAB — VITAMIN B12: Vitamin B-12: 397 pg/mL (ref 232–1245)

## 2021-11-16 ENCOUNTER — Other Ambulatory Visit: Payer: Self-pay

## 2021-11-16 MED ORDER — CARBIDOPA-LEVODOPA 25-100 MG PO TABS: ORAL_TABLET | ORAL | 2 refills | Status: DC

## 2021-12-08 DIAGNOSIS — Z0000 Encounter for general adult medical examination without abnormal findings: Secondary | ICD-10-CM | POA: Diagnosis not present

## 2021-12-08 DIAGNOSIS — I679 Cerebrovascular disease, unspecified: Secondary | ICD-10-CM | POA: Diagnosis not present

## 2021-12-08 DIAGNOSIS — E7800 Pure hypercholesterolemia, unspecified: Secondary | ICD-10-CM | POA: Diagnosis not present

## 2021-12-08 DIAGNOSIS — R413 Other amnesia: Secondary | ICD-10-CM | POA: Diagnosis not present

## 2021-12-08 DIAGNOSIS — I739 Peripheral vascular disease, unspecified: Secondary | ICD-10-CM | POA: Diagnosis not present

## 2021-12-08 DIAGNOSIS — R7303 Prediabetes: Secondary | ICD-10-CM | POA: Diagnosis not present

## 2021-12-08 DIAGNOSIS — N183 Chronic kidney disease, stage 3 unspecified: Secondary | ICD-10-CM | POA: Diagnosis not present

## 2021-12-08 DIAGNOSIS — Z23 Encounter for immunization: Secondary | ICD-10-CM | POA: Diagnosis not present

## 2021-12-25 DIAGNOSIS — B029 Zoster without complications: Secondary | ICD-10-CM | POA: Diagnosis not present

## 2021-12-25 DIAGNOSIS — R21 Rash and other nonspecific skin eruption: Secondary | ICD-10-CM | POA: Diagnosis not present

## 2021-12-26 ENCOUNTER — Other Ambulatory Visit: Payer: Self-pay

## 2021-12-26 ENCOUNTER — Encounter (HOSPITAL_COMMUNITY): Payer: Self-pay | Admitting: Emergency Medicine

## 2021-12-26 ENCOUNTER — Inpatient Hospital Stay (HOSPITAL_COMMUNITY)
Admission: EM | Admit: 2021-12-26 | Discharge: 2022-01-02 | DRG: 683 | Disposition: A | Discharge status: Skilled Nursing Facility | Payer: Medicare Other | Attending: Internal Medicine | Admitting: Internal Medicine

## 2021-12-26 DIAGNOSIS — B029 Zoster without complications: Secondary | ICD-10-CM | POA: Diagnosis present

## 2021-12-26 DIAGNOSIS — Z8673 Personal history of transient ischemic attack (TIA), and cerebral infarction without residual deficits: Secondary | ICD-10-CM | POA: Diagnosis not present

## 2021-12-26 DIAGNOSIS — G259 Extrapyramidal and movement disorder, unspecified: Secondary | ICD-10-CM | POA: Diagnosis present

## 2021-12-26 DIAGNOSIS — R059 Cough, unspecified: Secondary | ICD-10-CM | POA: Diagnosis not present

## 2021-12-26 DIAGNOSIS — F02B0 Dementia in other diseases classified elsewhere, moderate, without behavioral disturbance, psychotic disturbance, mood disturbance, and anxiety: Secondary | ICD-10-CM | POA: Diagnosis present

## 2021-12-26 DIAGNOSIS — D631 Anemia in chronic kidney disease: Secondary | ICD-10-CM | POA: Diagnosis not present

## 2021-12-26 DIAGNOSIS — Z87891 Personal history of nicotine dependence: Secondary | ICD-10-CM

## 2021-12-26 DIAGNOSIS — L299 Pruritus, unspecified: Secondary | ICD-10-CM | POA: Diagnosis present

## 2021-12-26 DIAGNOSIS — R1310 Dysphagia, unspecified: Secondary | ICD-10-CM | POA: Diagnosis not present

## 2021-12-26 DIAGNOSIS — N189 Chronic kidney disease, unspecified: Secondary | ICD-10-CM | POA: Diagnosis present

## 2021-12-26 DIAGNOSIS — Z789 Other specified health status: Secondary | ICD-10-CM

## 2021-12-26 DIAGNOSIS — G20 Parkinson's disease: Secondary | ICD-10-CM | POA: Diagnosis present

## 2021-12-26 DIAGNOSIS — J449 Chronic obstructive pulmonary disease, unspecified: Secondary | ICD-10-CM | POA: Diagnosis present

## 2021-12-26 DIAGNOSIS — I129 Hypertensive chronic kidney disease with stage 1 through stage 4 chronic kidney disease, or unspecified chronic kidney disease: Secondary | ICD-10-CM | POA: Diagnosis present

## 2021-12-26 DIAGNOSIS — R531 Weakness: Secondary | ICD-10-CM | POA: Diagnosis not present

## 2021-12-26 DIAGNOSIS — N1832 Chronic kidney disease, stage 3b: Secondary | ICD-10-CM | POA: Diagnosis not present

## 2021-12-26 DIAGNOSIS — Z8616 Personal history of COVID-19: Secondary | ICD-10-CM | POA: Diagnosis not present

## 2021-12-26 DIAGNOSIS — I739 Peripheral vascular disease, unspecified: Secondary | ICD-10-CM | POA: Diagnosis present

## 2021-12-26 DIAGNOSIS — E785 Hyperlipidemia, unspecified: Secondary | ICD-10-CM | POA: Diagnosis present

## 2021-12-26 DIAGNOSIS — E860 Dehydration: Secondary | ICD-10-CM | POA: Diagnosis not present

## 2021-12-26 DIAGNOSIS — R262 Difficulty in walking, not elsewhere classified: Secondary | ICD-10-CM | POA: Diagnosis present

## 2021-12-26 DIAGNOSIS — Z79899 Other long term (current) drug therapy: Secondary | ICD-10-CM

## 2021-12-26 DIAGNOSIS — R293 Abnormal posture: Secondary | ICD-10-CM | POA: Diagnosis not present

## 2021-12-26 DIAGNOSIS — N179 Acute kidney failure, unspecified: Secondary | ICD-10-CM | POA: Diagnosis not present

## 2021-12-26 DIAGNOSIS — Z20822 Contact with and (suspected) exposure to covid-19: Secondary | ICD-10-CM | POA: Diagnosis present

## 2021-12-26 DIAGNOSIS — Z743 Need for continuous supervision: Secondary | ICD-10-CM | POA: Diagnosis not present

## 2021-12-26 DIAGNOSIS — M2011 Hallux valgus (acquired), right foot: Secondary | ICD-10-CM | POA: Diagnosis not present

## 2021-12-26 DIAGNOSIS — Z831 Family history of other infectious and parasitic diseases: Secondary | ICD-10-CM | POA: Diagnosis not present

## 2021-12-26 DIAGNOSIS — Z86010 Personal history of colonic polyps: Secondary | ICD-10-CM | POA: Diagnosis not present

## 2021-12-26 DIAGNOSIS — R1312 Dysphagia, oropharyngeal phase: Secondary | ICD-10-CM | POA: Diagnosis not present

## 2021-12-26 DIAGNOSIS — I10 Essential (primary) hypertension: Secondary | ICD-10-CM | POA: Diagnosis present

## 2021-12-26 LAB — COMPREHENSIVE METABOLIC PANEL
ALT: 17 U/L (ref 0–44)
AST: 19 U/L (ref 15–41)
Albumin: 3.8 g/dL (ref 3.5–5.0)
Alkaline Phosphatase: 37 U/L — ABNORMAL LOW (ref 38–126)
Anion gap: 6 (ref 5–15)
BUN: 35 mg/dL — ABNORMAL HIGH (ref 8–23)
CO2: 22 mmol/L (ref 22–32)
Calcium: 9.1 mg/dL (ref 8.9–10.3)
Chloride: 113 mmol/L — ABNORMAL HIGH (ref 98–111)
Creatinine, Ser: 1.92 mg/dL — ABNORMAL HIGH (ref 0.61–1.24)
GFR, Estimated: 35 mL/min — ABNORMAL LOW (ref >60)
Glucose, Bld: 122 mg/dL — ABNORMAL HIGH (ref 70–99)
Potassium: 4.3 mmol/L (ref 3.5–5.1)
Sodium: 141 mmol/L (ref 135–145)
Total Bilirubin: 0.9 mg/dL (ref 0.3–1.2)
Total Protein: 7.4 g/dL (ref 6.5–8.1)

## 2021-12-26 LAB — CBC WITH DIFFERENTIAL/PLATELET
Abs Immature Granulocytes: 0.04 10*3/uL (ref 0.00–0.07)
Basophils Absolute: 0 10*3/uL (ref 0.0–0.1)
Basophils Relative: 1 %
Eosinophils Absolute: 0 10*3/uL (ref 0.0–0.5)
Eosinophils Relative: 0 %
HCT: 37.8 % — ABNORMAL LOW (ref 39.0–52.0)
Hemoglobin: 11.8 g/dL — ABNORMAL LOW (ref 13.0–17.0)
Immature Granulocytes: 1 %
Lymphocytes Relative: 13 %
Lymphs Abs: 1 10*3/uL (ref 0.7–4.0)
MCH: 29.9 pg (ref 26.0–34.0)
MCHC: 31.2 g/dL (ref 30.0–36.0)
MCV: 95.7 fL (ref 80.0–100.0)
Monocytes Absolute: 0.8 10*3/uL (ref 0.1–1.0)
Monocytes Relative: 11 %
Neutro Abs: 5.6 10*3/uL (ref 1.7–7.7)
Neutrophils Relative %: 74 %
Platelets: 183 10*3/uL (ref 150–400)
RBC: 3.95 MIL/uL — ABNORMAL LOW (ref 4.22–5.81)
RDW: 13 % (ref 11.5–15.5)
WBC: 7.5 10*3/uL (ref 4.0–10.5)
nRBC: 0 % (ref 0.0–0.2)

## 2021-12-26 LAB — URINALYSIS, ROUTINE W REFLEX MICROSCOPIC
Bacteria, UA: NONE SEEN
Bilirubin Urine: NEGATIVE
Glucose, UA: NEGATIVE mg/dL
Hgb urine dipstick: NEGATIVE
Ketones, ur: NEGATIVE mg/dL
Leukocytes,Ua: NEGATIVE
Nitrite: NEGATIVE
Protein, ur: 30 mg/dL — AB
Specific Gravity, Urine: 1.02 (ref 1.005–1.030)
pH: 5 (ref 5.0–8.0)

## 2021-12-26 MED ORDER — LACTATED RINGERS IV SOLN: INTRAVENOUS | Status: DC

## 2021-12-26 MED ORDER — ACETAMINOPHEN 325 MG PO TABS
650.0000 mg | ORAL_TABLET | Freq: Four times a day (QID) | ORAL | Status: DC | PRN
  Administered 2021-12-29 – 2021-12-31 (×2): 650 mg via ORAL
  Filled 2021-12-26 (×3): qty 2

## 2021-12-26 MED ORDER — LACTATED RINGERS IV BOLUS
1000.0000 mL | Freq: Once | INTRAVENOUS | Status: AC
  Administered 2021-12-26: 1000 mL via INTRAVENOUS

## 2021-12-26 MED ORDER — CARBIDOPA-LEVODOPA 25-100 MG PO TABS
1.0000 | ORAL_TABLET | Freq: Three times a day (TID) | ORAL | Status: DC
  Administered 2021-12-26 – 2022-01-02 (×20): 1 via ORAL
  Filled 2021-12-26 (×21): qty 1

## 2021-12-26 MED ORDER — ACETAMINOPHEN 650 MG RE SUPP: 650.0000 mg | Freq: Four times a day (QID) | RECTAL | Status: DC | PRN

## 2021-12-26 MED ORDER — ENOXAPARIN SODIUM 40 MG/0.4ML IJ SOSY
40.0000 mg | PREFILLED_SYRINGE | Freq: Every day | INTRAMUSCULAR | Status: DC
  Administered 2021-12-26 – 2022-01-01 (×7): 40 mg via SUBCUTANEOUS
  Filled 2021-12-26 (×7): qty 0.4

## 2021-12-26 MED ORDER — SENNOSIDES-DOCUSATE SODIUM 8.6-50 MG PO TABS: 1.0000 | ORAL_TABLET | Freq: Every evening | ORAL | Status: DC | PRN

## 2021-12-26 MED ORDER — ALBUTEROL SULFATE (2.5 MG/3ML) 0.083% IN NEBU: 2.5000 mg | INHALATION_SOLUTION | RESPIRATORY_TRACT | Status: DC | PRN

## 2021-12-26 MED ORDER — MEMANTINE HCL 10 MG PO TABS
10.0000 mg | ORAL_TABLET | Freq: Two times a day (BID) | ORAL | Status: DC
  Administered 2021-12-26 – 2022-01-02 (×14): 10 mg via ORAL
  Filled 2021-12-26 (×14): qty 1

## 2021-12-26 MED ORDER — VALACYCLOVIR HCL 500 MG PO TABS
1000.0000 mg | ORAL_TABLET | Freq: Two times a day (BID) | ORAL | Status: DC
  Administered 2021-12-26 – 2022-01-02 (×14): 1000 mg via ORAL
  Filled 2021-12-26 (×14): qty 2

## 2021-12-26 NOTE — H&P (Signed)
History and Physical    [name] Juarez [id number] DOB: Jun 14, 1945 DOA: 12/26/2021  PCP: Shirline Frees, MD   I have briefly reviewed patients previous medical reports in [facility].  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  Unable to get much history from patient due to his dementia.  History obtained from patient and spouse at bedside.  He developed some pruritus in his left groin midweek of hospital admission.  On 9/21 night, he developed pruritic rash on his left groin.  9/22, he saw his PCP in the afternoon and was started on Valtrex 1 g 3 times daily for shingles.  He took a dose yesterday afternoon and 1 last night.  At approximately 8 PM on night prior to admission, he reported feeling cold and had significant tremor/rigors, his wife assisted him to a reclining chair and covered him with blankets.  He felt better after about 2 hours.  He then wanted to get up and go to the bathroom but was weak and with the help of a cane, according to wife, it took them 10 minutes to go to the bathroom and then to his bedroom.  He had an uneventful night last night and slept well.  This morning he was profoundly weak and unable to get out of bed when it appeared that his knees were locked in place.  She reports similar symptoms when he had COVID about a year or 2 ago when they traveled to [place], [place].  No reported history of headache, earache, sore throat, fever, cough, dyspnea, pain, nausea, vomiting or diarrhea.  He has a good appetite.  ED Course: Afebrile,  transiently mildly tachypneic, mild bradycardia, normotensive and not hypoxic.  Clinically dehydrated.  Lab work significant for BUN 35, creatinine 1.92, hemoglobin 11.8, urine microscopy without features of UTI.  He has received 2 L of IV fluid bolus.  Failed attempted ambulation in ED.  Hospital admission requested.  Review of Systems:  All other systems reviewed and apart from HPI, are negative.  Past Medical History:  Diagnosis Date   Acute appendicitis with peritonitis, perforation s/p lap appy 08/03/2013 08/03/2013   Chronic kidney disease (CKD) stage G3a/A1, moderately decreased glomerular filtration rate (GFR) between 45-59 mL/min/1.73 square meter and albuminuria creatinine ratio less than 30 mg/g (HCC)    ckd stage 3 b   Colon polyp 10/2006   Colovesical fistula 11/14/2019   Dementia ([place])    early stage   Erectile dysfunction    History of hypertension    off all meds x 2 years as of 05-13-2020   Hyperlipidemia    Inguinal hernia    Left   Memory loss    Peripheral vascular disease ([place])    Pre-diabetes    Tuberculosis    age 87 tx done   Varicose veins    both legs   Wears partial dentures upper and lower    Past Surgical History:  Procedure Laterality Date   ABDOMINAL AORTIC ENDOVASCULAR STENT GRAFT  11/03/2011   right leg   COLONOSCOPY  2021   CYSTOSCOPY W/ URETERAL STENT PLACEMENT N/A 01/31/2020   Procedure: FIREFLY INJECTION;  Surgeon: Janith Lima, MD;  Location: [facility];  Service: Urology;  Laterality: N/A;   evar  2013   INGUINAL HERNIA REPAIR Right 2000   open RIH repair   INGUINAL HERNIA REPAIR Left 05/15/2020   Procedure: LAPAROSCOPIC LEFT  INGUINAL HERNIA REPAIR with mesh ;  Surgeon: Michael Boston, MD;  Location: [facility];  Service: General;  Laterality: Left;   KNEE SURGERY Right    LAPAROSCOPIC APPENDECTOMY N/A 08/03/2013   Procedure: APPENDECTOMY LAPAROSCOPIC;  Surgeon: Earnstine Regal, MD;  Location: [facility];  Service: General;   Laterality: N/A;   LAPAROSCOPIC LOW ANTERIOR RESECTION  10/ 28/2021    Social History  reports that he quit smoking about 10 years ago. His smoking use included cigarettes. He has a 45.00 pack-year smoking history. He has never used smokeless tobacco. He reports current alcohol use. He reports that he does not use drugs.  No Known Allergies  Family History  Problem Relation Age of Onset   Tuberculosis Father    Tuberculosis Sister      Prior to Admission medications   Medication Sig Start Date End Date Taking? Authorizing Provider  acetaminophen (TYLENOL) 500 MG tablet Take 2 tablets (1,000 mg total) by mouth every 6 (six) hours as needed for mild pain. 02/03/20   Dwan Bolt, MD  carbidopa-levodopa (SINEMET IR) 25-100 MG tablet Take half a pill twice daily (8 AM and noon) for one week, then half a pill 3 times a day (8 AM, noon, and 4 PM) for one week, then one pill 3 times a day 11/16/21   Genia Harold, MD  fexofenadine (ALLEGRA) 180 MG tablet Take 180 mg by mouth daily.    [provider]  ipratropium (ATROVENT) 0.03 % nasal spray Place 2 sprays into both nostrils every 12 (twelve) hours.    [provider]  memantine (NAMENDA) 10 MG tablet Take 10 mg by mouth 2 (two) times daily.    [provider]    Physical Exam: Vitals:   12/26/21 1234 12/26/21 1430 12/26/21 1530  BP: (!) 102/47 117/88 111/65  Pulse: 60 64 (!) 55  Resp: 18 (!) 24 (!) 22  Temp: 98.8 F (37.1 C)    TempSrc: Oral    SpO2: 96% 97% 100%      Constitutional: Elderly male, moderately built and nourished lying comfortably propped up in bed without distress. Eyes: PERTLA, lids and conjunctivae normal ENMT: Mucous membranes are dry. Posterior pharynx clear of any exudate or lesions. Normal dentition.  Neck: supple, no masses, no thyromegaly Respiratory: Clear to auscultation without wheezing, rhonchi or crackles. No increased work of breathing. Cardiovascular: S1 & S2 heard,  regular rate and rhythm. No JVD, murmurs, rubs or clicks. No pedal edema. Abdomen: Non distended. Non tender. Soft. No organomegaly or masses appreciated. No clinical Ascites. Normal bowel sounds heard. Musculoskeletal: no clubbing / cyanosis. No joint deformity upper and lower extremities. Good ROM, no contractures. Normal muscle tone.  Skin: Vesicular rash with some erythema across the left groin and medial thigh, extending up to the left anterior hip.  Likely L1 or L2 dermatome.  Suspicious for shingles. Neurologic: CN 2-12 grossly intact. Sensation intact, DTR normal. Strength 5/5 in all 4 limbs.  Psychiatric: Impaired judgment and insight. Alert and oriented x only to self. Normal mood.     Labs on Admission: I have personally reviewed following labs and imaging studies  CBC: Recent Labs  Lab 12/26/21 1317  WBC 7.5  NEUTROABS  5.6  HGB 11.8*  HCT 37.8*  MCV 95.7  PLT 182    Basic Metabolic Panel: Recent Labs  Lab 12/26/21 1317  NA 141  K 4.3  CL 113*  CO2 22  GLUCOSE 122*  BUN 35*  CREATININE 1.92*  CALCIUM 9.1    Liver Function Tests: Recent Labs  Lab 12/26/21 1317  AST 19  ALT 17  ALKPHOS 37*  BILITOT 0.9  PROT 7.4  ALBUMIN 3.8    Urine analysis:    Component Value Date/Time   COLORURINE YELLOW 12/26/2021 1505   APPEARANCEUR CLEAR 12/26/2021 1505   LABSPEC 1.020 12/26/2021 1505   PHURINE 5.0 12/26/2021 1505   GLUCOSEU NEGATIVE 12/26/2021 1505   HGBUR NEGATIVE 12/26/2021 1505   BILIRUBINUR NEGATIVE 12/26/2021 1505   KETONESUR NEGATIVE 12/26/2021 1505   PROTEINUR 30 (A) 12/26/2021 1505   UROBILINOGEN 0.2 11/01/2011 1522   NITRITE NEGATIVE 12/26/2021 1505   LEUKOCYTESUR NEGATIVE 12/26/2021 1505     Radiological Exams on Admission: No results found.  EKG: Independently reviewed.  Sinus rhythm at 61 bpm,?  Right axis deviation, no acute changes, normal QTc.  Assessment/Plan Principal Problem:   Acute kidney injury superimposed on chronic  kidney disease ([place]) Active Problems:   Hypertension   Shingles     Acute kidney injury complicating stage IIIa CKD secondary to dehydration in the context of recently diagnosed shingles: Last known creatinine 1.37 on 02/03/2020.  Most recent baseline not known.  His CKD may have progressed.  Has received 2 L IV fluid bolus in ED.  Treat with gentle IV fluids overnight and follow BMP in AM.  Dehydration: As evidenced by disproportionate elevation of BUN compared to creatinine.  IV fluids as above.  Generalized weakness/physical deconditioning: Secondary to shingles complicating underlying moderate dementia with some parkinsonian features.  Treat underlying etiology as above.  PT and OT evaluation  Herpes zoster/shingles: Of L1 or L2 dermatome.  No pain or pruritus.  Continue Valtrex.  Airborne/contact isolation.  Essential hypertension: Controlled.  Resume home meds after reconciliation by pharmacy.  Hyperlipidemia: Resume statins after medication reconciliation by pharmacy.  Moderate dementia: Delirium precautions.  Patients daughter plans to stay with him overnight which will definitely help.  Resume Sinemet after pharmacy has done his medication reconciliation.  Although wife is not certain if the Sinemet is helping or not.   DVT prophylaxis: Lovenox Code Status: Full, confirmed with patient's wife at bedside. Family Communication: Wife at bedside Disposition Plan:   Patient is from:  Home  Anticipated DC to:  Home  Anticipated DC date:  12/27/2021  Anticipated DC barriers: None   Consults called: None Admission status: Observation, medical bed  Severity of Illness: The appropriate patient status for this patient is OBSERVATION. Observation status is judged to be reasonable and necessary in order to provide the required intensity of service to ensure the patient's safety. The patient's presenting symptoms, physical exam findings, and initial radiographic and laboratory data in  the context of their medical condition is felt to place them at decreased risk for further clinical deterioration. Furthermore, it is anticipated that the patient will be medically stable for discharge from the hospital within 2 midnights of admission.     Vernell Leep MD FACP, [place], [place], [facility], [facility]  [facility] [place]  To contact the attending provider between 7A-7P or the covering provider during after hours 7P-7A, please log into the web site [email or web address] and access using universal [place] password for that web  site. If you do not have the password, please call the hospital operator.  12/26/2021, 5:20 PM

## 2021-12-26 NOTE — ED Notes (Signed)
Bladder scan completed with 383 mL of urine noted in bladder

## 2021-12-26 NOTE — ED Provider Notes (Signed)
[facility]-EMERGENCY DEPT Provider Note   CSN: [id number] Arrival date & time: 12/26/21  1223     History  Chief Complaint  Patient presents with   Weakness   Herpes Zoster    [name] Juarez is a 76 y.o. male.  Patient is a 76 year old male with a past medical history of dementia ANO x1-2 at baseline, hypertension, hyperlipidemia presenting to the emergency department with weakness.  Patient is here with his wife who states that he had a rash on his right side that he noticed earlier this week.  They state that he saw his PCP yesterday and was diagnosed with shingles and started on an antiviral.  She states that he took the first dose of medication yesterday and in the evening started to develop tremors and weakness.  She states that normally he walks on his own or with a cane but has having difficulty walking due to the weakness and states that this morning he was unable to get out of bed.  The patient states that he is feeling weak in his bilateral legs.  He denies any numbness or tingling, headaches, neck pain or back pain.  He denies any dysuria or hematuria but his wife reports that she does not believe he has urinated since last night.  He denies any fevers or chills, diarrhea or constipation.  He states that he has had less of an appetite though and has not ate since dinner last night.  The history is provided by the patient and the spouse. History limited by: Dementia.  Weakness      Home Medications Prior to Admission medications   Medication Sig Start Date End Date Taking? Authorizing Provider  acetaminophen (TYLENOL) 500 MG tablet Take 2 tablets (1,000 mg total) by mouth every 6 (six) hours as needed for mild pain. 02/03/20   Fritzi Mandes, MD  carbidopa-levodopa (SINEMET IR) 25-100 MG tablet Take half a pill twice daily (8 AM and noon) for one week, then half a pill 3 times a day (8 AM, noon, and 4 PM) for one week, then one pill 3 times a day 11/16/21    Ocie Doyne, MD  fexofenadine (ALLEGRA) 180 MG tablet Take 180 mg by mouth daily.    [provider]  ipratropium (ATROVENT) 0.03 % nasal spray Place 2 sprays into both nostrils every 12 (twelve) hours.    [provider]  memantine (NAMENDA) 10 MG tablet Take 10 mg by mouth 2 (two) times daily.    [provider]      Allergies    Patient has no known allergies.    Review of Systems   Review of Systems  Neurological:  Positive for weakness.    Physical Exam Updated Vital Signs BP (!) 102/47 (BP Location: Left Arm)   Pulse 60   Temp 98.8 F (37.1 C) (Oral)   Resp 18   SpO2 96%  Physical Exam Vitals and nursing note reviewed.  Constitutional:      General: He is not in acute distress.    Appearance: Normal appearance.  HENT:     Head: Normocephalic and atraumatic.     Nose: Nose normal.     Mouth/Throat:     Mouth: Mucous membranes are dry.     Pharynx: Oropharynx is clear.  Eyes:     Extraocular Movements: Extraocular movements intact.     Conjunctiva/sclera: Conjunctivae normal.     Pupils: Pupils are equal, round, and reactive to light.  Cardiovascular:  Rate and Rhythm: Normal rate and regular rhythm.     Pulses: Normal pulses.     Heart sounds: Normal heart sounds.  Pulmonary:     Effort: Pulmonary effort is normal.     Breath sounds: Normal breath sounds.  Abdominal:     General: Abdomen is flat.     Palpations: Abdomen is soft.     Tenderness: There is no abdominal tenderness.  Musculoskeletal:        General: Normal range of motion.     Cervical back: Normal range of motion and neck supple. No rigidity.     Right lower leg: No edema.     Left lower leg: No edema.     Comments: No midline back tenderness  Skin:    General: Skin is warm and dry.  Neurological:     Mental Status: He is alert.     Comments: Oriented to person and place 4 out of 5 strength in all 4 extremities Sensation intact Downgoing Babinski  bilaterally  Psychiatric:        Mood and Affect: Mood normal.        Behavior: Behavior normal.     ED Results / Procedures / Treatments   Labs (all labs ordered are listed, but only abnormal results are displayed) Labs Reviewed  COMPREHENSIVE METABOLIC PANEL  CBC WITH DIFFERENTIAL/PLATELET  URINALYSIS, ROUTINE W REFLEX MICROSCOPIC    EKG None  Radiology No results found.  Procedures Procedures    Medications Ordered in ED Medications  lactated ringers bolus 1,000 mL (has no administration in time range)    ED Course/ Medical Decision Making/ A&P Clinical Course as of 12/26/21 1602  Sat Dec 26, 2021  1345 EKG normal sinus rhythm, possible u waves. Labs pending at this time. [VK]  1356 Bladder scan with 383 in the bladder, will give IVF and reassess if patient is able to urinate on his own. If he is unable to, may need catheter. [VK]  1509 Patient's labs consistent with dehydration with a creatinine of 1.9 from baseline of 1.3 mildly elevated chloride and BUN.  He has received 1 L of IV fluid.  He was able to urinate and urine is pending at this time. [VK]  1556 Patient was unable to ambulate and will be admitted for his AKI and generalized weakness. [VK]    Clinical Course User Index [VK] Phoebe Sharps, DO                           Medical Decision Making This patient presents to the ED with chief complaint(s) of generalized weakness with pertinent past medical history of dementia, HTN, HLD, recent diagnosis of shingles which further complicates the presenting complaint. The complaint involves an extensive differential diagnosis and also carries with it a high risk of complications and morbidity.    The differential diagnosis includes dehydration, anemia, electrolyte abnormality, arrhythmia, patient has no focal neurologic deficits making CVA unlikely, the patient has a normal mental status with no headaches or neck pains making encephalitis or meningitis  unlikely, patient's declining any back pain and has no focal deficits making spinal cord etiology unlikely however is reporting urinary retention  Additional history obtained: Additional history obtained from family Records reviewed outpatient neurology records  ED Course and Reassessment: Upon patient's arrival to the emergency department he is mildly dehydrated appearing with dry mucous membranes and will be started on IV fluids.  He will have labs and  EKG performed to assess the cause of his generalized weakness as well as a bladder scan for concern of possible urinary retention.  Patient will be reassessed to determine possible need for additional work-up.  Independent labs interpretation:  The following labs were independently interpreted: mild AKI Cr 1.8, otherwise within normal range  Independent visualization of imaging: N/A  Consultation: - Consulted or discussed management/test interpretation w/ external professional: Hospitalist  Consideration for admission or further workup: Patient requires admission for further management of his AKI and weakness Social Determinants of health: N/A    Amount and/or Complexity of Data Reviewed Labs: ordered.  Risk Decision regarding hospitalization.          Final Clinical Impression(s) / ED Diagnoses Final diagnoses:  None    Rx / DC Orders ED Discharge Orders     None         Ottie Glazier, DO 12/26/21 1630

## 2021-12-26 NOTE — ED Triage Notes (Signed)
Pt BIB EMS from home, c/o generalized weakness r/t shingles. Pt dx yesterday at MD office, experiencing increased weakness and cold chills since yesterday.   BP 108/62 HR 66 spO2 93% RA CBG 132

## 2021-12-27 DIAGNOSIS — N189 Chronic kidney disease, unspecified: Secondary | ICD-10-CM | POA: Diagnosis not present

## 2021-12-27 DIAGNOSIS — B029 Zoster without complications: Secondary | ICD-10-CM | POA: Diagnosis not present

## 2021-12-27 DIAGNOSIS — N179 Acute kidney failure, unspecified: Secondary | ICD-10-CM | POA: Diagnosis not present

## 2021-12-27 DIAGNOSIS — R531 Weakness: Secondary | ICD-10-CM | POA: Diagnosis not present

## 2021-12-27 LAB — CBC
HCT: 32.7 % — ABNORMAL LOW (ref 39.0–52.0)
Hemoglobin: 10.4 g/dL — ABNORMAL LOW (ref 13.0–17.0)
MCH: 30.2 pg (ref 26.0–34.0)
MCHC: 31.8 g/dL (ref 30.0–36.0)
MCV: 95.1 fL (ref 80.0–100.0)
Platelets: 153 10*3/uL (ref 150–400)
RBC: 3.44 MIL/uL — ABNORMAL LOW (ref 4.22–5.81)
RDW: 13.3 % (ref 11.5–15.5)
WBC: 8.4 10*3/uL (ref 4.0–10.5)
nRBC: 0 % (ref 0.0–0.2)

## 2021-12-27 LAB — BASIC METABOLIC PANEL
Anion gap: 8 (ref 5–15)
BUN: 36 mg/dL — ABNORMAL HIGH (ref 8–23)
CO2: 21 mmol/L — ABNORMAL LOW (ref 22–32)
Calcium: 8.4 mg/dL — ABNORMAL LOW (ref 8.9–10.3)
Chloride: 110 mmol/L (ref 98–111)
Creatinine, Ser: 1.65 mg/dL — ABNORMAL HIGH (ref 0.61–1.24)
GFR, Estimated: 43 mL/min — ABNORMAL LOW (ref >60)
Glucose, Bld: 135 mg/dL — ABNORMAL HIGH (ref 70–99)
Potassium: 3.8 mmol/L (ref 3.5–5.1)
Sodium: 139 mmol/L (ref 135–145)

## 2021-12-27 MED ORDER — LOPERAMIDE HCL 2 MG PO CAPS
2.0000 mg | ORAL_CAPSULE | ORAL | Status: DC | PRN
  Administered 2021-12-27 (×2): 2 mg via ORAL
  Filled 2021-12-27 (×2): qty 1

## 2021-12-27 NOTE — Progress Notes (Signed)
Inpatient Rehab Admissions Coordinator:  Consult received. Note pt under observation status at this time. Pt may not have the medical necessity to warrant an inpatient rehab stay if they remain observation. If status were to change a member of the Admission Coordinator team will assess for candidacy.   Gayland Curry, [place], [place] Admissions Coordinator [phone]

## 2021-12-27 NOTE — Evaluation (Signed)
Occupational Therapy Evaluation Patient Details Name: [name] Juarez MRN: [id number] DOB: 25-Feb-1946 Today's Date: 12/27/2021   History of Present Illness Patient is 76 year old male admitted for acute renal failure. 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    Clinical Impression   Mr. [name] Juarez is a 76 year old male that presents with above medical history. Prior to hospitalization patient was Mod I for ADLs using cane intermittently for ambulation. On evaluation patient presents with generalized weakness,  decreased coordination, poor activity tolerance, and impaired balance. Patient Mod A for supine to sit and Min A for sit to stand from elevated bed height. Systolic BP 99 in sitting and 94 in standing. Patient Mod A for stand pivot transfer to chair and required multimodal cueing for walker management and ambulation-exhibiting a shuffling gait. Patient Mod-Max A for most ADLs. Patient will benefit from skilled OT services while in hospital in order to improve deficits in order to return to PLOF. Recommend AIR for aggressive short term rehab due to patient's prior level of independence.      Recommendations for follow up therapy are one component of a multi-disciplinary discharge planning process, led by the attending physician.  Recommendations may be updated based on patient status, additional functional criteria and insurance authorization.   Follow Up Recommendations  Acute inpatient rehab (3hours/day)    Assistance Recommended at Discharge Frequent or constant Supervision/Assistance  Patient can return home with the following A lot of help with bathing/dressing/bathroom;A lot of help with walking and/or transfers;Assistance with feeding;Assist for transportation;Direct supervision/assist for financial management;Assistance with cooking/housework;Help with stairs or  ramp for entrance;Direct supervision/assist for medications management    Functional Status Assessment  Patient has had a recent decline in their functional status and demonstrates the ability to make significant improvements in function in a reasonable and predictable amount of time.  Equipment Recommendations  None recommended by OT    Recommendations for Other Services Rehab consult     Precautions / Restrictions        Mobility Bed Mobility Overal bed mobility: Needs Assistance Bed Mobility: Supine to Sit     Supine to sit: HOB elevated, Mod assist          Transfers Overall transfer level: Needs assistance Equipment used: Rolling walker (2 wheels) Transfers: Bed to chair/wheelchair/BSC       Step pivot transfers: From elevated surface, Mod assist     General transfer comment: Patient required multimodal cueing for weight shifting and walker managment.      Balance Overall balance assessment: Needs assistance Sitting-balance support: Bilateral upper extremity supported, Feet supported Sitting balance-Leahy Scale: Poor     Standing balance support: During functional activity, Reliant on assistive device for balance, Bilateral upper extremity supported Standing balance-Leahy Scale: Poor                             ADL either performed or assessed with clinical judgement   ADL Overall ADL's : Needs assistance/impaired Eating/Feeding: Set up;Sitting   Grooming: Sitting;Minimal assistance   Upper Body Bathing: Sitting;Moderate assistance   Lower Body Bathing: Sit to/from stand;Maximal assistance   Upper Body Dressing : Sitting;Moderate assistance   Lower Body Dressing: Sit to/from stand;Maximal assistance   Toilet Transfer: BSC/3in1;Rolling walker (2 wheels);Moderate assistance;Cueing for sequencing   Toileting- Clothing Manipulation and Hygiene: Maximal assistance;Sit to/from stand;Cueing for sequencing  Vision    Vision Assessment?: No apparent visual deficits     Perception     Praxis      Pertinent Vitals/Pain Pain Assessment Pain Assessment: No/denies pain     Hand Dominance Right   Extremity/Trunk Assessment Upper Extremity Assessment Upper Extremity Assessment: Overall WFL for tasks assessed           Communication Communication Communication: No difficulties   Cognition Arousal/Alertness: Awake/alert Behavior During Therapy: WFL for tasks assessed/performed Overall Cognitive Status: History of cognitive impairments - at baseline                                       General Comments       Exercises     Shoulder Instructions      Home Living Family/patient expects to be discharged to:: Skilled nursing facility Living Arrangements: Spouse/significant other Available Help at Discharge: Family Type of Home: House Home Access: Stairs to enter Technical brewer of Steps: 2 Entrance Stairs-Rails: None Home Layout: One level     Bathroom Shower/Tub: Occupational psychologist: Standard (has a Engineer, agricultural)     Home Equipment: Conservation officer, nature (2 wheels);Cane - quad;Shower seat - built in          Prior Functioning/Environment Prior Level of Function : Independent/Modified Independent                        OT Problem List: Decreased strength;Decreased coordination;Decreased knowledge of precautions;Decreased activity tolerance;Decreased cognition;Decreased safety awareness;Impaired balance (sitting and/or standing)      OT Treatment/Interventions: Self-care/ADL training;Therapeutic exercise;Patient/family education;Therapeutic activities    OT Goals(Current goals can be found in the care plan section) Acute Rehab OT Goals Patient Stated Goal: To get better OT Goal Formulation: With patient/family Time For Goal Achievement: 01/10/22 Potential to Achieve Goals: Good ADL Goals Pt Will Perform Lower Body Dressing: with min  guard assist;sit to/from stand Pt Will Transfer to Toilet: with min guard assist;ambulating;grab bars;bedside commode;regular height toilet Additional ADL Goal #1: Patient will stand at sink to perform grooming task as evidence of improving balance  OT Frequency: Min 2X/week    Co-evaluation              AM-PAC OT "6 Clicks" Daily Activity     Outcome Measure Help from another person eating meals?: A Little Help from another person taking care of personal grooming?: A Little Help from another person toileting, which includes using toliet, bedpan, or urinal?: A Lot Help from another person bathing (including washing, rinsing, drying)?: A Lot Help from another person to put on and taking off regular upper body clothing?: A Lot Help from another person to put on and taking off regular lower body clothing?: A Lot 6 Click Score: 14   End of Session Equipment Utilized During Treatment: Gait belt;Standard Publishing rights manager Communication: Mobility status (from OT standpoint patient is not ready to discharge home safely.)  Activity Tolerance: Patient tolerated treatment well Patient left: in chair;with chair alarm set;with call bell/phone within reach;with family/visitor present  OT Visit Diagnosis: Unsteadiness on feet (R26.81);Muscle weakness (generalized) (M62.81);Cognitive communication deficit (R41.841);Other symptoms and signs involving cognitive function                Time: [id number] OT Time Calculation (min): 33 min Charges:  OT General Charges $OT Visit: 1 Visit OT Evaluation $OT Eval Low Complexity: 1  Low OT Treatments $Therapeutic Activity: 8-22 mins  Charlann Lange, OTS Acute rehab services   Charlann Lange 12/27/2021, 1:00 PM

## 2021-12-27 NOTE — Evaluation (Addendum)
Physical Therapy Evaluation Patient Details Name: [name] Juarez MRN: [id number] DOB: 28-Feb-1946 Today's Date: 12/27/2021  History of Present Illness  Patient is 76 year old male admitted for acute renal failure. 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 He had significant difficulty with planning and coordinating functional mobility. He exhibits forward head and trunk posture. He repeatedly demonstrated "freezing" when attempting to perform stand pivot and or take steps. Used RW for safety but he also had difficulty with this due to not having to use a walker at baseline (pt is not familiar with equipment). Once sitting back on edge of bed, had pt perform 2 sit to stand transitions without RW-he was able but still requires assistance to do so. Wife was present during session. Discussed d/c plan-she is agreeable to placement for continued rehab. Will request an AIR consult to see if he is a potential candidate. If he is not, will need ST SNF.      Recommendations for follow up therapy are one component of a multi-disciplinary discharge planning process, led by the attending physician.  Recommendations may be updated based on patient status, additional functional criteria and insurance authorization.  Follow Up Recommendations Acute inpatient rehab (3hours/day)      Assistance Recommended at Discharge Frequent or constant Supervision/Assistance  Patient can return home with the following  A lot of help with walking and/or transfers;A lot of help with bathing/dressing/bathroom;Assistance with cooking/housework;Direct supervision/assist for medications management;Assist for transportation;Help with stairs or ramp for entrance;Direct supervision/assist for financial management    Equipment  Recommendations  (continuing to assess-may need RW)  Recommendations for Other Services  Rehab consult;OT consult    Functional Status Assessment Patient has had a recent decline in their functional status and demonstrates the ability to make significant improvements in function in a reasonable and predictable amount of time.     Precautions / Restrictions Precautions Precautions: Fall;Airborne (shingles) Restrictions Weight Bearing Restrictions: No      Mobility  Bed Mobility  Sit to Supine: Mod Assist             General bed mobility comments: oob in recliner. Assist for trunk and LEs for back to bed. Increased time.     Transfers Overall transfer level: Needs assistance Equipment used: Rolling walker (2 wheels) Transfers: Sit to/from Stand, Bed to chair/wheelchair/BSC Sit to Stand: Mod assist, +2 safety/equipment   Step pivot transfers: +2 safety/equipment, Mod assist       General transfer comment: Significant difficulty with advancing LEs-freezing. Assist to weight shift, advance LEs, walker management.    Ambulation/Gait Ambulation/Gait assistance: Mod assist, +2 safety/equipment   Assistive device: Rolling walker (2 wheels) Gait Pattern/deviations: Step-to pattern, Trunk flexed       General Gait Details: Significant difficulty with advancing LEs-freezing with difficulty planning and coordinating movement. Assist to weight shift, advance LEs, walker management. Pt took a step or two forwards and a step or two backwards.  Stairs            Wheelchair Mobility    Modified Rankin (Stroke Patients Only)       Balance Overall balance assessment: Needs assistance         Standing balance support: During functional activity, Reliant on assistive device for balance, Bilateral upper extremity supported Standing balance-Leahy Scale: Poor  Pertinent Vitals/Pain Pain Assessment Pain Assessment: No/denies  pain    Home Living Family/patient expects to be discharged to:: Unsure Living Arrangements: Spouse/significant other Available Help at Discharge: Family Type of Home: House Home Access: Stairs to enter Entrance Stairs-Rails: None Entrance Stairs-Number of Steps: 2   Home Layout: One level Home Equipment: Conservation officer, nature (2 wheels);Cane - quad;Shower seat - built in;Toilet riser      Prior Function Prior Level of Function : Independent/Modified Independent             Mobility Comments: ambulatory with shuffle gait without device/assistance       Hand Dominance   Dominant Hand: Right    Extremity/Trunk Assessment   Upper Extremity Assessment Upper Extremity Assessment: Defer to OT evaluation    Lower Extremity Assessment Lower Extremity Assessment: Generalized weakness    Cervical / Trunk Assessment Cervical / Trunk Assessment: Kyphotic  Communication   Communication: No difficulties  Cognition Arousal/Alertness: Awake/alert Behavior During Therapy: WFL for tasks assessed/performed Overall Cognitive Status: History of cognitive impairments - at baseline                                          General Comments      Exercises     Assessment/Plan    PT Assessment Patient needs continued PT services  PT Problem List Decreased strength;Decreased mobility;Decreased activity tolerance;Decreased balance;Decreased knowledge of use of DME;Decreased cognition;Decreased coordination       PT Treatment Interventions DME instruction;Gait training;Therapeutic activities;Therapeutic exercise;Patient/family education;Balance training;Functional mobility training;Neuromuscular re-education    PT Goals (Current goals can be found in the Care Plan section)  Acute Rehab PT Goals Patient Stated Goal: to regain PLOF/independence PT Goal Formulation: With patient/family Time For Goal Achievement: 01/10/22 Potential to Achieve Goals: Good    Frequency  Min 3X/week     Co-evaluation               AM-PAC PT "6 Clicks" Mobility  Outcome Measure Help needed turning from your back to your side while in a flat bed without using bedrails?: A Lot Help needed moving from lying on your back to sitting on the side of a flat bed without using bedrails?: A Lot Help needed moving to and from a bed to a chair (including a wheelchair)?: A Lot Help needed standing up from a chair using your arms (e.g., wheelchair or bedside chair)?: A Lot Help needed to walk in hospital room?: A Lot Help needed climbing 3-5 steps with a railing? : Total 6 Click Score: 11    End of Session Equipment Utilized During Treatment: Gait belt Activity Tolerance: Patient tolerated treatment well Patient left: in bed;with call bell/phone within reach;with family/visitor present;with bed alarm set   PT Visit Diagnosis: Muscle weakness (generalized) (M62.81);Difficulty in walking, not elsewhere classified (R26.2);Other symptoms and signs involving the nervous system [id number])    Time: [id number] PT Time Calculation (min) (ACUTE ONLY): 15 min   Charges:   PT Evaluation $PT Eval Moderate Complexity: 1 Mod          Doreatha Massed, PT Acute Rehabilitation  Office: [phone] Pager: [phone]

## 2021-12-27 NOTE — Progress Notes (Signed)
PROGRESS NOTE   [name] Juarez  GHW:[id number]    DOB: 1945-09-02    DOA: 12/26/2021  PCP: Shirline Frees, MD   I have briefly reviewed patients previous medical records in [facility].  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  As per patient's daughter who is an Scientist, physiological for patient's PCP, most recent creatinine 12/08/2021 was 1.6 and she confirms that this is his baseline.  S/p IV fluid bolus x2 L in ED followed by maintenance IV fluids.  Creatinine back to his baseline/1.65.  Follow BMP in AM.  Discontinued IV fluids.   Dehydration: As evidenced by disproportionate elevation of BUN compared to creatinine.  Added IV fluids.  Dehydration resolved.   Generalized weakness/physical deconditioning: Secondary to shingles complicating underlying moderate dementia with some parkinsonian features.  Treat underlying etiology as above.  PT and OT input appreciated and recommend CIR-consulted.  Not sure if he will qualify for CIR due to being in observation status.  Does not have medical intensity to change to inpatient.   Herpes zoster/shingles: Of L1 or L2 dermatome.  No pain or pruritus.   Continue Valtrex.  Airborne/contact isolation.  Lesion starting to dry up.   Essential hypertension: Soft blood pressures.  Not on antihypertensives at home.   Hyperlipidemia: Not on meds PTA.   Moderate dementia with parkinsonian features: Delirium precautions.  Patients daughter stayed with him overnight .  Resume current continue prior home dose of Namenda and Sinemet.  Outpatient follow-up with neurology. Although wife is not certain if the Sinemet is helping or not.  Diarrhea: Unclear etiology.  Not sure if he was constipated PTA and that is resolving now.  Monitor.  Low index of suspicion for C. difficile at this time.  If recurs, trial of Imodium.  Chronic anemia: Stable.  Slight drop in hemoglobin from admission is likely related to hemodilution.  May be related to his chronic kidney disease or anemia of chronic disease.  Body mass index is 25.24 kg/m.    DVT prophylaxis: enoxaparin (LOVENOX) injection 40 mg Start: 12/26/21 2200     Code Status: Full Code:  Family Communication: Patient's daughter and spouse at bedside. Disposition:  Status is: Observation The patient remains OBS appropriate and will d/c before 2 midnights.  No medical intensity noted to recommend inpatient change.     Consultants:     Procedures:     Antimicrobials:   Valacyclovir 9/23 >   Subjective:  Patient is a poor historian secondary to dementia.  As per his daughter at bedside, slept well last night.  Had 3 episodes of diarrhea this morning but none since.  No other complaints reported.  Objective:   Vitals:   12/26/21 1907 12/26/21 2214 12/27/21 0358 12/27/21 0920  BP:  Marland Kitchen)  95/45 (!) 105/55 (!) 106/49  Pulse:  64 66 68  Resp:  16 14 16   Temp:  98.4 F (36.9 C) 99.6 F (37.6 C) 100.3 F (37.9 C)  TempSrc:  Oral Oral Oral  SpO2:  92% 90% 92%  Weight: 73.1 kg     Height: 5\' 7"  (1.702 m)       General exam: Elderly male, moderately built and nourished lying comfortably propped up  in bed without distress.  Oral mucosa moist. Respiratory system: Clear to auscultation. Respiratory effort normal. Cardiovascular system: S1 & S2 heard, RRR. No JVD, murmurs, rubs, gallops or clicks. No pedal edema. Gastrointestinal system: Abdomen is nondistended, soft and nontender. No organomegaly or masses felt. Normal bowel sounds heard. Central nervous system: Alert and oriented. No focal neurological deficits. Extremities: Symmetric 5 x 5 power. Skin: Vesicular rash with some erythema across the left groin and medial thigh, extending up to the left anterior hip.  Likely L1 or L2 dermatome.  Consistent with shingles.  Even compared to yesterday, lesions are starting to dry up and no new vesicles appreciated. Psychiatry: Judgement and insight impaired. Mood & affect appropriate.     Data Reviewed:   I have personally reviewed following labs and imaging studies   CBC: Recent Labs  Lab 12/26/21 1317 12/27/21 0633  WBC 7.5 8.4  NEUTROABS 5.6  --   HGB 11.8* 10.4*  HCT 37.8* 32.7*  MCV 95.7 95.1  PLT 183 0000000    Basic Metabolic Panel: Recent Labs  Lab 12/26/21 1317 12/27/21 0633  NA 141 139  K 4.3 3.8  CL 113* 110  CO2 22 21*  GLUCOSE 122* 135*  BUN 35* 36*  CREATININE 1.92* 1.65*  CALCIUM 9.1 8.4*    Liver Function Tests: Recent Labs  Lab 12/26/21 1317  AST 19  ALT 17  ALKPHOS 37*  BILITOT 0.9  PROT 7.4  ALBUMIN 3.8    CBG: No results for input(s): "GLUCAP" in the last 168 hours.  Microbiology Studies:  No results found for this or any previous visit (from the past 240 hour(s)).  Radiology Studies:  No results found.  Scheduled Meds:    carbidopa-levodopa  1 tablet Oral Q8H   enoxaparin (LOVENOX) injection  40 mg Subcutaneous QHS   memantine  10 mg Oral BID   valACYclovir  1,000 mg Oral Q12H    Continuous Infusions:     LOS: 0 days     Vernell Leep, MD,  FACP, FHM, SFHM, [facility], [place]   To contact the attending provider between 7A-7P or the covering provider during after hours 7P-7A, please log into the web site [email or web address] and access using universal [place] password for that web site. If you do not have the password, please call the hospital operator.  12/27/2021, 2:13 PM

## 2021-12-27 NOTE — Plan of Care (Signed)
  Problem: Education: Goal: Knowledge of General Education information will improve Description Including pain rating scale, medication(s)/side effects and non-pharmacologic comfort measures Outcome: Progressing   

## 2021-12-28 ENCOUNTER — Observation Stay (HOSPITAL_COMMUNITY): Payer: Medicare Other

## 2021-12-28 DIAGNOSIS — R531 Weakness: Secondary | ICD-10-CM | POA: Diagnosis not present

## 2021-12-28 DIAGNOSIS — R059 Cough, unspecified: Secondary | ICD-10-CM | POA: Diagnosis not present

## 2021-12-28 DIAGNOSIS — N179 Acute kidney failure, unspecified: Secondary | ICD-10-CM | POA: Diagnosis not present

## 2021-12-28 DIAGNOSIS — R1310 Dysphagia, unspecified: Secondary | ICD-10-CM

## 2021-12-28 DIAGNOSIS — N189 Chronic kidney disease, unspecified: Secondary | ICD-10-CM | POA: Diagnosis not present

## 2021-12-28 DIAGNOSIS — B029 Zoster without complications: Secondary | ICD-10-CM | POA: Diagnosis not present

## 2021-12-28 LAB — CBC
HCT: 34.1 % — ABNORMAL LOW (ref 39.0–52.0)
Hemoglobin: 10.9 g/dL — ABNORMAL LOW (ref 13.0–17.0)
MCH: 30.2 pg (ref 26.0–34.0)
MCHC: 32 g/dL (ref 30.0–36.0)
MCV: 94.5 fL (ref 80.0–100.0)
Platelets: 158 10*3/uL (ref 150–400)
RBC: 3.61 MIL/uL — ABNORMAL LOW (ref 4.22–5.81)
RDW: 13.2 % (ref 11.5–15.5)
WBC: 10.6 10*3/uL — ABNORMAL HIGH (ref 4.0–10.5)
nRBC: 0 % (ref 0.0–0.2)

## 2021-12-28 LAB — BASIC METABOLIC PANEL
Anion gap: 8 (ref 5–15)
BUN: 40 mg/dL — ABNORMAL HIGH (ref 8–23)
CO2: 20 mmol/L — ABNORMAL LOW (ref 22–32)
Calcium: 8.3 mg/dL — ABNORMAL LOW (ref 8.9–10.3)
Chloride: 110 mmol/L (ref 98–111)
Creatinine, Ser: 1.75 mg/dL — ABNORMAL HIGH (ref 0.61–1.24)
GFR, Estimated: 40 mL/min — ABNORMAL LOW (ref >60)
Glucose, Bld: 158 mg/dL — ABNORMAL HIGH (ref 70–99)
Potassium: 3.8 mmol/L (ref 3.5–5.1)
Sodium: 138 mmol/L (ref 135–145)

## 2021-12-28 NOTE — Evaluation (Signed)
Clinical/Bedside Swallow Evaluation Patient Details  Name: [name] Juarez MRN: [id number] Date of Birth: 10-13-1945  Today's Date: 12/28/2021 Time: SLP Start Time (ACUTE ONLY): 1055 SLP Stop Time (ACUTE ONLY): 1115 SLP Time Calculation (min) (ACUTE ONLY): 20 min  Past Medical History:  Past Medical History:  Diagnosis Date   Acute appendicitis with peritonitis, perforation s/p lap appy 08/03/2013 08/03/2013   Chronic kidney disease (CKD) stage G3a/A1, moderately decreased glomerular filtration rate (GFR) between 45-59 mL/min/1.73 square meter and albuminuria creatinine ratio less than 30 mg/g (HCC)    ckd stage 3 b   Colon polyp 10/2006   Colovesical fistula 11/14/2019   Dementia ([place])    early stage   Erectile dysfunction    History of hypertension    off all meds x 2 years as of 05-13-2020   Hyperlipidemia    Inguinal hernia    Left   Memory loss    Peripheral vascular disease ([place])    Pre-diabetes    Tuberculosis    age 47 tx done   Varicose veins    both legs   Wears partial dentures upper and lower   Past Surgical History:  Past Surgical History:  Procedure Laterality Date   ABDOMINAL AORTIC ENDOVASCULAR STENT GRAFT  11/03/2011   right leg   COLONOSCOPY  2021   CYSTOSCOPY W/ URETERAL STENT PLACEMENT N/A 01/31/2020   Procedure: FIREFLY INJECTION;  Surgeon: Janith Lima, MD;  Location: [facility];  Service: Urology;  Laterality: N/A;   evar  2013   INGUINAL HERNIA REPAIR Right 2000   open RIH repair   INGUINAL HERNIA REPAIR Left 05/15/2020   Procedure: LAPAROSCOPIC LEFT  INGUINAL HERNIA REPAIR with mesh ;  Surgeon: Michael Boston, MD;  Location: [facility];  Service: General;  Laterality: Left;   KNEE SURGERY Right    LAPAROSCOPIC APPENDECTOMY N/A 08/03/2013   Procedure: APPENDECTOMY LAPAROSCOPIC;  Surgeon: Earnstine Regal, MD;  Location: [facility];  Service: General;  Laterality: N/A;   LAPAROSCOPIC LOW ANTERIOR RESECTION  10/ 28/2021   HPI:  Patient is a 76 y.o.  male with PMH: moderate dementia with associated possible Parkinsonian features, HTN, HLD, CKD stage IIIb, PAD, pre-DM, COPD, right frontal CVA who presented to teh ED on 12/26/21 with c/o generalized weakness and ambulatory dysfunction (inability to walk) following recent diagnosis of shingles. RN contacted SLP regarding order for swallow evaluation, reporting that patient was not able to drink through straw, kept mouth open when liquids via cup presented, but able to sip, having some coughing and difficulty taking pills.    Assessment / Plan / Recommendation  Clinical Impression  Patient presents with clinical s/s of dysphagia as per this bedside/clinical swallow evaluation. His wife who was present reported that patient does not have h/o swallowing difficulties and she feels that the recent diagnosis of shingles exacerbated his dementia. She described a similar thing happening when patient had Covid and that it resulted in him not being able to walk just as what happened this current hospitalization. SLP assessed patient's toleration of thin liquids, puree solids, regular solids. With puree solids, he exhibited delay in anterior to posterior transit and laryngeal pumping. His mastication and oral transit of saltine cracker was actually more timely than with puree solids. With thin liquids (water), he was able to drink via cup sips and straw sips, exhibiting delays in oral and suspected swallow initiation delays. He did exhibit one instance of cough response after first sip of water, with cough sounding  dry and congested. As patient does not have h/o dysphagia and today's CXR showed lungs to be clear, SLP recommending slight downgrade of solids to Dys 3 (mechanical soft) and continue with thin liquids. SLP will f/u at least one more time to ensure toleration. SLP Visit Diagnosis: Dysphagia, unspecified (R13.10)    Aspiration Risk  Mild aspiration risk    Diet Recommendation Dysphagia 3 (Mech soft);Thin  liquid   Liquid Administration via: Cup;Straw Medication Administration: Whole meds with puree Supervision: Patient able to self feed;Full supervision/cueing for compensatory strategies;Staff to assist with self feeding Compensations: Slow rate;Small sips/bites;Minimize environmental distractions Postural Changes: Seated upright at 90 degrees    Other  Recommendations Oral Care Recommendations: Oral care BID    Recommendations for follow up therapy are one component of a multi-disciplinary discharge planning process, led by the attending physician.  Recommendations may be updated based on patient status, additional functional criteria and insurance authorization.  Follow up Recommendations Follow physician's recommendations for discharge plan and follow up therapies      Assistance Recommended at Discharge Frequent or constant Supervision/Assistance  Functional Status Assessment Patient has had a recent decline in their functional status and demonstrates the ability to make significant improvements in function in a reasonable and predictable amount of time.  Frequency and Duration min 1 x/week  1 week       Prognosis Prognosis for Safe Diet Advancement: Good      Swallow Study   General Date of Onset: 12/28/21 HPI: Patient is a 76 y.o. male with PMH: moderate dementia with associated possible Parkinsonian features, HTN, HLD, CKD stage IIIb, PAD, pre-DM, COPD, right frontal CVA who presented to teh ED on 12/26/21 with c/o generalized weakness and ambulatory dysfunction (inability to walk) following recent diagnosis of shingles. RN contacted SLP regarding order for swallow evaluation, reporting that patient was not able to drink through straw, kept mouth open when liquids via cup presented, but able to sip, having some coughing and difficulty taking pills. Type of Study: Bedside Swallow Evaluation Previous Swallow Assessment: none found Diet Prior to this Study: Regular;Thin  liquids Temperature Spikes Noted: No Respiratory Status: Room air History of Recent Intubation: No Behavior/Cognition: Alert;Cooperative Oral Cavity Assessment: Dry Oral Care Completed by SLP: Yes Oral Cavity - Dentition: Adequate natural dentition Vision: Functional for self-feeding Self-Feeding Abilities: Needs assist;Needs set up;Able to feed self Patient Positioning: Upright in bed Baseline Vocal Quality: Low vocal intensity Volitional Cough: Strong Volitional Swallow: Able to elicit    Oral/Motor/Sensory Function Overall Oral Motor/Sensory Function: Within functional limits   Ice Chips     Thin Liquid Thin Liquid: Impaired Presentation: Cup;Straw Oral Phase Functional Implications: Prolonged oral transit Pharyngeal  Phase Impairments: Suspected delayed Swallow;Cough - Delayed Other Comments: one delayed cough which sounded dry/congested    Nectar Thick     Honey Thick     Puree Puree: Impaired Presentation: Spoon Oral Phase Impairments: Reduced lingual movement/coordination Oral Phase Functional Implications: Prolonged oral transit   Solid     Solid: Impaired Oral Phase Impairments: Impaired mastication      Sonia Baller, MA, CCC-SLP Speech Therapy

## 2021-12-28 NOTE — Progress Notes (Signed)
PROGRESS NOTE   [name] Juarez  [id number]    DOB: Nov 16, 1945    DOA: 12/26/2021  PCP: Shirline Frees, MD   I have briefly reviewed patients previous medical records in [facility].  Chief Complaint  Patient presents with   Weakness   Herpes Zoster    Brief Narrative:  76 year old married male, usually independent and ambulates with a shuffling gait and at times uses a cane, medical history significant for moderate dementia with associated possible parkinsonian features, hypertension, hyperlipidemia, CKD stage IIIb, PAD, pre-DM, COPD, right frontal CVA presented to the ED on 12/26/2021 with complaints of generalized weakness and ambulatory dysfunction (inability to walk) following recent diagnosis of shingles   Assessment & Plan:  Principal Problem:   Acute kidney injury superimposed on chronic kidney disease ([place]) Active Problems:   Hypertension   Shingles     Acute kidney injury complicating stage IIIa CKD secondary to dehydration in the context of recently diagnosed shingles: Last known creatinine 1.37 on 02/03/2020.  As per patient's daughter who is an Scientist, physiological for patient's PCP, most recent creatinine 12/08/2021 was 1.6 and she confirms that this is his baseline.  S/p IV fluid bolus x2 L in ED followed by maintenance IV fluids.  Creatinine back to his baseline/1.65.  Discontinued IV fluids yesterday.  Creatinine slightly higher, 1.65 > 1.75.  Not on diuretics.  Follow BMP in AM.   Dehydration: As evidenced by disproportionate elevation of BUN compared to creatinine.  Added IV fluids.  Dehydration resolved.   Generalized weakness/physical deconditioning: Secondary to shingles complicating underlying moderate dementia with some parkinsonian features.  Treat underlying etiology as above.  PT and OT input appreciated and recommend CIR-consulted.  As per CIR screening, would not be appropriate for CIR since in observation status.  TOC on board for possible SNF/THN.  Currently  remains without medical intensity to recommend inpatient change.   Herpes zoster/shingles: Of L1 or L2 dermatome.  No pain or pruritus.  Continue Valtrex.  Airborne/contact isolation.  Lesion starting to dry up, improving.   Essential hypertension: Soft blood pressures.  Not on antihypertensives at home.   Hyperlipidemia: Not on meds PTA.   Moderate dementia with parkinsonian features: Delirium precautions.  Patients daughter stayed with him overnight .  Resume current continue prior home dose of Namenda and Sinemet.  Outpatient follow-up with neurology. Although wife is not certain if the Sinemet is helping or not.  Diarrhea: Unclear etiology.  Not sure if he was constipated PTA and that is resolving now.  Resolved after yesterday morning's episode and no BM since.  Chronic anemia: Stable.  May be related to his chronic kidney disease or anemia of chronic disease.  Dysphagia: As per nursing, coughing with pill intake.  Speech therapy consulted.  Chest x-ray personally reviewed and reported as no active disease.  SLP consulted and input pending.  Body mass index is 25.24 kg/m.    DVT prophylaxis: enoxaparin (LOVENOX) injection 40 mg Start: 12/26/21 2200     Code Status: Full Code:  Family Communication: Patient's spouse at bedside. D/W daughter, [name] [phone]). Disposition:  Status is: Observation The patient remains OBS appropriate and will d/c before 2 midnights.  No medical intensity noted to recommend inpatient change.  Should be stable for DC pending SLP evaluation.  CIR declined.  TOC to look for SNF under [facility] waiver.     Consultants:     Procedures:     Antimicrobials:   Valacyclovir 9/23 >   Subjective:  Patient is a poor historian secondary to dementia.  As per spouse at bedside, no BM since yesterday morning.  Tolerated some morphine and other breakfast this morning.  As per nursing, was coughing with meds and had a congested cough.  No dyspnea.   Spouse also reported that he is having some problems with hand mouth coordination.  Objective:   Vitals:   12/27/21 0920 12/27/21 1420 12/27/21 2007 12/28/21 0634  BP: (!) 106/49 (!) 103/56 (!) 110/56 99/60  Pulse: 68 68 73 98  Resp: 16 16 16 20   Temp: 100.3 F (37.9 C) (!) 97.1 F (36.2 C) 99.6 F (37.6 C) 98 F (36.7 C)  TempSrc: Oral Oral Oral Oral  SpO2: 92% 94% 92% 93%  Weight:      Height:        General exam: Elderly male, moderately built and nourished lying Tublu propped up in bed without distress.  Oral mucosa moist. Respiratory system: Clear to auscultation without wheezing, rhonchi or crackles.  No increased work of breathing. Cardiovascular system: S1 & S2 heard, RRR. No JVD, murmurs, rubs, gallops or clicks. No pedal edema. Gastrointestinal system: Abdomen is nondistended, soft and nontender. No organomegaly or masses felt. Normal bowel sounds heard. Central nervous system: Alert and oriented only to self. No focal neurological deficits. Extremities: Symmetric 5 x 5 power. Skin: Vesicular rash with some erythema across the left groin and medial thigh, extending up to the left anterior hip.  Likely L1 or L2 dermatome.  Consistent with shingles.  Lesion and starting to dry up and scab.  No active or new lesions. Psychiatry: Judgement and insight impaired. Mood & affect appropriate.     Data Reviewed:   I have personally reviewed following labs and imaging studies   CBC: Recent Labs  Lab 12/26/21 1317 12/27/21 0633 12/28/21 0331  WBC 7.5 8.4 10.6*  NEUTROABS 5.6  --   --   HGB 11.8* 10.4* 10.9*  HCT 37.8* 32.7* 34.1*  MCV 95.7 95.1 94.5  PLT 183 153 0000000    Basic Metabolic Panel: Recent Labs  Lab 12/26/21 1317 12/27/21 0633 12/28/21 0331  NA 141 139 138  K 4.3 3.8 3.8  CL 113* 110 110  CO2 22 21* 20*  GLUCOSE 122* 135* 158*  BUN 35* 36* 40*  CREATININE 1.92* 1.65* 1.75*  CALCIUM 9.1 8.4* 8.3*    Liver Function Tests: Recent Labs  Lab  12/26/21 1317  AST 19  ALT 17  ALKPHOS 37*  BILITOT 0.9  PROT 7.4  ALBUMIN 3.8    CBG: No results for input(s): "GLUCAP" in the last 168 hours.  Microbiology Studies:  No results found for this or any previous visit (from the past 240 hour(s)).  Radiology Studies:  DG CHEST PORT 1 VIEW  Result Date: 12/28/2021 CLINICAL DATA:  Cough. EXAM: PORTABLE CHEST 1 VIEW COMPARISON:  November 14, 2019. FINDINGS: The heart size and mediastinal contours are within normal limits. Both lungs are clear. The visualized skeletal structures are unremarkable. IMPRESSION: No active disease. Electronically Signed   By: Marijo Conception M.D.   On: 12/28/2021 10:39    Scheduled Meds:    carbidopa-levodopa  1 tablet Oral Q8H   enoxaparin (LOVENOX) injection  40 mg Subcutaneous QHS   memantine  10 mg Oral BID   valACYclovir  1,000 mg Oral Q12H    Continuous Infusions:     LOS: 0 days     Vernell Leep, MD,  FACP, [place], [place], [place], CHCQM-PHYADV  [facility]   To contact the attending provider between 7A-7P or the covering provider during after hours 7P-7A, please log into the web site [email or web address] and access using universal [place] password for that web site. If you do not have the password, please call the hospital operator.  12/28/2021, 10:51 AM

## 2021-12-28 NOTE — TOC Initial Note (Signed)
Transition of Care [facility]) - Initial/Assessment Note    Patient Details  Name: [name] Juarez MRN: [id number] Date of Birth: 1945-08-14  Transition of [facility]) CM/SW Contact:    Lennart Pall, LCSW Phone Number: 12/28/2021, 2:39 PM  Clinical Narrative:                 Pt currently not oriented fully and have spoken with his daughter regarding dc planning needs.  Daughter notes that pt has had significant decline in overall functioning since he contracted shingles.  Notes his baseline was a shuffling gait, however, he was mod ind overall with mobility and ADLs.  She is in agreement with MD and therapy at this point in recommending SNF rehab as her mother cannot meet current support needs.  Will begin bed search but anticipate will not be able to move pt until the lesions are crusted - will discuss further with nursing.  Expected Discharge Plan: Skilled Nursing Facility Barriers to Discharge: Continued Medical Work up, Ship broker, SNF Pending bed offer   Patient Goals and CMS Choice Patient states their goals for this hospitalization and ongoing recovery are:: daughter hopeful he may improve enough to dc home      Expected Discharge Plan and Services Expected Discharge Plan: [place] In-house Referral: Clinical Social Work     Living arrangements for the past 2 months: Single Family Home                 DME Arranged: N/A DME Agency: NA                  Prior Living Arrangements/Services Living arrangements for the past 2 months: Single Family Home Lives with:: Spouse Patient language and need for interpreter reviewed:: Yes Do you feel safe going back to the place where you live?: Yes      Need for Family Participation in Patient Care: Yes (Comment) Care giver support system in place?: Yes (comment)   Criminal Activity/Legal Involvement Pertinent to Current Situation/Hospitalization: No - Comment as needed  Activities of Daily Living Home  Assistive Devices/Equipment: None ADL Screening (condition at time of admission) Patient's cognitive ability adequate to safely complete daily activities?: Yes Is the patient deaf or have difficulty hearing?: No Does the patient have difficulty seeing, even when wearing glasses/contacts?: No Does the patient have difficulty concentrating, remembering, or making decisions?: No Patient able to express need for assistance with ADLs?: Yes Does the patient have difficulty dressing or bathing?: Yes Independently performs ADLs?: Yes (appropriate for developmental age) Communication: Needs assistance Is this a change from baseline?: Change from baseline, expected to last >3 days Dressing (OT): Needs assistance Is this a change from baseline?: Change from baseline, expected to last >3 days Grooming: Needs assistance Is this a change from baseline?: Change from baseline, expected to last >3 days Feeding: Needs assistance Is this a change from baseline?: Change from baseline, expected to last >3 days Bathing: Needs assistance Is this a change from baseline?: Change from baseline, expected to last >3 days Toileting: Needs assistance Is this a change from baseline?: Change from baseline, expected to last >3days In/Out Bed: Needs assistance Is this a change from baseline?: Change from baseline, expected to last >3 days Walks in Home: Needs assistance Is this a change from baseline?: Change from baseline, expected to last >3 days Does the patient have difficulty walking or climbing stairs?: Yes Weakness of Legs: Both Weakness of Arms/Hands: Both  Permission Sought/Granted Permission sought to share  information with : Family Supports Permission granted to share information with : Yes, Verbal Permission Granted  Share Information with NAME: Darryl Lent     Permission granted to share info w Relationship: daughter  Permission granted to share info w Contact Information: [phone]  Emotional  Assessment Appearance:: Appears stated age Attitude/Demeanor/Rapport: Unable to Assess Affect (typically observed): Unable to Assess Orientation: : Oriented to Self Alcohol / Substance Use: Not Applicable Psych Involvement: No (comment)  Admission diagnosis:  Generalized weakness [R53.1] AKI (acute kidney injury) (HCC) [N17.9] Herpes zoster without complication [B02.9] Acute kidney injury superimposed on chronic kidney disease (HCC) [N17.9, N18.9] Patient Active Problem List   Diagnosis Date Noted   Acute kidney injury superimposed on chronic kidney disease (HCC) 12/26/2021   Shingles 12/26/2021   Left inguinal hernia s/p lap repair with mesh 05/15/2020 04/09/2020   Acute renal failure - resolving 08/08/2013   Hypertension    Abdominal aneurysm without mention of rupture 09/28/2011   Varicose veins of lower extremities with other complications 04/27/2011   PCP:  Johny Blamer, MD Pharmacy:   [facility] [id number] [place], [place] - [address] [place] [address] Phone: [phone] Fax: [phone]     Social Determinants of Health (SDOH) Interventions    Readmission Risk Interventions     No data to display

## 2021-12-28 NOTE — NC FL2 (Signed)
[place] MEDICAID FL2 LEVEL OF CARE SCREENING TOOL     IDENTIFICATION  Patient Name: [name] Juarez Birthdate: December 23, 1945 Sex: male Admission Date (Current Location): 12/26/2021  [facility] and [place] Number:  Producer, television/film/video and Address:  [facility],  [address] [place], [place] [address]      Provider Number: [id number]  Attending Physician Name and Address:  Elease Etienne, MD  Relative Name and Phone Number:  daughter, Darryl Lent [phone]    Current Level of Care: Hospital Recommended Level of Care: Skilled Nursing Facility Prior Approval Number:    Date Approved/Denied:   PASRR Number: [id number]  Discharge Plan: SNF    Current Diagnoses: Patient Active Problem List   Diagnosis Date Noted   Acute kidney injury superimposed on chronic kidney disease (HCC) 12/26/2021   Shingles 12/26/2021   Left inguinal hernia s/p lap repair with mesh 05/15/2020 04/09/2020   Acute renal failure - resolving 08/08/2013   Hypertension    Abdominal aneurysm without mention of rupture 09/28/2011   Varicose veins of lower extremities with other complications 04/27/2011    Orientation RESPIRATION BLADDER Height & Weight     Self    Incontinent Weight: 161 lb 2.5 oz (73.1 kg) Height:  5\' 7"  (170.2 cm)  BEHAVIORAL SYMPTOMS/MOOD NEUROLOGICAL BOWEL NUTRITION STATUS      Incontinent    AMBULATORY STATUS COMMUNICATION OF NEEDS Skin   Extensive Assist Verbally Other (Comment) (shingles lesions; drying)                       Personal Care Assistance Level of Assistance  Bathing, Dressing Bathing Assistance: Limited assistance   Dressing Assistance: Limited assistance     Functional Limitations Info             SPECIAL CARE FACTORS FREQUENCY  PT (By licensed PT), OT (By licensed OT)     PT Frequency: 5x/wk OT Frequency: 5x/wk            Contractures Contractures Info: Not present    Additional Factors Info  Code Status, Allergies,  Psychotropic, Isolation Precautions Code Status Info: Full Allergies Info: NKDA Psychotropic Info: see MAR   Isolation Precautions Info: shingles; lesions partially drying and crusting at this point     Current Medications (12/28/2021):  This is the current hospital active medication list Current Facility-Administered Medications  Medication Dose Route Frequency Provider Last Rate Last Admin   acetaminophen (TYLENOL) tablet 650 mg  650 mg Oral Q6H PRN Hongalgi, 12/30/2021, MD       Or   acetaminophen (TYLENOL) suppository 650 mg  650 mg Rectal Q6H PRN Hongalgi, Anand D, MD       albuterol (PROVENTIL) (2.5 MG/3ML) 0.083% nebulizer solution 2.5 mg  2.5 mg Nebulization Q2H PRN Hongalgi, Maximino Greenland, MD       carbidopa-levodopa (SINEMET IR) 25-100 MG per tablet immediate release 1 tablet  1 tablet Oral Q8H Hongalgi, Maximino Greenland, MD   1 tablet at 12/28/21 0521   enoxaparin (LOVENOX) injection 40 mg  40 mg Subcutaneous QHS 12/30/21 D, MD   40 mg at 12/27/21 2118   loperamide (IMODIUM) capsule 2 mg  2 mg Oral PRN 2119, MD   2 mg at 12/27/21 2118   memantine (NAMENDA) tablet 10 mg  10 mg Oral BID 2119 D, MD   10 mg at 12/28/21 0931   valACYclovir (VALTREX) tablet 1,000 mg  1,000 mg Oral Q12H Hongalgi, 12/30/21,  MD   1,000 mg at 12/28/21 3570     Discharge Medications: Please see discharge summary for a list of discharge medications.  Relevant Imaging Results:  Relevant Lab Results:   Additional Information SS# [id number]  Lennart Pall, LCSW

## 2021-12-28 NOTE — Plan of Care (Signed)
Problem: Clinical Measurements: Goal: Respiratory complications will improve Outcome: Not Progressing   Problem: Nutrition: Goal: Adequate nutrition will be maintained Outcome: Not Progressing   Problem: Skin Integrity: Goal: Risk for impaired skin integrity will decrease Outcome: Progressing   Problem: Education: Goal: Knowledge of General Education information will improve Description: Including pain rating scale, medication(s)/side effects and non-pharmacologic comfort measures Outcome: [place] V Koleen Celia, RN 12/28/21 10:36 AM

## 2021-12-29 ENCOUNTER — Inpatient Hospital Stay (HOSPITAL_COMMUNITY): Payer: Medicare Other

## 2021-12-29 DIAGNOSIS — Z8616 Personal history of COVID-19: Secondary | ICD-10-CM | POA: Diagnosis not present

## 2021-12-29 DIAGNOSIS — N1832 Chronic kidney disease, stage 3b: Secondary | ICD-10-CM | POA: Diagnosis present

## 2021-12-29 DIAGNOSIS — Z20822 Contact with and (suspected) exposure to covid-19: Secondary | ICD-10-CM | POA: Diagnosis present

## 2021-12-29 DIAGNOSIS — Z79899 Other long term (current) drug therapy: Secondary | ICD-10-CM | POA: Diagnosis not present

## 2021-12-29 DIAGNOSIS — F02B0 Dementia in other diseases classified elsewhere, moderate, without behavioral disturbance, psychotic disturbance, mood disturbance, and anxiety: Secondary | ICD-10-CM | POA: Diagnosis present

## 2021-12-29 DIAGNOSIS — R531 Weakness: Secondary | ICD-10-CM | POA: Diagnosis not present

## 2021-12-29 DIAGNOSIS — Z789 Other specified health status: Secondary | ICD-10-CM | POA: Diagnosis not present

## 2021-12-29 DIAGNOSIS — L299 Pruritus, unspecified: Secondary | ICD-10-CM | POA: Diagnosis present

## 2021-12-29 DIAGNOSIS — G259 Extrapyramidal and movement disorder, unspecified: Secondary | ICD-10-CM | POA: Diagnosis present

## 2021-12-29 DIAGNOSIS — B029 Zoster without complications: Secondary | ICD-10-CM | POA: Diagnosis present

## 2021-12-29 DIAGNOSIS — J449 Chronic obstructive pulmonary disease, unspecified: Secondary | ICD-10-CM | POA: Diagnosis present

## 2021-12-29 DIAGNOSIS — R262 Difficulty in walking, not elsewhere classified: Secondary | ICD-10-CM | POA: Diagnosis present

## 2021-12-29 DIAGNOSIS — Z831 Family history of other infectious and parasitic diseases: Secondary | ICD-10-CM | POA: Diagnosis not present

## 2021-12-29 DIAGNOSIS — I129 Hypertensive chronic kidney disease with stage 1 through stage 4 chronic kidney disease, or unspecified chronic kidney disease: Secondary | ICD-10-CM | POA: Diagnosis present

## 2021-12-29 DIAGNOSIS — R1310 Dysphagia, unspecified: Secondary | ICD-10-CM | POA: Diagnosis not present

## 2021-12-29 DIAGNOSIS — Z86010 Personal history of colonic polyps: Secondary | ICD-10-CM | POA: Diagnosis not present

## 2021-12-29 DIAGNOSIS — D631 Anemia in chronic kidney disease: Secondary | ICD-10-CM | POA: Diagnosis present

## 2021-12-29 DIAGNOSIS — I739 Peripheral vascular disease, unspecified: Secondary | ICD-10-CM | POA: Diagnosis present

## 2021-12-29 DIAGNOSIS — E785 Hyperlipidemia, unspecified: Secondary | ICD-10-CM | POA: Diagnosis present

## 2021-12-29 DIAGNOSIS — N189 Chronic kidney disease, unspecified: Secondary | ICD-10-CM | POA: Diagnosis not present

## 2021-12-29 DIAGNOSIS — Z8673 Personal history of transient ischemic attack (TIA), and cerebral infarction without residual deficits: Secondary | ICD-10-CM | POA: Diagnosis not present

## 2021-12-29 DIAGNOSIS — N179 Acute kidney failure, unspecified: Secondary | ICD-10-CM | POA: Diagnosis present

## 2021-12-29 DIAGNOSIS — Z87891 Personal history of nicotine dependence: Secondary | ICD-10-CM | POA: Diagnosis not present

## 2021-12-29 DIAGNOSIS — M2011 Hallux valgus (acquired), right foot: Secondary | ICD-10-CM | POA: Diagnosis not present

## 2021-12-29 DIAGNOSIS — G20 Parkinson's disease: Secondary | ICD-10-CM | POA: Diagnosis present

## 2021-12-29 DIAGNOSIS — E860 Dehydration: Secondary | ICD-10-CM | POA: Diagnosis present

## 2021-12-29 LAB — CBC WITH DIFFERENTIAL/PLATELET
Abs Immature Granulocytes: 0.02 10*3/uL (ref 0.00–0.07)
Basophils Absolute: 0 10*3/uL (ref 0.0–0.1)
Basophils Relative: 0 %
Eosinophils Absolute: 0 10*3/uL (ref 0.0–0.5)
Eosinophils Relative: 0 %
HCT: 39.4 % (ref 39.0–52.0)
Hemoglobin: 12.1 g/dL — ABNORMAL LOW (ref 13.0–17.0)
Immature Granulocytes: 0 %
Lymphocytes Relative: 9 %
Lymphs Abs: 0.9 10*3/uL (ref 0.7–4.0)
MCH: 30.1 pg (ref 26.0–34.0)
MCHC: 30.7 g/dL (ref 30.0–36.0)
MCV: 98 fL (ref 80.0–100.0)
Monocytes Absolute: 0.9 10*3/uL (ref 0.1–1.0)
Monocytes Relative: 9 %
Neutro Abs: 7.6 10*3/uL (ref 1.7–7.7)
Neutrophils Relative %: 82 %
Platelets: 183 10*3/uL (ref 150–400)
RBC: 4.02 MIL/uL — ABNORMAL LOW (ref 4.22–5.81)
RDW: 13.2 % (ref 11.5–15.5)
WBC: 9.3 10*3/uL (ref 4.0–10.5)
nRBC: 0 % (ref 0.0–0.2)

## 2021-12-29 LAB — RESPIRATORY PANEL BY PCR

## 2021-12-29 LAB — SARS CORONAVIRUS 2 BY RT PCR: SARS Coronavirus 2 by RT PCR: NEGATIVE

## 2021-12-29 LAB — BASIC METABOLIC PANEL
Anion gap: 7 (ref 5–15)
BUN: 42 mg/dL — ABNORMAL HIGH (ref 8–23)
CO2: 22 mmol/L (ref 22–32)
Calcium: 8.6 mg/dL — ABNORMAL LOW (ref 8.9–10.3)
Chloride: 109 mmol/L (ref 98–111)
Creatinine, Ser: 1.95 mg/dL — ABNORMAL HIGH (ref 0.61–1.24)
GFR, Estimated: 35 mL/min — ABNORMAL LOW (ref >60)
Glucose, Bld: 147 mg/dL — ABNORMAL HIGH (ref 70–99)
Potassium: 4.3 mmol/L (ref 3.5–5.1)
Sodium: 138 mmol/L (ref 135–145)

## 2021-12-29 MED ORDER — LACTATED RINGERS IV SOLN: INTRAVENOUS | Status: DC

## 2021-12-29 MED ORDER — POLYETHYLENE GLYCOL 3350 17 G PO PACK: 17.0000 g | PACK | Freq: Every day | ORAL | Status: DC | PRN

## 2021-12-29 NOTE — Progress Notes (Signed)
Inpatient Rehab Coordinator Note:  I spoke with daughter and spouse by phone to discuss CIR recommendations and goals/expectations of CIR stay.  We reviewed 3 hrs/day of therapy, physician follow up, and average length of stay 2 weeks (dependent upon progress) with goals of mod I/Min A.  Family report that they can provide 24/7 assist upon discharge. They would like to pursue CIR. Information sent to insurance for prior authorization. Will continue to follow for potential admit to CIR if insurance approves/bed available.     Rehab Admissons Coordinator [place], [place], [place] [phone]

## 2021-12-29 NOTE — Progress Notes (Signed)
Occupational Therapy Treatment Patient Details Name: [name] Juarez MRN: [id number] DOB: 16-May-1945 Today's Date: 12/29/2021   History of present illness Patient is 76 year old male admitted for acute renal failure. 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 Nurse made aware with nurse reporting having seen this earlier this AM. Patient was able to transfer with +2 hand held assistance from edge of bed to [facility] in room. Patient required increased sequencing and safety cues for all tasks. Patient would continue to benefit from skilled OT services at this time while admitted and after d/c to address noted deficits in order to improve overall safety and independence in ADLs.     Recommendations for follow up therapy are one component of a multi-disciplinary discharge planning process, led by the attending physician.  Recommendations may be updated based on patient status, additional functional criteria and insurance authorization.    Follow Up Recommendations  Acute inpatient rehab (3hours/day)    Assistance Recommended at Discharge Frequent or constant Supervision/Assistance  Patient can return home with the following  A lot of help with bathing/dressing/bathroom;Assistance with feeding;Assist for transportation;Direct supervision/assist for financial management;Assistance with cooking/housework;Help with stairs or ramp for entrance;Direct supervision/assist for medications management;Two people to help with walking and/or transfers   Equipment Recommendations  None recommended by OT    Recommendations for Other Services      Precautions / Restrictions Precautions Precautions: Fall Restrictions Weight Bearing Restrictions: No       Mobility Bed Mobility Overal bed mobility: Needs Assistance Bed  Mobility: Supine to Sit     Supine to sit: HOB elevated, Mod assist     General bed mobility comments: with increased time and cues. noted to have increased discomfort with touch of R foot nurse aware    Transfers                         Balance                                           ADL either performed or assessed with clinical judgement   ADL Overall ADL's : Needs assistance/impaired                         Toilet Transfer: BSC/3in1;Rolling walker (2 wheels);Moderate assistance;Cueing for sequencing;+2 for physical assistance Toilet Transfer Details (indicate cue type and reason): hand held assistance noted leaning away from R foot. Toileting- Clothing Manipulation and Hygiene: Total assistance;Sit to/from stand Toileting - Clothing Manipulation Details (indicate cue type and reason): hygiene tasks with foward flexion posture noted. patient hand held assistance with standing.     Functional mobility during ADLs: +2 for safety/equipment;Moderate assistance General ADL Comments: to take steps in room over to [facility]    Extremity/Trunk Assessment              Vision       Perception     Praxis      Cognition Arousal/Alertness: Awake/alert Behavior During Therapy: WFL for tasks assessed/performed Overall Cognitive Status: History of cognitive impairments - at baseline  General Comments: family member present during session        Exercises      Shoulder Instructions       General Comments      Pertinent Vitals/ Pain       Pain Assessment Pain Assessment: Faces Faces Pain Scale: Hurts even more Pain Location: R foot with touch and movement Pain Descriptors / Indicators: Grimacing, Guarding Pain Intervention(s): Monitored during session, Limited activity within patient's tolerance, Repositioned  Home Living                                           Prior Functioning/Environment              Frequency  Min 2X/week        Progress Toward Goals  OT Goals(current goals can now be found in the care plan section)  Progress towards OT goals: Progressing toward goals     Plan Discharge plan remains appropriate    Co-evaluation    PT/OT/SLP Co-Evaluation/Treatment: Yes Reason for Co-Treatment: For patient/therapist safety PT goals addressed during session: Mobility/safety with mobility OT goals addressed during session: ADL's and self-care      AM-PAC OT "6 Clicks" Daily Activity     Outcome Measure   Help from another person eating meals?: A Little Help from another person taking care of personal grooming?: A Little Help from another person toileting, which includes using toliet, bedpan, or urinal?: A Lot Help from another person bathing (including washing, rinsing, drying)?: A Lot Help from another person to put on and taking off regular upper body clothing?: A Lot Help from another person to put on and taking off regular lower body clothing?: A Lot 6 Click Score: 14    End of Session Equipment Utilized During Treatment: Gait belt  OT Visit Diagnosis: Unsteadiness on feet (R26.81);Muscle weakness (generalized) (M62.81);Cognitive communication deficit (R41.841);Other symptoms and signs involving cognitive function   Activity Tolerance Patient tolerated treatment well   Patient Left in chair;with chair alarm set;with call bell/phone within reach;with family/visitor present   Nurse Communication Mobility status        Time: [id number] OT Time Calculation (min): 32 min  Charges: OT General Charges $OT Visit: 1 Visit OT Treatments $Self Care/Home Management : 8-22 mins  Rennie Plowman, MS Acute Rehabilitation Department Office# [phone]   Marcellina Millin 12/29/2021, 11:55 AM

## 2021-12-29 NOTE — Progress Notes (Signed)
PROGRESS NOTE   [name] Juarez  IRS:[id number]    DOB: Aug 19, 1945    DOA: 12/26/2021  PCP: Shirline Frees, MD   I have briefly reviewed patients previous medical records in [facility].  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  Course complicated by recurrent AKI due to poor oral intake, IV fluids initiated.   Assessment & Plan:  Principal Problem:   Acute kidney injury superimposed on chronic kidney disease ([place]) Active Problems:   Hypertension   Shingles  Acute kidney injury complicating stage IIIa CKD secondary to dehydration in the context of recently diagnosed shingles: Last known creatinine 1.37 on 02/03/2020.  As per patient's daughter who is an Scientist, physiological for patient's PCP's office, most recent creatinine 12/08/2021 was 1.6.  S/p IV fluid bolus x2 L in ED followed by maintenance IV fluids.  Creatinine has gradually crept up, 1.65 > 1.75 > 1.95.  As per spouse at bedside, has not been drinking much.  Recurrent AKI.  IVF.  Trend daily BMP.  Also check bladder scan.   Dehydration: Secondary to poor oral intake.  Resumed IV fluids.  Encourage oral fluid intake, discussed with spouse.   Generalized weakness/physical deconditioning: Secondary to shingles complicating underlying moderate dementia with some parkinsonian features.  Treat underlying etiology as above.  PT and OT input appreciated and had recommended CIR which was declined because he was in observation stay.  Currently appropriate for  inpatient admission.  TOC to follow-up with CIR again.   Herpes zoster/shingles: Of L1 or L2 dermatome.  No pain or pruritus.  Continue Valtrex.  Airborne/contact isolation.  Lesion starting starting to crust.   Essential hypertension: Soft blood pressures.  Not on antihypertensives at home.   Hyperlipidemia: Not on meds PTA.   Moderate dementia with parkinsonian features: Delirium precautions.  Continue home dose of Namenda and Sinemet.  Outpatient follow-up with neurology. Although wife is not certain if the Sinemet is helping or not.  Diarrhea: Unclear etiology.  Not sure if he was constipated PTA and that is resolving now.  Resolved.  Chronic anemia: Stable.  May be related to his chronic kidney disease or anemia of chronic disease.  Dysphagia: Chest x-ray personally reviewed and reported as no active disease 9/25.  SLP input appreciated and recommending dysphagia 3 diet and thin liquids.  Body mass index is 25.24 kg/m.    DVT prophylaxis: enoxaparin (LOVENOX) injection 40 mg Start: 12/26/21 2200     Code Status: Full Code:  Family Communication: Spouse at bedside. D/W daughter, [name] [phone]). Disposition:  Status is: Appropriate to change to observation.  Communicated with [facility] RN.     Consultants:     Procedures:     Antimicrobials:   Valacyclovir 9/23 >   Subjective:  Patient remains a poor historian.  As per spouse at bedside, slept well last night.  Minimal intermittent dry cough.  Poor fluid intake.  Yesterday he wanted to get out of bed and walk.  She indicates that he has not been out of bed for 3 days.  Objective:  Vitals:   12/28/21 0634 12/28/21 1306 12/28/21 2241 12/29/21 0641  BP: 99/60 104/62 106/62 105/76  Pulse: 98 88 99 66  Resp: 20 19 17 17   Temp: 98 F (36.7 C) 98 F (36.7 C) 99.5 F (37.5 C) 98.7 F (37.1 C)  TempSrc: Oral  Oral Oral  SpO2: 93% 91% 93% 93%  Weight:      Height:        General exam: Elderly male,  moderately built and nourished lying lying comfortably propped up in bed without distress.  Oral mucosa dry. Respiratory system: Clear to auscultation.  No increased work of breathing. Cardiovascular system: S1 & S2 heard, RRR. No JVD, murmurs, rubs, gallops or clicks. No pedal edema. Gastrointestinal system: Abdomen is nondistended, soft and nontender. No organomegaly or masses felt. Normal bowel sounds heard. Central nervous system: Alert and oriented only to self. No focal neurological deficits. Extremities: Symmetric 5 x 5 power. Skin: Left groin (likely L1 or L2 dermatome) rash improving and now crusting over without new or acute findings.   Psychiatry: Judgement and insight impaired. Mood & affect appropriate.     Data Reviewed:   I have personally reviewed following labs and imaging studies   CBC: Recent Labs  Lab 12/26/21 1317 12/27/21 0633 12/28/21 0331  WBC 7.5 8.4 10.6*  NEUTROABS 5.6  --   --   HGB 11.8* 10.4* 10.9*  HCT 37.8* 32.7* 34.1*  MCV 95.7 95.1 94.5  PLT 183 153 0000000    Basic Metabolic Panel: Recent Labs  Lab 12/26/21 1317 12/27/21 0633 12/28/21 0331 12/29/21 0402  NA 141 139 138 138  K 4.3 3.8 3.8 4.3  CL 113* 110 110 109  CO2 22 21* 20* 22  GLUCOSE 122* 135* 158* 147*  BUN 35* 36* 40* 42*  CREATININE 1.92* 1.65* 1.75* 1.95*  CALCIUM 9.1 8.4* 8.3* 8.6*    Liver Function Tests: Recent Labs  Lab 12/26/21 1317  AST 19  ALT 17  ALKPHOS 37*  BILITOT 0.9  PROT 7.4  ALBUMIN 3.8    CBG: No results for input(s): "GLUCAP" in the last 168 hours.  Microbiology Studies:  No results found for this or any previous visit (from the past 240 hour(s)).  Radiology Studies:  DG CHEST PORT 1 VIEW  Result Date: 12/28/2021 CLINICAL DATA:  Cough. EXAM: PORTABLE CHEST 1 VIEW COMPARISON:  November 14, 2019. FINDINGS: The heart size and mediastinal contours are within normal limits. Both lungs are clear. The visualized skeletal structures are unremarkable.  IMPRESSION: No active disease. Electronically Signed   By: Marijo Conception M.D.   On: 12/28/2021 10:39    Scheduled Meds:    carbidopa-levodopa  1 tablet Oral Q8H   enoxaparin (LOVENOX) injection  40 mg Subcutaneous QHS   memantine  10 mg Oral BID   valACYclovir  1,000 mg Oral Q12H    Continuous Infusions:    lactated ringers 60 mL/hr at 12/29/21 0654     LOS: 0 days     Vernell Leep, MD,  FACP, [facility], [facility], [facility], [place]   To contact the attending provider between 7A-7P or the covering provider during after hours 7P-7A, please log into the web site [email or web address] and access using universal [place] password for that web site. If you do not have the password, please call the hospital operator.  12/29/2021, 8:57 AM

## 2021-12-29 NOTE — Progress Notes (Signed)
Physical Therapy Treatment Patient Details Name: [name] Juarez MRN: [id number] DOB: 01-29-1946 Today's Date: 12/29/2021   History of Present Illness Patient is 76 year old male admitted for acute renal failure. 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 He was able to ambulate across the room to the [facility] to have a BM. Total assist for hygiene. He then was able to ambulate across the room to the recliner. He continues to require +2 assist. Recommend AIR consult.     Recommendations for follow up therapy are one component of a multi-disciplinary discharge planning process, led by the attending physician.  Recommendations may be updated based on patient status, additional functional criteria and insurance authorization.  Follow Up Recommendations  Acute inpatient rehab (3hours/day)     Assistance Recommended at Discharge Frequent or constant Supervision/Assistance  Patient can return home with the following Two people to help with walking and/or transfers;Assistance with cooking/housework;Assist for transportation;Help with stairs or ramp for entrance;Direct supervision/assist for medications management;Direct supervision/assist for financial management;A lot of help with bathing/dressing/bathroom   Equipment Recommendations       Recommendations for Other Services       Precautions / Restrictions Precautions Precautions: Fall Restrictions Weight Bearing Restrictions: No     Mobility  Bed Mobility Overal bed mobility: Needs Assistance Bed Mobility: Supine to Sit     Supine to sit: Mod assist, HOB elevated     General bed mobility comments: with increased time and cues. assist for trunk and bil LEs. noted to have increased discomfort with touch of R foot--nurse aware    Transfers Overall transfer  level: Needs assistance Equipment used: 2 person hand held assist Transfers: Sit to/from Stand Sit to Stand: Mod assist, +2 safety/equipment, +2 physical assistance           General transfer comment: Assist to power up, stabilize, control descent. Multimodal cues for safety, technique, hand/foot placement. Increased time.    Ambulation/Gait Ambulation/Gait assistance: Mod assist, +2 physical assistance, +2 safety/equipment Gait Distance (Feet): 10 Feet (x2) Assistive device: 2 person hand held assist Gait Pattern/deviations: Step-to pattern, Trunk flexed       General Gait Details: Pt still has difficulty taking steps-this is compounded by acute R foot pain. However, he was able to ambulate across room to [facility] and back. Max cues and assist to weightshift and stabilize/support pt in standing.   Stairs             Wheelchair Mobility    Modified Rankin (Stroke Patients Only)       Balance Overall balance assessment: Needs assistance         Standing balance support: During functional activity, Bilateral upper extremity supported Standing balance-Leahy Scale: Poor                              Cognition Arousal/Alertness: Awake/alert Behavior During Therapy: WFL for tasks assessed/performed Overall Cognitive Status: History of cognitive impairments - at baseline                                          Exercises      General Comments        Pertinent Vitals/Pain Pain Assessment  Pain Assessment: Faces Faces Pain Scale: Hurts even more Pain Location: R foot with touch and movement/WBing Pain Descriptors / Indicators: Grimacing, Guarding Pain Intervention(s): Limited activity within patient's tolerance, Monitored during session, Repositioned    Home Living                          Prior Function            PT Goals (current goals can now be found in the care plan section) Progress towards PT goals:  Progressing toward goals    Frequency    Min 3X/week      PT Plan Current plan remains appropriate    Co-evaluation   Reason for Co-Treatment: For patient/therapist safety PT goals addressed during session: Mobility/safety with mobility OT goals addressed during session: ADL's and self-care      AM-PAC PT "6 Clicks" Mobility   Outcome Measure  Help needed turning from your back to your side while in a flat bed without using bedrails?: A Lot Help needed moving from lying on your back to sitting on the side of a flat bed without using bedrails?: A Lot Help needed moving to and from a bed to a chair (including a wheelchair)?: A Lot Help needed standing up from a chair using your arms (e.g., wheelchair or bedside chair)?: A Lot Help needed to walk in hospital room?: A Lot Help needed climbing 3-5 steps with a railing? : Total 6 Click Score: 11    End of Session Equipment Utilized During Treatment: Gait belt Activity Tolerance: Patient tolerated treatment well Patient left: in chair;with call bell/phone within reach;with family/visitor present;with chair alarm set   PT Visit Diagnosis: Muscle weakness (generalized) (M62.81);Difficulty in walking, not elsewhere classified (R26.2);Other symptoms and signs involving the nervous system (R29.898)     Time: [id number] PT Time Calculation (min) (ACUTE ONLY): 33 min  Charges:  $Gait Training: 8-22 mins                         Doreatha Massed, PT Acute Rehabilitation  Office: [phone] Pager: [phone]

## 2021-12-29 NOTE — Plan of Care (Signed)
  Problem: Clinical Measurements: Goal: Ability to maintain clinical measurements within normal limits will improve Outcome: Progressing   Problem: Elimination: Goal: Will not experience complications related to urinary retention Outcome: Progressing   Problem: Pain Managment: Goal: General experience of comfort will improve Outcome: Progressing   Problem: Safety: Goal: Ability to remain free from injury will improve Outcome: Progressing   

## 2021-12-30 DIAGNOSIS — N179 Acute kidney failure, unspecified: Secondary | ICD-10-CM | POA: Diagnosis not present

## 2021-12-30 DIAGNOSIS — N189 Chronic kidney disease, unspecified: Secondary | ICD-10-CM | POA: Diagnosis not present

## 2021-12-30 LAB — BASIC METABOLIC PANEL
Anion gap: 7 (ref 5–15)
BUN: 38 mg/dL — ABNORMAL HIGH (ref 8–23)
CO2: 21 mmol/L — ABNORMAL LOW (ref 22–32)
Calcium: 8.4 mg/dL — ABNORMAL LOW (ref 8.9–10.3)
Chloride: 110 mmol/L (ref 98–111)
Creatinine, Ser: 1.74 mg/dL — ABNORMAL HIGH (ref 0.61–1.24)
GFR, Estimated: 40 mL/min — ABNORMAL LOW (ref >60)
Glucose, Bld: 155 mg/dL — ABNORMAL HIGH (ref 70–99)
Potassium: 4 mmol/L (ref 3.5–5.1)
Sodium: 138 mmol/L (ref 135–145)

## 2021-12-30 NOTE — Plan of Care (Signed)

## 2021-12-30 NOTE — Progress Notes (Signed)
Inpatient Rehab Admissions Coordinator:    I continue to await insurance auth for this pt. I updated Pt.'s wife. She states that she would be amenable to SNF if insurance denies.   Clemens Catholic, [place], [place] Admissions Coordinator  [phone] ([place]) [phone] (office)

## 2021-12-30 NOTE — Progress Notes (Signed)
Physical Therapy Treatment Patient Details Name: [name] Juarez MRN: [id number] DOB: [birth year] Today's Date: 12/30/2021   History of Present Illness Patient is 76 year old male admitted for acute renal failure. Patient PMH: moderate dementia with associated possible parkinsonian features, hypertension, hyperlipidemia, CKD stage IIIb, PAD, pre-DM, COPD, right frontal CVA, shingles    PT Comments    Pt is progressing toward PT goals. Continues to require incr time, frequent multi-modal cues and 2 person assist for basic tasks. Shaya demonstrated incr tolerance to activity, incr amb distance this session. Requires a second person throughout for assist and/or safety-fall prevention.  He follows commands consistently with additional processing time. Continue to recommend post acute rehab, will benefit from SNF to incr independence, safety, prevent falls, and potential readmissions.   Recommendations for follow up therapy are one component of a multi-disciplinary discharge planning process, led by the attending physician.  Recommendations may be updated based on patient status, additional functional criteria and insurance authorization.  Follow Up Recommendations  Skilled nursing-short term rehab (<3 hours/day) Can patient physically be transported by private vehicle: No   Assistance Recommended at Discharge Frequent or constant Supervision/Assistance  Patient can return home with the following Two people to help with walking and/or transfers;Assistance with cooking/housework;Assist for transportation;Help with stairs or ramp for entrance;Direct supervision/assist for medications management;Direct supervision/assist for financial management;A lot of help with bathing/dressing/bathroom   Equipment Recommendations  Other (comment) (TBD)    Recommendations for Other Services       Precautions / Restrictions Precautions Precautions: Fall Restrictions Weight Bearing Restrictions: No     Mobility   Bed Mobility Overal bed mobility: Needs Assistance Bed Mobility: Rolling, Sidelying to Sit Rolling: Mod assist, Max assist, +2 for safety/equipment Sidelying to sit: Mod assist, +2 for safety/equipment, Max assist       General bed mobility comments: step by step multimodal cues, hand over hand to flex knees and roll to right side, assist to fully advance LEs off bed and elevate trunk    Transfers Overall transfer level: Needs assistance Equipment used: None Transfers: Sit to/from Stand Sit to Stand: Mod assist, From elevated surface, +2 safety/equipment           General transfer comment: bed elevated slightly, guided wt shift anteriorly-superiorly by therapist holding pt's R hand    Ambulation/Gait Ambulation/Gait assistance: Min assist, Mod assist, +2 physical assistance, +2 safety/equipment Gait Distance (Feet): 30 Feet Assistive device: Ethelene Hal (EVA walker/bil cushioned platform walker) Gait Pattern/deviations: Step-to pattern, Decreased step length - right, Decreased step length - left, Trunk flexed, Shuffle       General Gait Details: multi-modal cues for incr step length, trunk and hip extension. incr time needed to advance LEs   Stairs             Wheelchair Mobility    Modified Rankin (Stroke Patients Only)       Balance Overall balance assessment: Needs assistance Sitting-balance support: Single extremity supported, No upper extremity supported, Feet supported Sitting balance-Leahy Scale: Poor Sitting balance - Comments: close supervision, briefly able to maintain static sit; posterior LOB,cues and assist to correct Postural control: Posterior lean Standing balance support: During functional activity, Bilateral upper extremity supported, Reliant on assistive device for balance Standing balance-Leahy Scale: Poor Standing balance comment: reliant on device and external assist                            Cognition Arousal/Alertness:  Awake/alert Behavior  During Therapy: WFL for tasks assessed/performed Overall Cognitive Status: History of cognitive impairments - at baseline                                 General Comments: wife present during session        Exercises      General Comments        Pertinent Vitals/Pain Pain Assessment Pain Assessment: Faces Faces Pain Scale: Hurts little more Pain Location: chest -bil pec area Pain Descriptors / Indicators: Grimacing, Discomfort Pain Intervention(s): Limited activity within patient's tolerance, Monitored during session, Repositioned    Home Living                          Prior Function            PT Goals (current goals can now be found in the care plan section) Acute Rehab PT Goals Patient Stated Goal: to regain PLOF/independence PT Goal Formulation: With patient/family Time For Goal Achievement: 01/10/22 Potential to Achieve Goals: Good Progress towards PT goals: Progressing toward goals    Frequency    Min 3X/week      PT Plan Current plan remains appropriate    Co-evaluation              AM-PAC PT "6 Clicks" Mobility   Outcome Measure  Help needed turning from your back to your side while in a flat bed without using bedrails?: A Lot Help needed moving from lying on your back to sitting on the side of a flat bed without using bedrails?: A Lot Help needed moving to and from a bed to a chair (including a wheelchair)?: Total Help needed standing up from a chair using your arms (e.g., wheelchair or bedside chair)?: A Lot Help needed to walk in hospital room?: Total Help needed climbing 3-5 steps with a railing? : Total 6 Click Score: 9    End of Session Equipment Utilized During Treatment: Gait belt Activity Tolerance: Patient tolerated treatment well Patient left: in chair;with call bell/phone within reach;with family/visitor present;with chair alarm set   PT Visit Diagnosis: Muscle weakness  (generalized) (M62.81);Difficulty in walking, not elsewhere classified (R26.2);Other symptoms and signs involving the nervous system (R29.898)     Time: [id number] PT Time Calculation (min) (ACUTE ONLY): 18 min  Charges:  $Gait Training: 8-22 mins                     Baxter Flattery, PT  Acute Rehab Dept [facility]) [phone]  [facility] Weekend Pager [facility] only)  [phone]  12/30/2021    [facility] 12/30/2021, 4:36 PM

## 2021-12-30 NOTE — Plan of Care (Signed)
  Problem: Activity: Goal: Risk for activity intolerance will decrease Outcome: Progressing   Problem: Pain Managment: Goal: General experience of comfort will improve Outcome: Progressing   Problem: Safety: Goal: Ability to remain free from injury will improve Outcome: Progressing   

## 2021-12-30 NOTE — Progress Notes (Signed)
PROGRESS NOTE    [name] Juarez  UXL:[id number] DOB: 20-Jun-1945 DOA: 12/26/2021 PCP: Shirline Frees, MD    Brief Narrative:  76 year old gentleman with history of mild dementia and Parkinson's, hypertension, hyperlipidemia, CKD stage IIIb, peripheral artery disease, COPD and history of stroke presented on 9/23 with generalized weakness, inability to walk.  He was recently diagnosed with shingles on his left anterior groin and thigh.  He was found to have AKI and poor oral intake and was admitted to the hospital.   Assessment & Plan:   Acute kidney injury with history of chronic kidney disease stage IIIb, recent known baseline creatinine 1.6. Patient treated with IV fluids, currently oral intake is improving.  Appetite is improving.  Discontinue IV fluids and monitor. Creatinine is stabilizing, 1.74 today.  Will need to recheck in 1 week.  Moderate dehydration: Secondary to poor oral intake.  Encourage oral diet and fluid.  Generalized weakness and physical deconditioning: Underlying movement disorder and neurological disorders aggravated by outpatient zoster infection.  Improving.  Patient will need inpatient therapy at [facility] [place] versus SNF. Patient does follow-up with neurology as outpatient.  Herpes zoster : L1-L2 dermatome.  Valtrex for 10 days.  Lesions are crusted.  No indication for airborne isolation.  Fever: Source unknown.  Urine was clear.  Blood cultures negative so far.  Probably just secondary to zoster.  Monitor.  WBC count normal.  Patient's insurance company requested peer to peer review for CIR admission, call placed, waiting for physician response. If CIR is not approved, he will be able to go to SNF.   DVT prophylaxis: enoxaparin (LOVENOX) injection 40 mg Start: 12/26/21 2200   Code Status: Full code Family Communication: Wife at the bedside Disposition Plan: Status is: Inpatient Remains inpatient appropriate because: Unsafe discharge disposition plan     Consultants:   None  Procedures:  None  Antimicrobials:  Valtrex 9/23--   Subjective: Patient seen in the morning rounds.  Wife at the bedside.  Patient is poor historian.  He tells me that he is not having any trouble today.  Wife at the bedside he stated that he ate all his breakfast today morning.  Objective: Vitals:   12/29/21 2157 12/30/21 0139 12/30/21 0629 12/30/21 0932  BP: 108/70 107/78 119/71 120/73  Pulse: 99 99 100 95  Resp: 17 17 17 18   Temp: 98.2 F (36.8 C) 98.4 F (36.9 C) 97.8 F (36.6 C) 97.6 F (36.4 C)  TempSrc:    Oral  SpO2: 94% 96% 93% 94%  Weight:      Height:        Intake/Output Summary (Last 24 hours) at 12/30/2021 1312 Last data filed at 12/30/2021 1250 Gross per 24 hour  Intake 887.74 ml  Output 1350 ml  Net -462.26 ml   Filed Weights   12/26/21 1905 12/26/21 1907  Weight: 73.1 kg 73.1 kg    Examination:  General exam: Appears calm and comfortable  Frail debilitated.  Calm.  Flat affect.  On room air.  Not in any distress. Respiratory system: No added sounds. Cardiovascular system: S1 & S2 heard, RRR. No pedal edema. Gastrointestinal system: Abdomen is nondistended, soft and nontender. No organomegaly or masses felt. Normal bowel sounds heard. Skin:  Patient has drying crusted zoster lesions along the left anterior thigh, no drainage.     Data Reviewed: I have personally reviewed following labs and imaging studies  CBC: Recent Labs  Lab 12/26/21 1317 12/27/21 0633 12/28/21 0331 12/29/21 1440  WBC 7.5 8.4 10.6*  9.3  NEUTROABS 5.6  --   --  7.6  HGB 11.8* 10.4* 10.9* 12.1*  HCT 37.8* 32.7* 34.1* 39.4  MCV 95.7 95.1 94.5 98.0  PLT 183 153 158 [id number]   Basic Metabolic Panel: Recent Labs  Lab 12/26/21 1317 12/27/21 0633 12/28/21 0331 12/29/21 0402 12/30/21 0344  NA 141 139 138 138 138  K 4.3 3.8 3.8 4.3 4.0  CL 113* 110 110 109 110  CO2 22 21* 20* 22 21*  GLUCOSE 122* 135* 158* 147* 155*  BUN 35* 36* 40* 42* 38*  CREATININE  1.92* 1.65* 1.75* 1.95* 1.74*  CALCIUM 9.1 8.4* 8.3* 8.6* 8.4*   GFR: Estimated Creatinine Clearance: 33.2 mL/min (A) (by C-G formula based on SCr of 1.74 mg/dL (H)). Liver Function Tests: Recent Labs  Lab 12/26/21 1317  AST 19  ALT 17  ALKPHOS 37*  BILITOT 0.9  PROT 7.4  ALBUMIN 3.8   No results for input(s): "LIPASE", "AMYLASE" in the last 168 hours. No results for input(s): "AMMONIA" in the last 168 hours. Coagulation Profile: No results for input(s): "INR", "PROTIME" in the last 168 hours. Cardiac Enzymes: No results for input(s): "CKTOTAL", "CKMB", "CKMBINDEX", "TROPONINI" in the last 168 hours. BNP (last 3 results) No results for input(s): "PROBNP" in the last 8760 hours. HbA1C: No results for input(s): "HGBA1C" in the last 72 hours. CBG: No results for input(s): "GLUCAP" in the last 168 hours. Lipid Profile: No results for input(s): "CHOL", "HDL", "LDLCALC", "TRIG", "CHOLHDL", "LDLDIRECT" in the last 72 hours. Thyroid Function Tests: No results for input(s): "TSH", "T4TOTAL", "FREET4", "T3FREE", "THYROIDAB" in the last 72 hours. Anemia Panel: No results for input(s): "VITAMINB12", "FOLATE", "FERRITIN", "TIBC", "IRON", "RETICCTPCT" in the last 72 hours. Sepsis Labs: No results for input(s): "PROCALCITON", "LATICACIDVEN" in the last 168 hours.  Recent Results (from the past 240 hour(s))  Culture, blood (Routine X 2) w Reflex to ID Panel     Status: None (Preliminary result)   Collection Time: 12/29/21  2:40 PM   Specimen: BLOOD  Result Value Ref Range Status   Specimen Description   Final    BLOOD BLOOD RIGHT HAND Performed at [facility], [place] [address], [place], [place] [address]    Special Requests   Final    BOTTLES DRAWN AEROBIC ONLY Blood Culture adequate volume Performed at [facility] [address], [place], [place] [address]    Culture   Final    NO GROWTH < 24 HOURS Performed at [facility] [address] [place], [place] [address]    Report Status PENDING  Incomplete  Culture, blood (Routine X 2) w Reflex to ID Panel     Status: None (Preliminary result)   Collection Time: 12/29/21  2:40 PM   Specimen: BLOOD  Result Value Ref Range Status   Specimen Description   Final    BLOOD BLOOD RIGHT ARM Performed at [facility] [address], [place], [place] [address]    Special Requests   Final    BOTTLES DRAWN AEROBIC ONLY Blood Culture adequate volume Performed at [facility] [address], [place], [place] [address]    Culture   Final    NO GROWTH < 24 HOURS Performed at [facility] [address] [place], [place] [address]    Report Status PENDING  Incomplete  Respiratory (~20 pathogens) panel by PCR     Status: None   Collection Time: 12/29/21  3:00 PM   Specimen: Nasopharyngeal Swab; Respiratory  Result Value Ref Range Status   Adenovirus NOT DETECTED NOT DETECTED Final   Coronavirus 229E NOT DETECTED NOT DETECTED Final    Comment: (NOTE) The Coronavirus on the Respiratory Panel, DOES NOT test for the novel  Coronavirus (2019 nCoV)    Coronavirus HKU1 NOT DETECTED NOT DETECTED Final   Coronavirus NL63 NOT DETECTED NOT DETECTED Final   Coronavirus OC43 NOT DETECTED NOT DETECTED Final   Metapneumovirus NOT DETECTED NOT DETECTED Final   Rhinovirus / Enterovirus NOT DETECTED NOT DETECTED Final   Influenza A NOT DETECTED NOT DETECTED Final   Influenza B NOT DETECTED NOT DETECTED Final   Parainfluenza Virus 1 NOT DETECTED NOT DETECTED Final   Parainfluenza Virus 2 NOT DETECTED NOT DETECTED Final   Parainfluenza Virus 3 NOT DETECTED NOT DETECTED Final   Parainfluenza Virus 4 NOT DETECTED NOT DETECTED Final   Respiratory Syncytial Virus NOT DETECTED NOT DETECTED Final   Bordetella pertussis NOT DETECTED NOT DETECTED Final   Bordetella Parapertussis NOT DETECTED NOT DETECTED Final   Chlamydophila pneumoniae NOT DETECTED NOT  DETECTED Final   Mycoplasma pneumoniae NOT DETECTED NOT DETECTED Final    Comment: Performed at [facility], [place] [address] [place], [place] [address]  SARS Coronavirus 2 by RT PCR (hospital order, performed in [facility] hospital lab) *cepheid single result test* Anterior Nasal Swab     Status: None   Collection Time: 12/29/21  3:00 PM   Specimen: Anterior Nasal Swab  Result Value Ref Range Status   SARS Coronavirus 2 by RT PCR NEGATIVE NEGATIVE Final    Comment: (NOTE) SARS-CoV-2 target nucleic acids are NOT DETECTED.  The SARS-CoV-2 RNA is generally detectable in upper and lower respiratory specimens during the acute phase of infection. The lowest concentration of SARS-CoV-2 viral copies this assay can detect is 250 copies / mL. A negative result does not preclude SARS-CoV-2 infection and should not be used as the sole basis for treatment or other patient management decisions.  A negative result may occur with improper specimen collection / handling, submission of specimen other than nasopharyngeal swab, presence of viral mutation(s) within the areas targeted by this assay, and inadequate number of viral copies (<250 copies / mL). A negative result must be combined with clinical observations, patient history, and epidemiological information.  Fact Sheet for Patients:   https://www.patel.info/  Fact Sheet for Healthcare Providers: https://hall.com/  This test is not yet approved or  cleared by the [place] FDA and has been authorized for detection and/or diagnosis of SARS-CoV-2 by FDA under an Emergency Use Authorization (EUA).  This EUA will remain in effect (meaning this test can be used) for the duration of the COVID-19 declaration under Section 564(b)(1) of the Act, 21 U.S.C. section 360bbb-3(b)(1), unless the authorization is terminated or revoked sooner.  Performed at [facility], [place] [address] [place], [place] [address]          Radiology Studies: DG Ankle Complete Right  Result Date: 12/29/2021 CLINICAL DATA:  Pain. EXAM: RIGHT ANKLE - COMPLETE 3+ VIEW; RIGHT FOOT COMPLETE - 3+ VIEW COMPARISON:  None Available. FINDINGS: Ankle: There is no acute fracture or dislocation. Bony alignment is normal. The ankle mortise is intact. The soft tissues are unremarkable. Foot: There is no acute fracture or dislocation. There is mild hallux valgus. Alignment is otherwise normal. The Lisfranc and Chopart joints are intact. The soft tissues are unremarkable. IMPRESSION: No evidence  of acute injury in the ankle or foot. Electronically Signed   By: Valetta Mole M.D.   On: 12/29/2021 15:27   DG Foot Complete Right  Result Date: 12/29/2021 CLINICAL DATA:  Pain. EXAM: RIGHT ANKLE - COMPLETE 3+ VIEW; RIGHT FOOT COMPLETE - 3+ VIEW COMPARISON:  None Available. FINDINGS: Ankle: There is no acute fracture or dislocation. Bony alignment is normal. The ankle mortise is intact. The soft tissues are unremarkable. Foot: There is no acute fracture or dislocation. There is mild hallux valgus. Alignment is otherwise normal. The Lisfranc and Chopart joints are intact. The soft tissues are unremarkable. IMPRESSION: No evidence of acute injury in the ankle or foot. Electronically Signed   By: Valetta Mole M.D.   On: 12/29/2021 15:27        Scheduled Meds:  carbidopa-levodopa  1 tablet Oral Q8H   enoxaparin (LOVENOX) injection  40 mg Subcutaneous QHS   memantine  10 mg Oral BID   valACYclovir  1,000 mg Oral Q12H   Continuous Infusions:   LOS: 1 day    Time spent: 35 minutes    Barb Merino, MD [facility] Pager [phone]

## 2021-12-31 DIAGNOSIS — N189 Chronic kidney disease, unspecified: Secondary | ICD-10-CM | POA: Diagnosis not present

## 2021-12-31 DIAGNOSIS — N179 Acute kidney failure, unspecified: Secondary | ICD-10-CM | POA: Diagnosis not present

## 2021-12-31 NOTE — Plan of Care (Signed)

## 2021-12-31 NOTE — Progress Notes (Signed)
IP rehab admissions - We have received a denial for acute inpatient rehab admission.  I spoke with patient's wife and she does not want to appeal this decision.  Wife would like to move forward so that patient can get therapies.  Likely will need SNF placement for therapies.  Call me for questions.  [phone]

## 2021-12-31 NOTE — Plan of Care (Signed)
  Problem: Activity: Goal: Risk for activity intolerance will decrease Outcome: Progressing   Problem: Pain Managment: Goal: General experience of comfort will improve Outcome: Progressing   Problem: Safety: Goal: Ability to remain free from injury will improve Outcome: Progressing   

## 2021-12-31 NOTE — Progress Notes (Signed)
PROGRESS NOTE    [name] Juarez  TMH:[id number] DOB: Oct 30, 1945 DOA: 12/26/2021 PCP: Shirline Frees, MD    Brief Narrative:  76 year old gentleman with history of mild dementia and Parkinson's, hypertension, hyperlipidemia, CKD stage IIIb, peripheral artery disease, COPD and history of stroke presented on 9/23 with generalized weakness, inability to walk.  He was recently diagnosed with shingles on his left anterior groin and thigh.  He was found to have AKI and poor oral intake and was admitted to the hospital.   Assessment & Plan:   Acute kidney injury with history of chronic kidney disease stage IIIb, recent known baseline creatinine 1.6. Patient treated with IV fluids, currently oral intake is improving.  Appetite is improving.  Discontinue IV fluids and monitor. Creatinine is stabilizing. Will need to recheck in 1 week.  Moderate dehydration: Secondary to poor oral intake.  Encourage oral diet and fluid.  Improving.  Generalized weakness and physical deconditioning: Underlying movement disorder and neurological disorders aggravated by outpatient zoster infection.  Improving.  Patient will need inpatient therapy at [facility] versus SNF. Patient does follow-up with neurology as outpatient.  Herpes zoster : L1-L2 dermatome.  Valtrex for 10 days.  Lesions are crusted.  No indication for airborne isolation.  Fever: Source unknown.  Urine was clear.  Blood cultures negative so far.  Probably just secondary to zoster.  Afebrile last 24 hours.  Patient's insurance company requested peer to peer review for CIR admission, denied after peer to peer review. Refer to SNF for rehab. Medically stable to transfer when bed available.   DVT prophylaxis: enoxaparin (LOVENOX) injection 40 mg Start: 12/26/21 2200   Code Status: Full code Family Communication: Wife at the bedside Disposition Plan: Status is: Inpatient Remains inpatient appropriate because: Unsafe discharge disposition plan.  Stable to  SNF.     Consultants:  None  Procedures:  None  Antimicrobials:  Valtrex 9/23--   Subjective: Patient seen and examined.  Denies any complaints.  No new events.  Wife at the bedside.  She thinks his tremors are better.  She thinks his appetite is better.  Objective: Vitals:   12/30/21 0629 12/30/21 0932 12/30/21 1346 12/31/21 0505  BP: 119/71 120/73 116/71 121/74  Pulse: 100 95 90 (!) 102  Resp: 17 18 16 18   Temp: 97.8 F (36.6 C) 97.6 F (36.4 C) 98.5 F (36.9 C) (!) 97.5 F (36.4 C)  TempSrc:  Oral    SpO2: 93% 94% 93% 95%  Weight:      Height:        Intake/Output Summary (Last 24 hours) at 12/31/2021 1134 Last data filed at 12/31/2021 0900 Gross per 24 hour  Intake 580 ml  Output 1150 ml  Net -570 ml    Filed Weights   12/26/21 1905 12/26/21 1907  Weight: 73.1 kg 73.1 kg    Examination:  General exam: Appears calm and comfortable.  Frail and debilitated.  Not in any distress.  Chronically sick looking.  Flat affect. Respiratory system: No added sounds. Cardiovascular system: S1 & S2 heard, RRR. No pedal edema. Gastrointestinal system: Abdomen is nondistended, soft and nontender. No organomegaly or masses felt. Normal bowel sounds heard. Skin:  Patient has drying crusted zoster lesions along the left anterior thigh, no drainage.     Data Reviewed: I have personally reviewed following labs and imaging studies  CBC: Recent Labs  Lab 12/26/21 1317 12/27/21 0633 12/28/21 0331 12/29/21 1440  WBC 7.5 8.4 10.6* 9.3  NEUTROABS 5.6  --   --  7.6  HGB 11.8* 10.4* 10.9* 12.1*  HCT 37.8* 32.7* 34.1* 39.4  MCV 95.7 95.1 94.5 98.0  PLT 183 153 158 [id number]    Basic Metabolic Panel: Recent Labs  Lab 12/26/21 1317 12/27/21 0633 12/28/21 0331 12/29/21 0402 12/30/21 0344  NA 141 139 138 138 138  K 4.3 3.8 3.8 4.3 4.0  CL 113* 110 110 109 110  CO2 22 21* 20* 22 21*  GLUCOSE 122* 135* 158* 147* 155*  BUN 35* 36* 40* 42* 38*  CREATININE 1.92* 1.65*  1.75* 1.95* 1.74*  CALCIUM 9.1 8.4* 8.3* 8.6* 8.4*    GFR: Estimated Creatinine Clearance: 33.2 mL/min (A) (by C-G formula based on SCr of 1.74 mg/dL (H)). Liver Function Tests: Recent Labs  Lab 12/26/21 1317  AST 19  ALT 17  ALKPHOS 37*  BILITOT 0.9  PROT 7.4  ALBUMIN 3.8    No results for input(s): "LIPASE", "AMYLASE" in the last 168 hours. No results for input(s): "AMMONIA" in the last 168 hours. Coagulation Profile: No results for input(s): "INR", "PROTIME" in the last 168 hours. Cardiac Enzymes: No results for input(s): "CKTOTAL", "CKMB", "CKMBINDEX", "TROPONINI" in the last 168 hours. BNP (last 3 results) No results for input(s): "PROBNP" in the last 8760 hours. HbA1C: No results for input(s): "HGBA1C" in the last 72 hours. CBG: No results for input(s): "GLUCAP" in the last 168 hours. Lipid Profile: No results for input(s): "CHOL", "HDL", "LDLCALC", "TRIG", "CHOLHDL", "LDLDIRECT" in the last 72 hours. Thyroid Function Tests: No results for input(s): "TSH", "T4TOTAL", "FREET4", "T3FREE", "THYROIDAB" in the last 72 hours. Anemia Panel: No results for input(s): "VITAMINB12", "FOLATE", "FERRITIN", "TIBC", "IRON", "RETICCTPCT" in the last 72 hours. Sepsis Labs: No results for input(s): "PROCALCITON", "LATICACIDVEN" in the last 168 hours.  Recent Results (from the past 240 hour(s))  Culture, blood (Routine X 2) w Reflex to ID Panel     Status: None (Preliminary result)   Collection Time: 12/29/21  2:40 PM   Specimen: BLOOD  Result Value Ref Range Status   Specimen Description   Final    BLOOD BLOOD RIGHT HAND Performed at [facility], [place] [address], [place], [place] [address]    Special Requests   Final    BOTTLES DRAWN AEROBIC ONLY Blood Culture adequate volume Performed at [place] [address] [place], [place] [address]    Culture   Final    NO GROWTH 2 DAYS Performed at [place] [address],  [place], [place] [address]    Report Status PENDING  Incomplete  Culture, blood (Routine X 2) w Reflex to ID Panel     Status: None (Preliminary result)   Collection Time: 12/29/21  2:40 PM   Specimen: BLOOD  Result Value Ref Range Status   Specimen Description   Final    BLOOD BLOOD RIGHT ARM Performed at [place] [address], [place], [place] [address]    Special Requests   Final    BOTTLES DRAWN AEROBIC ONLY Blood Culture adequate volume Performed at [place] [address] [place], [place] [address]    Culture   Final    NO GROWTH 2 DAYS Performed at [place] [address], [place], [place] [address]    Report Status PENDING  Incomplete  Respiratory (~20 pathogens) panel by PCR     Status: None   Collection Time: 12/29/21  3:00 PM   Specimen: Nasopharyngeal Swab; Respiratory  Result Value Ref Range Status   Adenovirus NOT DETECTED NOT DETECTED Final   Coronavirus 229E NOT DETECTED NOT DETECTED Final    Comment: (NOTE) The Coronavirus on the Respiratory Panel, DOES NOT test for the novel  Coronavirus (2019 nCoV)    Coronavirus HKU1 NOT DETECTED NOT DETECTED Final   Coronavirus NL63 NOT DETECTED NOT DETECTED Final   Coronavirus OC43 NOT DETECTED NOT DETECTED Final   Metapneumovirus NOT DETECTED NOT DETECTED Final   Rhinovirus / Enterovirus NOT DETECTED NOT DETECTED Final   Influenza A NOT DETECTED NOT DETECTED Final   Influenza B NOT DETECTED NOT DETECTED Final   Parainfluenza Virus 1 NOT DETECTED NOT DETECTED Final   Parainfluenza Virus 2 NOT DETECTED NOT DETECTED Final   Parainfluenza Virus 3 NOT DETECTED NOT DETECTED Final   Parainfluenza Virus 4 NOT DETECTED NOT DETECTED Final   Respiratory Syncytial Virus NOT DETECTED NOT DETECTED Final   Bordetella pertussis NOT DETECTED NOT DETECTED Final   Bordetella Parapertussis NOT DETECTED NOT DETECTED Final   Chlamydophila pneumoniae NOT DETECTED NOT DETECTED Final    Mycoplasma pneumoniae NOT DETECTED NOT DETECTED Final    Comment: Performed at [facility], [place] [address] [place], [place] [address]  SARS Coronavirus 2 by RT PCR (hospital order, performed in [facility] hospital lab) *cepheid single result test* Anterior Nasal Swab     Status: None   Collection Time: 12/29/21  3:00 PM   Specimen: Anterior Nasal Swab  Result Value Ref Range Status   SARS Coronavirus 2 by RT PCR NEGATIVE NEGATIVE Final    Comment: (NOTE) SARS-CoV-2 target nucleic acids are NOT DETECTED.  The SARS-CoV-2 RNA is generally detectable in upper and lower respiratory specimens during the acute phase of infection. The lowest concentration of SARS-CoV-2 viral copies this assay can detect is 250 copies / mL. A negative result does not preclude SARS-CoV-2 infection and should not be used as the sole basis for treatment or other patient management decisions.  A negative result may occur with improper specimen collection / handling, submission of specimen other than nasopharyngeal swab, presence of viral mutation(s) within the areas targeted by this assay, and inadequate number of viral copies (<250 copies / mL). A negative result must be combined with clinical observations, patient history, and epidemiological information.  Fact Sheet for Patients:   https://www.patel.info/  Fact Sheet for Healthcare Providers: https://hall.com/  This test is not yet approved or  cleared by the [place] FDA and has been authorized for detection and/or diagnosis of SARS-CoV-2 by FDA under an Emergency Use Authorization (EUA).  This EUA will remain in effect (meaning this test can be used) for the duration of the COVID-19 declaration under Section 564(b)(1) of the Act, 21 U.S.C. section 360bbb-3(b)(1), unless the authorization is terminated or revoked sooner.  Performed at [facility], [place] [address] [place],  [place] [address]          Radiology Studies: DG Ankle Complete Right  Result Date: 12/29/2021 CLINICAL DATA:  Pain. EXAM: RIGHT ANKLE - COMPLETE 3+ VIEW; RIGHT FOOT COMPLETE - 3+ VIEW COMPARISON:  None Available. FINDINGS: Ankle: There is no acute fracture or dislocation. Bony alignment is normal. The ankle mortise is intact. The soft tissues are unremarkable. Foot: There is no acute fracture or dislocation. There is mild hallux valgus. Alignment is otherwise normal. The Lisfranc and Chopart joints are intact. The soft tissues are unremarkable. IMPRESSION: No evidence of acute injury in the ankle or foot. Electronically  Signed   By: Valetta Mole M.D.   On: 12/29/2021 15:27   DG Foot Complete Right  Result Date: 12/29/2021 CLINICAL DATA:  Pain. EXAM: RIGHT ANKLE - COMPLETE 3+ VIEW; RIGHT FOOT COMPLETE - 3+ VIEW COMPARISON:  None Available. FINDINGS: Ankle: There is no acute fracture or dislocation. Bony alignment is normal. The ankle mortise is intact. The soft tissues are unremarkable. Foot: There is no acute fracture or dislocation. There is mild hallux valgus. Alignment is otherwise normal. The Lisfranc and Chopart joints are intact. The soft tissues are unremarkable. IMPRESSION: No evidence of acute injury in the ankle or foot. Electronically Signed   By: Valetta Mole M.D.   On: 12/29/2021 15:27        Scheduled Meds:  carbidopa-levodopa  1 tablet Oral Q8H   enoxaparin (LOVENOX) injection  40 mg Subcutaneous QHS   memantine  10 mg Oral BID   valACYclovir  1,000 mg Oral Q12H   Continuous Infusions:   LOS: 2 days    Time spent: 35 minutes    Barb Merino, MD [facility] Pager [phone]

## 2022-01-01 DIAGNOSIS — N179 Acute kidney failure, unspecified: Secondary | ICD-10-CM | POA: Diagnosis not present

## 2022-01-01 DIAGNOSIS — N189 Chronic kidney disease, unspecified: Secondary | ICD-10-CM | POA: Diagnosis not present

## 2022-01-01 MED ORDER — ORAL CARE MOUTH RINSE: 15.0000 mL | OROMUCOSAL | Status: DC | PRN

## 2022-01-01 NOTE — Plan of Care (Signed)
  Problem: Pain Managment: Goal: General experience of comfort will improve Outcome: Progressing   Problem: Safety: Goal: Ability to remain free from injury will improve Outcome: Progressing   

## 2022-01-01 NOTE — Progress Notes (Signed)
PROGRESS NOTE    [name] Juarez  [id number] DOB: [birth year] DOA: 12/26/2021 PCP: Shirline Frees, MD    Brief Narrative:  76 year old gentleman with history of mild dementia and Parkinson's, hypertension, hyperlipidemia, CKD stage IIIb, peripheral artery disease, COPD and history of stroke presented on 9/23 with generalized weakness, inability to walk.  He was recently diagnosed with shingles on his left anterior groin and thigh.  He was found to have AKI and poor oral intake and was admitted to the hospital.   Assessment & Plan:   Acute kidney injury with history of chronic kidney disease stage IIIb, recent known baseline creatinine 1.6. Patient treated with IV fluids, currently oral intake is improving.  Appetite is improving.  Discontinue IV fluids and monitor. Creatinine is stabilizing. Will need to recheck in 1 week.  Moderate dehydration: Secondary to poor oral intake.  Encourage oral diet and fluid.  Improving.  Generalized weakness and physical deconditioning: Underlying movement disorder and neurological disorders aggravated by outpatient zoster infection.  Improving.  Patient will need inpatient [facility] at [facility] versus SNF. Patient does follow-up with neurology as outpatient.  Herpes zoster : L1-L2 dermatome.  Valtrex for 10 days.  Lesions are crusted.  No indication for airborne isolation.  Patient's insurance company requested peer to peer review for CIR admission, denied after peer to peer review. Refer to SNF for rehab. Medically stable to transfer when bed available.   DVT prophylaxis: enoxaparin (LOVENOX) injection 40 mg Start: 12/26/21 2200   Code Status: Full code Family Communication: Wife at the bedside Disposition Plan: Status is: Inpatient Remains inpatient appropriate because: Unsafe discharge disposition plan.  Stable to SNF.     Consultants:  None  Procedures:  None  Antimicrobials:  Valtrex 9/23--   Subjective: No overnight events.  Appetite is  better.  Frustrated with unable to move around.  Patient tells me he is getting more weaker because of getting stuck in the hospital.  Objective: Vitals:   12/31/21 0505 12/31/21 1356 12/31/21 2109 01/01/22 0632  BP: 121/74 (!) 108/59 136/81 130/73  Pulse: (!) 102 87 (!) 102 90  Resp: 18 17 16 18   Temp: (!) 97.5 F (36.4 C) 98 F (36.7 C) 98.5 F (36.9 C) 98.5 F (36.9 C)  TempSrc:   Oral Oral  SpO2: 95% 94% 94% 94%  Weight:      Height:        Intake/Output Summary (Last 24 hours) at 01/01/2022 1244 Last data filed at 01/01/2022 1044 Gross per 24 hour  Intake 480 ml  Output 1100 ml  Net -620 ml    Filed Weights   12/26/21 1905 12/26/21 1907  Weight: 73.1 kg 73.1 kg    Examination:  General exam: Appears calm and comfortable.  Frail and debilitated. Chronically sick looking.  Respiratory system: No added sounds. Cardiovascular system: S1 & S2 heard, RRR. No pedal edema. Gastrointestinal system: Abdomen is nondistended, soft and nontender. No organomegaly or masses felt. Normal bowel sounds heard. Skin:  Patient has drying crusted zoster lesions along the left anterior thigh, no drainage.     Data Reviewed: I have personally reviewed following labs and imaging studies  CBC: Recent Labs  Lab 12/26/21 1317 12/27/21 0633 12/28/21 0331 12/29/21 1440  WBC 7.5 8.4 10.6* 9.3  NEUTROABS 5.6  --   --  7.6  HGB 11.8* 10.4* 10.9* 12.1*  HCT 37.8* 32.7* 34.1* 39.4  MCV 95.7 95.1 94.5 98.0  PLT 183 153 158 [id number]    Basic Metabolic Panel:  Recent Labs  Lab 12/26/21 1317 12/27/21 0633 12/28/21 0331 12/29/21 0402 12/30/21 0344  NA 141 139 138 138 138  K 4.3 3.8 3.8 4.3 4.0  CL 113* 110 110 109 110  CO2 22 21* 20* 22 21*  GLUCOSE 122* 135* 158* 147* 155*  BUN 35* 36* 40* 42* 38*  CREATININE 1.92* 1.65* 1.75* 1.95* 1.74*  CALCIUM 9.1 8.4* 8.3* 8.6* 8.4*    GFR: Estimated Creatinine Clearance: 33.2 mL/min (A) (by C-G formula based on SCr of 1.74 mg/dL  (H)). Liver Function Tests: Recent Labs  Lab 12/26/21 1317  AST 19  ALT 17  ALKPHOS 37*  BILITOT 0.9  PROT 7.4  ALBUMIN 3.8    No results for input(s): "LIPASE", "AMYLASE" in the last 168 hours. No results for input(s): "AMMONIA" in the last 168 hours. Coagulation Profile: No results for input(s): "INR", "PROTIME" in the last 168 hours. Cardiac Enzymes: No results for input(s): "CKTOTAL", "CKMB", "CKMBINDEX", "TROPONINI" in the last 168 hours. BNP (last 3 results) No results for input(s): "PROBNP" in the last 8760 hours. HbA1C: No results for input(s): "HGBA1C" in the last 72 hours. CBG: No results for input(s): "GLUCAP" in the last 168 hours. Lipid Profile: No results for input(s): "CHOL", "HDL", "LDLCALC", "TRIG", "CHOLHDL", "LDLDIRECT" in the last 72 hours. Thyroid Function Tests: No results for input(s): "TSH", "T4TOTAL", "FREET4", "T3FREE", "THYROIDAB" in the last 72 hours. Anemia Panel: No results for input(s): "VITAMINB12", "FOLATE", "FERRITIN", "TIBC", "IRON", "RETICCTPCT" in the last 72 hours. Sepsis Labs: No results for input(s): "PROCALCITON", "LATICACIDVEN" in the last 168 hours.  Recent Results (from the past 240 hour(s))  Culture, blood (Routine X 2) w Reflex to ID Panel     Status: None (Preliminary result)   Collection Time: 12/29/21  2:40 PM   Specimen: BLOOD  Result Value Ref Range Status   Specimen Description   Final    BLOOD BLOOD RIGHT HAND Performed at [facility], [place] [address] [place], [place] [address]    Special Requests   Final    BOTTLES DRAWN AEROBIC ONLY Blood Culture adequate volume Performed at [place] [address], [place], [place] [address]    Culture   Final    NO GROWTH 3 DAYS Performed at [facility], [place] [address], [place], [place] [address]    Report Status PENDING  Incomplete  Culture, blood (Routine X 2) w Reflex to ID Panel     Status: None (Preliminary result)    Collection Time: 12/29/21  2:40 PM   Specimen: BLOOD  Result Value Ref Range Status   Specimen Description   Final    BLOOD BLOOD RIGHT ARM Performed at [place] [address] [place], [place] [address]    Special Requests   Final    BOTTLES DRAWN AEROBIC ONLY Blood Culture adequate volume Performed at [place] [address], [place], [place] [address]    Culture   Final    NO GROWTH 3 DAYS Performed at [facility], [place] [address] [place], [place] [address]    Report Status PENDING  Incomplete  Respiratory (~20 pathogens) panel by PCR     Status: None   Collection Time: 12/29/21  3:00 PM   Specimen: Nasopharyngeal Swab; Respiratory  Result Value Ref Range Status   Adenovirus NOT DETECTED NOT DETECTED Final   Coronavirus 229E NOT DETECTED NOT DETECTED Final    Comment: (NOTE) The Coronavirus on the  Respiratory Panel, DOES NOT test for the novel  Coronavirus (2019 nCoV)    Coronavirus HKU1 NOT DETECTED NOT DETECTED Final   Coronavirus NL63 NOT DETECTED NOT DETECTED Final   Coronavirus OC43 NOT DETECTED NOT DETECTED Final   Metapneumovirus NOT DETECTED NOT DETECTED Final   Rhinovirus / Enterovirus NOT DETECTED NOT DETECTED Final   Influenza A NOT DETECTED NOT DETECTED Final   Influenza B NOT DETECTED NOT DETECTED Final   Parainfluenza Virus 1 NOT DETECTED NOT DETECTED Final   Parainfluenza Virus 2 NOT DETECTED NOT DETECTED Final   Parainfluenza Virus 3 NOT DETECTED NOT DETECTED Final   Parainfluenza Virus 4 NOT DETECTED NOT DETECTED Final   Respiratory Syncytial Virus NOT DETECTED NOT DETECTED Final   Bordetella pertussis NOT DETECTED NOT DETECTED Final   Bordetella Parapertussis NOT DETECTED NOT DETECTED Final   Chlamydophila pneumoniae NOT DETECTED NOT DETECTED Final   Mycoplasma pneumoniae NOT DETECTED NOT DETECTED Final    Comment: Performed at [facility], [place] [address], [place], [place] [address]  SARS  Coronavirus 2 by RT PCR (hospital order, performed in [facility] hospital lab) *cepheid single result test* Anterior Nasal Swab     Status: None   Collection Time: 12/29/21  3:00 PM   Specimen: Anterior Nasal Swab  Result Value Ref Range Status   SARS Coronavirus 2 by RT PCR NEGATIVE NEGATIVE Final    Comment: (NOTE) SARS-CoV-2 target nucleic acids are NOT DETECTED.  The SARS-CoV-2 RNA is generally detectable in upper and lower respiratory specimens during the acute phase of infection. The lowest concentration of SARS-CoV-2 viral copies this assay can detect is 250 copies / mL. A negative result does not preclude SARS-CoV-2 infection and should not be used as the sole basis for treatment or other patient management decisions.  A negative result may occur with improper specimen collection / handling, submission of specimen other than nasopharyngeal swab, presence of viral mutation(s) within the areas targeted by this assay, and inadequate number of viral copies (<250 copies / mL). A negative result must be combined with clinical observations, patient history, and epidemiological information.  Fact Sheet for Patients:   https://www.patel.info/  Fact Sheet for Healthcare Providers: https://hall.com/  This test is not yet approved or  cleared by the [place] FDA and has been authorized for detection and/or diagnosis of SARS-CoV-2 by FDA under an Emergency Use Authorization (EUA).  This EUA will remain in effect (meaning this test can be used) for the duration of the COVID-19 declaration under Section 564(b)(1) of the Act, 21 U.S.C. section 360bbb-3(b)(1), unless the authorization is terminated or revoked sooner.  Performed at [facility], [place] [address] [place], [place] [address]          Radiology Studies: No results found.      Scheduled Meds:  carbidopa-levodopa  1 tablet Oral Q8H   enoxaparin  (LOVENOX) injection  40 mg Subcutaneous QHS   memantine  10 mg Oral BID   valACYclovir  1,000 mg Oral Q12H   Continuous Infusions:   LOS: 3 days    Time spent: 35 minutes    Barb Merino, MD [facility] Pager [phone]

## 2022-01-01 NOTE — TOC Progression Note (Addendum)
  Transition of Care [facility]) - Progression Note    Patient Details  Name: [name] Juarez MRN: [id number] Date of Birth: [birth year]  Transition of Care [facility]) CM/SW Contact  Lennart Pall, LCSW Phone Number: 01/01/2022, 1:54 PM  Clinical Narrative:    Have reveiwed SNF bed offers with pt/ wife and they have accepted bed at [facility].  Insurance Auth in process and hope to be able to admit tomorrow if medically cleared.  ADDENDUM: Have received insurance Josem Kaufmann [phone] # [phone]) for [facility] admission tomorrow   Expected Discharge Plan: [place] Barriers to Discharge: Continued Medical Work up, Ship broker, SNF Pending bed offer  Expected Discharge Plan and Services Expected Discharge Plan: [place] In-house Referral: Clinical Social Work     Living arrangements for the past 2 months: Single Family Home                 DME Arranged: N/A DME Agency: NA                   Social Determinants of Health (SDOH) Interventions    Readmission Risk Interventions     No data to display

## 2022-01-01 NOTE — Progress Notes (Signed)
Physical Therapy Treatment Patient Details Name: [name] Juarez MRN: [id number] DOB: 19-Nov-1945 Today's Date: 01/01/2022   History of Present Illness Patient is 76 year old male admitted for acute renal failure. Patient PMH: moderate dementia with associated possible parkinsonian features, hypertension, hyperlipidemia, CKD stage IIIb, PAD, pre-DM, COPD, right frontal CVA, shingles    PT Comments    Pt required +2 max assist for sit to stand, he had a significant posterior lean upon standing requiring +2 mod assist. Increased time to initiate movement.  Pt ambulated 15' + 6' with a B platform RW (EVA walker) with max verbal cues to increase step length and to correct flexed trunk, pt has narrow base of support. Pt presents with typical Parkinsonian-type mobility.    Recommendations for follow up therapy are one component of a multi-disciplinary discharge planning process, led by the attending physician.  Recommendations may be updated based on patient status, additional functional criteria and insurance authorization.  Follow Up Recommendations  Skilled nursing-short term rehab (<3 hours/day) Can patient physically be transported by private vehicle: No   Assistance Recommended at Discharge Frequent or constant Supervision/Assistance  Patient can return home with the following Two people to help with walking and/or transfers;Assistance with cooking/housework;Assist for transportation;Help with stairs or ramp for entrance;Direct supervision/assist for medications management;Direct supervision/assist for financial management;A lot of help with bathing/dressing/bathroom   Equipment Recommendations  None recommended by PT (TBD @ SNF)    Recommendations for Other Services       Precautions / Restrictions Precautions Precautions: Fall Precaution Comments: posterior lean in standing, high fall risk Restrictions Weight Bearing Restrictions: No     Mobility  Bed Mobility                General bed mobility comments: up in recliner    Transfers Overall transfer level: Needs assistance Equipment used: Bilateral platform walker Transfers: Sit to/from Stand Sit to Stand: +2 safety/equipment, Max assist           General transfer comment: sit to stand from recliner, max verbal/manual cues for UE/LE placement    Ambulation/Gait Ambulation/Gait assistance: Mod assist, +2 physical assistance, +2 safety/equipment Gait Distance (Feet): 15 Feet Assistive device: Ethelene Hal (EVA walker/bil cushioned platform walker) Gait Pattern/deviations: Step-to pattern, Decreased step length - right, Decreased step length - left, Trunk flexed, Shuffle       General Gait Details: multi-modal cues for incr step length, trunk and hip extension. incr time needed to advance LEs. flexed posture, shuffling gait. 15' + 6' with seated rest. Distance limited by fatigue   Stairs             Wheelchair Mobility    Modified Rankin (Stroke Patients Only)       Balance Overall balance assessment: Needs assistance Sitting-balance support: Single extremity supported, No upper extremity supported, Feet supported Sitting balance-Leahy Scale: Poor Sitting balance - Comments: close supervision, briefly able to maintain static sit; posterior LOB,cues and assist to correct Postural control: Posterior lean Standing balance support: During functional activity, Bilateral upper extremity supported, Reliant on assistive device for balance Standing balance-Leahy Scale: Poor Standing balance comment: reliant on device and external assist                            Cognition Arousal/Alertness: Awake/alert Behavior During Therapy: WFL for tasks assessed/performed Overall Cognitive Status: History of cognitive impairments - at baseline  General Comments: wife present during session        Exercises      General Comments         Pertinent Vitals/Pain Pain Assessment Pain Score: 0-No pain Faces Pain Scale: No hurt    Home Living                          Prior Function            PT Goals (current goals can now be found in the care plan section) Acute Rehab PT Goals Patient Stated Goal: to regain PLOF/independence PT Goal Formulation: With patient/family Time For Goal Achievement: 01/10/22 Potential to Achieve Goals: Good Progress towards PT goals: Progressing toward goals    Frequency    Min 3X/week      PT Plan Current plan remains appropriate    Co-evaluation              AM-PAC PT "6 Clicks" Mobility   Outcome Measure  Help needed turning from your back to your side while in a flat bed without using bedrails?: A Lot Help needed moving from lying on your back to sitting on the side of a flat bed without using bedrails?: A Lot Help needed moving to and from a bed to a chair (including a wheelchair)?: Total Help needed standing up from a chair using your arms (e.g., wheelchair or bedside chair)?: Total Help needed to walk in hospital room?: Total Help needed climbing 3-5 steps with a railing? : Total 6 Click Score: 8    End of Session Equipment Utilized During Treatment: Gait belt Activity Tolerance: Patient tolerated treatment well Patient left: in chair;with chair alarm set;with call bell/phone within reach;with family/visitor present Nurse Communication: Mobility status PT Visit Diagnosis: Muscle weakness (generalized) (M62.81);Difficulty in walking, not elsewhere classified (R26.2);Other symptoms and signs involving the nervous system (R29.898)     Time: 8185-6314 PT Time Calculation (min) (ACUTE ONLY): 24 min  Charges:  $Gait Training: 8-22 mins $Therapeutic Activity: 8-22 mins                     Blondell Reveal Kistler PT 01/01/2022  Acute Rehabilitation Services  Office [phone]

## 2022-01-01 NOTE — Plan of Care (Signed)
°  Problem: Education: °Goal: Knowledge of General Education information will improve °Description: Including pain rating scale, medication(s)/side effects and non-pharmacologic comfort measures °Outcome: Progressing °  °Problem: Clinical Measurements: °Goal: Ability to maintain clinical measurements within normal limits will improve °Outcome: Progressing °  °Problem: Elimination: °Goal: Will not experience complications related to urinary retention °Outcome: Progressing °  °Problem: Pain Managment: °Goal: General experience of comfort will improve °Outcome: Progressing °  °Problem: Safety: °Goal: Ability to remain free from injury will improve °Outcome: Progressing °  °

## 2022-01-02 DIAGNOSIS — R1312 Dysphagia, oropharyngeal phase: Secondary | ICD-10-CM | POA: Diagnosis not present

## 2022-01-02 DIAGNOSIS — I739 Peripheral vascular disease, unspecified: Secondary | ICD-10-CM | POA: Diagnosis not present

## 2022-01-02 DIAGNOSIS — Z8673 Personal history of transient ischemic attack (TIA), and cerebral infarction without residual deficits: Secondary | ICD-10-CM | POA: Diagnosis not present

## 2022-01-02 DIAGNOSIS — G20C Parkinsonism, unspecified: Secondary | ICD-10-CM | POA: Diagnosis not present

## 2022-01-02 DIAGNOSIS — R262 Difficulty in walking, not elsewhere classified: Secondary | ICD-10-CM | POA: Diagnosis not present

## 2022-01-02 DIAGNOSIS — R531 Weakness: Secondary | ICD-10-CM | POA: Diagnosis not present

## 2022-01-02 DIAGNOSIS — I10 Essential (primary) hypertension: Secondary | ICD-10-CM | POA: Diagnosis not present

## 2022-01-02 DIAGNOSIS — R260 Ataxic gait: Secondary | ICD-10-CM | POA: Diagnosis not present

## 2022-01-02 DIAGNOSIS — R5381 Other malaise: Secondary | ICD-10-CM | POA: Diagnosis not present

## 2022-01-02 DIAGNOSIS — N189 Chronic kidney disease, unspecified: Secondary | ICD-10-CM | POA: Diagnosis not present

## 2022-01-02 DIAGNOSIS — G20B1 Parkinson's disease with dyskinesia, without mention of fluctuations: Secondary | ICD-10-CM | POA: Diagnosis not present

## 2022-01-02 DIAGNOSIS — N179 Acute kidney failure, unspecified: Secondary | ICD-10-CM | POA: Diagnosis not present

## 2022-01-02 DIAGNOSIS — B028 Zoster with other complications: Secondary | ICD-10-CM | POA: Diagnosis not present

## 2022-01-02 DIAGNOSIS — R293 Abnormal posture: Secondary | ICD-10-CM | POA: Diagnosis not present

## 2022-01-02 DIAGNOSIS — N1832 Chronic kidney disease, stage 3b: Secondary | ICD-10-CM | POA: Diagnosis not present

## 2022-01-02 DIAGNOSIS — J449 Chronic obstructive pulmonary disease, unspecified: Secondary | ICD-10-CM | POA: Diagnosis not present

## 2022-01-02 DIAGNOSIS — M6281 Muscle weakness (generalized): Secondary | ICD-10-CM | POA: Diagnosis not present

## 2022-01-02 DIAGNOSIS — R269 Unspecified abnormalities of gait and mobility: Secondary | ICD-10-CM | POA: Diagnosis not present

## 2022-01-02 DIAGNOSIS — Z743 Need for continuous supervision: Secondary | ICD-10-CM | POA: Diagnosis not present

## 2022-01-02 DIAGNOSIS — B029 Zoster without complications: Secondary | ICD-10-CM | POA: Diagnosis not present

## 2022-01-02 DIAGNOSIS — I83893 Varicose veins of bilateral lower extremities with other complications: Secondary | ICD-10-CM | POA: Diagnosis not present

## 2022-01-02 LAB — CREATININE, SERUM
Creatinine, Ser: 1.3 mg/dL — ABNORMAL HIGH (ref 0.61–1.24)
GFR, Estimated: 57 mL/min — ABNORMAL LOW (ref >60)

## 2022-01-02 NOTE — Progress Notes (Signed)
Report provided to [place] at [facility]; reported provided; Caryl Pina verbalized understanding with no further questions or concerns; patient okay to transfer once PTAR arrives.

## 2022-01-02 NOTE — Discharge Summary (Signed)
Physician Discharge Summary  Adma Gaertner [id number] DOB: [birth year] DOA: 12/26/2021  PCP: Shirline Frees, MD  Admit date: 12/26/2021 Discharge date: 01/02/2022  Admitted From: home  Disposition:  SNF   Recommendations for Outpatient Follow-up:  Follow up with PCP in 1-2 weeks after discharge  Please obtain BMP/CBC in one week Follow-up with neurology as already scheduled  Home Health: N/A Equipment/Devices: N/A  Discharge Condition: Fair CODE STATUS: Full code Diet recommendation: Regular diet  Discharge summary: 76 year old gentleman with history of mild dementia and Parkinson's, hypertension, hyperlipidemia, CKD stage IIIb, peripheral artery disease, COPD and history of stroke presented on 9/23 with generalized weakness, inability to walk.  He was recently diagnosed with shingles on his left anterior groin and thigh.  He was found to have AKI and poor oral intake and was admitted to the hospital.     Assessment & Plan:   Acute kidney injury with history of chronic kidney disease stage IIIb, recent known baseline creatinine 1.6. Creatinine is 1.3 today. Patient treated with IV fluids, currently oral intake is improving.  Appetite is improving.  Encourage oral intake. Creatinine is stabilizing. Will need to recheck in 1 week.   Moderate dehydration: Secondary to poor oral intake.  Encourage oral diet and fluid.  Improving.   Generalized weakness and physical deconditioning: Underlying movement disorder and neurological disorders aggravated by outpatient zoster infection.  Improving.  Patient will need inpatient therapy at [facility] versus SNF. Patient does follow-up with neurology as outpatient.   Herpes zoster : L1-L2 dermatome.  Completed Valtrex for 10 days.  Lesions are crusted.  No indication for airborne isolation.   Stable to transfer to SNF for rehab.   Discharge Diagnoses:  Principal Problem:   Acute kidney injury superimposed on chronic kidney disease [facility]) Active  Problems:   Hypertension   Shingles    Discharge Instructions  Discharge Instructions     Diet general   Complete by: As directed    Increase activity slowly   Complete by: As directed       Allergies as of 01/02/2022   No Known Allergies      Medication List     STOP taking these medications    valACYclovir 1000 MG tablet Commonly known as: VALTREX       TAKE these medications    acetaminophen 500 MG tablet Commonly known as: TYLENOL Take 2 tablets (1,000 mg total) by mouth every 6 (six) hours as needed for mild pain.   carbidopa-levodopa 25-100 MG tablet Commonly known as: SINEMET IR Take half a pill twice daily (8 AM and noon) for one week, then half a pill 3 times a day (8 AM, noon, and 4 PM) for one week, then one pill 3 times a day What changed:  how much to take how to take this when to take this additional instructions   fexofenadine 180 MG tablet Commonly known as: ALLEGRA Take 180 mg by mouth daily as needed for allergies or rhinitis.   ipratropium 0.03 % nasal spray Commonly known as: ATROVENT Place 2 sprays into both nostrils every 12 (twelve) hours.   memantine 10 MG tablet Commonly known as: NAMENDA Take 10 mg by mouth 2 (two) times daily.   PROBIOTIC PO Take 1 capsule by mouth daily.        Contact information for after-discharge care     Destination     [facility] Preferred SNF .   Service: Skilled Nursing Contact information: [place] [phone]  [phone]                    No Known Allergies  Consultations: None   Procedures/Studies: DG Ankle Complete Right  Result Date: 12/29/2021 CLINICAL DATA:  Pain. EXAM: RIGHT ANKLE - COMPLETE 3+ VIEW; RIGHT FOOT COMPLETE - 3+ VIEW COMPARISON:  None Available. FINDINGS: Ankle: There is no acute fracture or dislocation. Bony alignment is normal. The ankle mortise is intact. The soft tissues are unremarkable. Foot: There is no  acute fracture or dislocation. There is mild hallux valgus. Alignment is otherwise normal. The Lisfranc and Chopart joints are intact. The soft tissues are unremarkable. IMPRESSION: No evidence of acute injury in the ankle or foot. Electronically Signed   By: Valetta Mole M.D.   On: 12/29/2021 15:27   DG Foot Complete Right  Result Date: 12/29/2021 CLINICAL DATA:  Pain. EXAM: RIGHT ANKLE - COMPLETE 3+ VIEW; RIGHT FOOT COMPLETE - 3+ VIEW COMPARISON:  None Available. FINDINGS: Ankle: There is no acute fracture or dislocation. Bony alignment is normal. The ankle mortise is intact. The soft tissues are unremarkable. Foot: There is no acute fracture or dislocation. There is mild hallux valgus. Alignment is otherwise normal. The Lisfranc and Chopart joints are intact. The soft tissues are unremarkable. IMPRESSION: No evidence of acute injury in the ankle or foot. Electronically Signed   By: Valetta Mole M.D.   On: 12/29/2021 15:27   DG CHEST PORT 1 VIEW  Result Date: 12/28/2021 CLINICAL DATA:  Cough. EXAM: PORTABLE CHEST 1 VIEW COMPARISON:  November 14, 2019. FINDINGS: The heart size and mediastinal contours are within normal limits. Both lungs are clear. The visualized skeletal structures are unremarkable. IMPRESSION: No active disease. Electronically Signed   By: Marijo Conception M.D.   On: 12/28/2021 10:39   (Echo, Carotid, EGD, Colonoscopy, ERCP)    Subjective: Patient seen and examined.  No overnight events.  Wife at the bedside.  Finished his meals.  Urinating normally.  Groin lesions do not hurt.   Discharge Exam: Vitals:   01/01/22 2234 01/02/22 0626  BP: 123/79 117/78  Pulse: 98 93  Resp: 14 14  Temp: 98.1 F (36.7 C) 97.8 F (36.6 C)  SpO2: 95% 91%   Vitals:   01/01/22 0632 01/01/22 1346 01/01/22 2234 01/02/22 0626  BP: 130/73 120/88 123/79 117/78  Pulse: 90 60 98 93  Resp: 18 16 14 14   Temp: 98.5 F (36.9 C) 98.6 F (37 C) 98.1 F (36.7 C) 97.8 F (36.6 C)  TempSrc: Oral  Oral  Oral  SpO2: 94% 95% 95% 91%  Weight:      Height:        General: Pt is alert, awake, not in acute distress Frail and debilitated.  Alert oriented x3-4.  In good mood today. Cardiovascular: RRR, S1/S2 +, no rubs, no gallops Respiratory: CTA bilaterally, no wheezing, no rhonchi Abdominal: Soft, NT, ND, bowel sounds +, patient does have some herpetic lesions on the anterior left thigh that are drying with no secretions. Extremities: no edema, no cyanosis    The results of significant diagnostics from this hospitalization (including imaging, microbiology, ancillary and laboratory) are listed below for reference.     Microbiology: Recent Results (from the past 240 hour(s))  Culture, blood (Routine X 2) w Reflex to ID Panel     Status: None (Preliminary result)   Collection Time: 12/29/21  2:40 PM   Specimen: BLOOD  Result Value Ref Range Status   Specimen Description  Final    BLOOD BLOOD RIGHT HAND Performed at [facility] [address], [place], [place] [address]    Special Requests   Final    BOTTLES DRAWN AEROBIC ONLY Blood Culture adequate volume Performed at [facility] [address], [place], [place] [address]    Culture   Final    NO GROWTH 3 DAYS Performed at [facility], [place] [address], [place], [place] [address]    Report Status PENDING  Incomplete  Culture, blood (Routine X 2) w Reflex to ID Panel     Status: None (Preliminary result)   Collection Time: 12/29/21  2:40 PM   Specimen: BLOOD  Result Value Ref Range Status   Specimen Description   Final    BLOOD BLOOD RIGHT ARM Performed at [facility] [address], [place], [place] [address]    Special Requests   Final    BOTTLES DRAWN AEROBIC ONLY Blood Culture adequate volume Performed at [facility] [address], [place], [place] [address]    Culture   Final    NO GROWTH 3 DAYS Performed at [facility], [place] [address], [place], [place] [address]    Report Status PENDING  Incomplete  Respiratory (~20 pathogens) panel by PCR     Status: None   Collection Time: 12/29/21  3:00 PM   Specimen: Nasopharyngeal Swab; Respiratory  Result Value Ref Range Status   Adenovirus NOT DETECTED NOT DETECTED Final   Coronavirus 229E NOT DETECTED NOT DETECTED Final    Comment: (NOTE) The Coronavirus on the Respiratory Panel, DOES NOT test for the novel  Coronavirus (2019 nCoV)    Coronavirus HKU1 NOT DETECTED NOT DETECTED Final   Coronavirus NL63 NOT DETECTED NOT DETECTED Final   Coronavirus OC43 NOT DETECTED NOT DETECTED Final   Metapneumovirus NOT DETECTED NOT DETECTED Final   Rhinovirus / Enterovirus NOT DETECTED NOT DETECTED Final   Influenza A NOT DETECTED NOT DETECTED Final   Influenza B NOT DETECTED NOT DETECTED Final   Parainfluenza Virus 1 NOT DETECTED NOT DETECTED Final   Parainfluenza Virus 2 NOT DETECTED NOT DETECTED Final   Parainfluenza Virus 3 NOT DETECTED NOT DETECTED Final   Parainfluenza Virus 4 NOT DETECTED NOT DETECTED Final   Respiratory Syncytial Virus NOT DETECTED NOT DETECTED Final   Bordetella pertussis NOT DETECTED NOT DETECTED Final   Bordetella Parapertussis NOT DETECTED NOT DETECTED Final   Chlamydophila pneumoniae NOT DETECTED NOT DETECTED Final   Mycoplasma pneumoniae NOT DETECTED NOT DETECTED Final    Comment: Performed at [facility], [place], [place], [place] [address]  SARS Coronavirus 2 by RT PCR (hospital order, performed in [facility] hospital lab) *cepheid single result test* Anterior Nasal Swab     Status: None   Collection Time: 12/29/21  3:00 PM   Specimen: Anterior Nasal Swab  Result Value Ref Range Status   SARS Coronavirus 2 by RT PCR NEGATIVE NEGATIVE Final    Comment: (NOTE) SARS-CoV-2 target nucleic acids are NOT DETECTED.  The SARS-CoV-2 RNA is generally detectable in upper and lower respiratory specimens during the acute phase of  infection. The lowest concentration of SARS-CoV-2 viral copies this assay can detect is 250 copies / mL. A negative result does not preclude SARS-CoV-2 infection and should not be used as the sole basis for treatment or other patient management decisions.  A negative result may occur with improper specimen collection /  handling, submission of specimen other than nasopharyngeal swab, presence of viral mutation(s) within the areas targeted by this assay, and inadequate number of viral copies (<250 copies / mL). A negative result must be combined with clinical observations, patient history, and epidemiological information.  Fact Sheet for Patients:   https://www.patel.info/  Fact Sheet for Healthcare Providers: https://hall.com/  This test is not yet approved or  cleared by the [place] FDA and has been authorized for detection and/or diagnosis of SARS-CoV-2 by FDA under an Emergency Use Authorization (EUA).  This EUA will remain in effect (meaning this test can be used) for the duration of the COVID-19 declaration under Section 564(b)(1) of the Act, 21 U.S.C. section 360bbb-3(b)(1), unless the authorization is terminated or revoked sooner.  Performed at [facility], [place] [address], [place], [place] [address]      Labs: BNP (last 3 results) No results for input(s): "BNP" in the last 8760 hours. Basic Metabolic Panel: Recent Labs  Lab 12/26/21 1317 12/27/21 0633 12/28/21 0331 12/29/21 0402 12/30/21 0344 01/02/22 0426  NA 141 139 138 138 138  --   K 4.3 3.8 3.8 4.3 4.0  --   CL 113* 110 110 109 110  --   CO2 22 21* 20* 22 21*  --   GLUCOSE 122* 135* 158* 147* 155*  --   BUN 35* 36* 40* 42* 38*  --   CREATININE 1.92* 1.65* 1.75* 1.95* 1.74* 1.30*  CALCIUM 9.1 8.4* 8.3* 8.6* 8.4*  --    Liver Function Tests: Recent Labs  Lab 12/26/21 1317  AST 19  ALT 17  ALKPHOS 37*  BILITOT 0.9  PROT 7.4  ALBUMIN  3.8   No results for input(s): "LIPASE", "AMYLASE" in the last 168 hours. No results for input(s): "AMMONIA" in the last 168 hours. CBC: Recent Labs  Lab 12/26/21 1317 12/27/21 0633 12/28/21 0331 12/29/21 1440  WBC 7.5 8.4 10.6* 9.3  NEUTROABS 5.6  --   --  7.6  HGB 11.8* 10.4* 10.9* 12.1*  HCT 37.8* 32.7* 34.1* 39.4  MCV 95.7 95.1 94.5 98.0  PLT 183 153 158 183   Cardiac Enzymes: No results for input(s): "CKTOTAL", "CKMB", "CKMBINDEX", "TROPONINI" in the last 168 hours. BNP: Invalid input(s): "POCBNP" CBG: No results for input(s): "GLUCAP" in the last 168 hours. D-Dimer No results for input(s): "DDIMER" in the last 72 hours. Hgb A1c No results for input(s): "HGBA1C" in the last 72 hours. Lipid Profile No results for input(s): "CHOL", "HDL", "LDLCALC", "TRIG", "CHOLHDL", "LDLDIRECT" in the last 72 hours. Thyroid function studies No results for input(s): "TSH", "T4TOTAL", "T3FREE", "THYROIDAB" in the last 72 hours.  Invalid input(s): "FREET3" Anemia work up No results for input(s): "VITAMINB12", "FOLATE", "FERRITIN", "TIBC", "IRON", "RETICCTPCT" in the last 72 hours. Urinalysis    Component Value Date/Time   COLORURINE YELLOW 12/26/2021 1505   APPEARANCEUR CLEAR 12/26/2021 1505   LABSPEC 1.020 12/26/2021 1505   PHURINE 5.0 12/26/2021 1505   GLUCOSEU NEGATIVE 12/26/2021 1505   HGBUR NEGATIVE 12/26/2021 1505   BILIRUBINUR NEGATIVE 12/26/2021 1505   KETONESUR NEGATIVE 12/26/2021 1505   PROTEINUR 30 (A) 12/26/2021 1505   UROBILINOGEN 0.2 11/01/2011 1522   NITRITE NEGATIVE 12/26/2021 1505   LEUKOCYTESUR NEGATIVE 12/26/2021 1505   Sepsis Labs Recent Labs  Lab 12/26/21 1317 12/27/21 0633 12/28/21 0331 12/29/21 1440  WBC 7.5 8.4 10.6* 9.3   Microbiology Recent Results (from the past 240 hour(s))  Culture, blood (Routine X 2) w Reflex to ID Panel  Status: None (Preliminary result)   Collection Time: 12/29/21  2:40 PM   Specimen: BLOOD  Result Value Ref Range  Status   Specimen Description   Final    BLOOD BLOOD RIGHT HAND Performed at [facility], [address] [place], [place] [address]    Special Requests   Final    BOTTLES DRAWN AEROBIC ONLY Blood Culture adequate volume Performed at [facility], [address], [place], [place] [address]    Culture   Final    NO GROWTH 3 DAYS Performed at [facility], [address], [place], [place] [address]    Report Status PENDING  Incomplete  Culture, blood (Routine X 2) w Reflex to ID Panel     Status: None (Preliminary result)   Collection Time: 12/29/21  2:40 PM   Specimen: BLOOD  Result Value Ref Range Status   Specimen Description   Final    BLOOD BLOOD RIGHT ARM Performed at [facility], [address] [place], [place] [address]    Special Requests   Final    BOTTLES DRAWN AEROBIC ONLY Blood Culture adequate volume Performed at [facility], [address], [place], [place] [address]    Culture   Final    NO GROWTH 3 DAYS Performed at [facility], [address] [place], [place] [address]    Report Status PENDING  Incomplete  Respiratory (~20 pathogens) panel by PCR     Status: None   Collection Time: 12/29/21  3:00 PM   Specimen: Nasopharyngeal Swab; Respiratory  Result Value Ref Range Status   Adenovirus NOT DETECTED NOT DETECTED Final   Coronavirus 229E NOT DETECTED NOT DETECTED Final    Comment: (NOTE) The Coronavirus on the Respiratory Panel, DOES NOT test for the novel  Coronavirus (2019 nCoV)    Coronavirus HKU1 NOT DETECTED NOT DETECTED Final   Coronavirus NL63 NOT DETECTED NOT DETECTED Final   Coronavirus OC43 NOT DETECTED NOT DETECTED Final   Metapneumovirus NOT DETECTED NOT DETECTED Final   Rhinovirus / Enterovirus NOT DETECTED NOT DETECTED Final   Influenza A NOT DETECTED NOT DETECTED Final   Influenza B NOT DETECTED NOT DETECTED Final   Parainfluenza Virus 1 NOT  DETECTED NOT DETECTED Final   Parainfluenza Virus 2 NOT DETECTED NOT DETECTED Final   Parainfluenza Virus 3 NOT DETECTED NOT DETECTED Final   Parainfluenza Virus 4 NOT DETECTED NOT DETECTED Final   Respiratory Syncytial Virus NOT DETECTED NOT DETECTED Final   Bordetella pertussis NOT DETECTED NOT DETECTED Final   Bordetella Parapertussis NOT DETECTED NOT DETECTED Final   Chlamydophila pneumoniae NOT DETECTED NOT DETECTED Final   Mycoplasma pneumoniae NOT DETECTED NOT DETECTED Final    Comment: Performed at [facility], [address] [place], [place] [address]  SARS Coronavirus 2 by RT PCR (hospital order, performed in [facility] hospital lab) *cepheid single result test* Anterior Nasal Swab     Status: None   Collection Time: 12/29/21  3:00 PM   Specimen: Anterior Nasal Swab  Result Value Ref Range Status   SARS Coronavirus 2 by RT PCR NEGATIVE NEGATIVE Final    Comment: (NOTE) SARS-CoV-2 target nucleic acids are NOT DETECTED.  The SARS-CoV-2 RNA is generally detectable in upper and lower respiratory specimens during the acute phase of infection. The lowest concentration of SARS-CoV-2 viral copies this assay can detect is 250 copies / mL. A negative result does not preclude SARS-CoV-2  infection and should not be used as the sole basis for treatment or other patient management decisions.  A negative result may occur with improper specimen collection / handling, submission of specimen other than nasopharyngeal swab, presence of viral mutation(s) within the areas targeted by this assay, and inadequate number of viral copies (<250 copies / mL). A negative result must be combined with clinical observations, patient history, and epidemiological information.  Fact Sheet for Patients:   https://www.patel.info/  Fact Sheet for Healthcare Providers: https://hall.com/  This test is not yet approved or  cleared by the [place] FDA  and has been authorized for detection and/or diagnosis of SARS-CoV-2 by FDA under an Emergency Use Authorization (EUA).  This EUA will remain in effect (meaning this test can be used) for the duration of the COVID-19 declaration under Section 564(b)(1) of the Act, 21 U.S.C. section 360bbb-3(b)(1), unless the authorization is terminated or revoked sooner.  Performed at [facility], [address], [place], [place] [address]      Time coordinating discharge: 35 minutes  SIGNED:   Barb Merino, MD  [facility] 01/02/2022, 7:43 AM

## 2022-01-02 NOTE — TOC Transition Note (Addendum)
Transition of Care [facility]) - CM/SW Discharge Note   Patient Details  Name: Castin Donaghue MRN: [id number] Date of Birth: 05-05-1945  Transition of Care [facility]) CM/SW Contact:  Henrietta Dine, RN Phone Number: 01/02/2022, 11:34 AM   Clinical Narrative:  Notified by [facility] at [facility] pt is going to RM# [id number] and room is ready; call report # [phone];  notified pt's wife Javonne Dorko [phone]) and she is agreeable with transfer; D/C summary and SNF Transfer Report sent via hub; [facility] contacted for pickup at 1153, spoke with Legrand Como; he was notified pt is on contact precautions for shingles; no further TOC needs.    Final next level of care: Skilled Nursing Facility Barriers to Discharge: Barriers Resolved, No Barriers Identified   Patient Goals and CMS Choice Patient states their goals for this hospitalization and ongoing recovery are:: daughter hopeful he may improve enough to dc home      Discharge Placement              Patient [facility] bed at: [facility] Patient to be transferred to facility by: [place] Name of family member notified: Reyn Faivre (spouse) [phone] Boyde Grieco (spouse) [phone]) Patient and family notified of of transfer: 01/02/22  Discharge Plan and Services In-house Referral: Clinical Social Work Discharge Planning Services: CM Consult Post Acute Care Choice: [place] ([place])          DME Arranged: N/A DME Agency: NA                  Social Determinants of Health (SDOH) Interventions     Readmission Risk Interventions     No data to display

## 2022-01-02 NOTE — Plan of Care (Signed)
  Problem: Education: Goal: Knowledge of General Education information will improve Description: Including pain rating scale, medication(s)/side effects and non-pharmacologic comfort measures Outcome: Progressing   Problem: Clinical Measurements: Goal: Ability to maintain clinical measurements within normal limits will improve Outcome: Progressing   Problem: Nutrition: Goal: Adequate nutrition will be maintained Outcome: Progressing   Problem: Elimination: Goal: Will not experience complications related to urinary retention Outcome: Progressing   Problem: Pain Managment: Goal: General experience of comfort will improve Outcome: Progressing   Problem: Safety: Goal: Ability to remain free from injury will improve Outcome: Progressing

## 2022-01-03 LAB — CULTURE, BLOOD (ROUTINE X 2)
Culture: NO GROWTH
Culture: NO GROWTH
Special Requests: ADEQUATE
Special Requests: ADEQUATE

## 2022-01-05 DIAGNOSIS — R5381 Other malaise: Secondary | ICD-10-CM | POA: Diagnosis not present

## 2022-01-05 DIAGNOSIS — J449 Chronic obstructive pulmonary disease, unspecified: Secondary | ICD-10-CM | POA: Diagnosis not present

## 2022-01-05 DIAGNOSIS — G20C Parkinsonism, unspecified: Secondary | ICD-10-CM | POA: Diagnosis not present

## 2022-01-05 DIAGNOSIS — B029 Zoster without complications: Secondary | ICD-10-CM | POA: Diagnosis not present

## 2022-01-05 DIAGNOSIS — N179 Acute kidney failure, unspecified: Secondary | ICD-10-CM | POA: Diagnosis not present

## 2022-01-05 DIAGNOSIS — B028 Zoster with other complications: Secondary | ICD-10-CM | POA: Diagnosis not present

## 2022-01-05 DIAGNOSIS — M6281 Muscle weakness (generalized): Secondary | ICD-10-CM | POA: Diagnosis not present

## 2022-01-05 DIAGNOSIS — R260 Ataxic gait: Secondary | ICD-10-CM | POA: Diagnosis not present

## 2022-01-05 DIAGNOSIS — R269 Unspecified abnormalities of gait and mobility: Secondary | ICD-10-CM | POA: Diagnosis not present

## 2022-01-05 DIAGNOSIS — G20B1 Parkinson's disease with dyskinesia, without mention of fluctuations: Secondary | ICD-10-CM | POA: Diagnosis not present

## 2022-01-05 DIAGNOSIS — I83893 Varicose veins of bilateral lower extremities with other complications: Secondary | ICD-10-CM | POA: Diagnosis not present

## 2022-01-05 DIAGNOSIS — I739 Peripheral vascular disease, unspecified: Secondary | ICD-10-CM | POA: Diagnosis not present

## 2022-01-05 DIAGNOSIS — Z8673 Personal history of transient ischemic attack (TIA), and cerebral infarction without residual deficits: Secondary | ICD-10-CM | POA: Diagnosis not present

## 2022-01-05 DIAGNOSIS — R262 Difficulty in walking, not elsewhere classified: Secondary | ICD-10-CM | POA: Diagnosis not present

## 2022-01-05 DIAGNOSIS — N1832 Chronic kidney disease, stage 3b: Secondary | ICD-10-CM | POA: Diagnosis not present

## 2022-01-07 ENCOUNTER — Other Ambulatory Visit: Payer: Self-pay | Admitting: *Deleted

## 2022-01-07 NOTE — Patient Outreach (Signed)
Mr. Reihl resides in [facility]. Screening for potential [facility] care coordination services as benefit of insurance plan and PCP.   Met with therapy manager and SNF social workers at [facility]. Mr. Martensen is from home with spouse. Plans to return.   [facility] care management services.   Will continue to follow.  Marthenia Rolling, MSN, RN,BSN [place] Acute Care Coordinator [phone] (Direct dial)

## 2022-01-08 DIAGNOSIS — R260 Ataxic gait: Secondary | ICD-10-CM | POA: Diagnosis not present

## 2022-01-08 DIAGNOSIS — I83893 Varicose veins of bilateral lower extremities with other complications: Secondary | ICD-10-CM | POA: Diagnosis not present

## 2022-01-08 DIAGNOSIS — G20C Parkinsonism, unspecified: Secondary | ICD-10-CM | POA: Diagnosis not present

## 2022-01-08 DIAGNOSIS — B029 Zoster without complications: Secondary | ICD-10-CM | POA: Diagnosis not present

## 2022-01-08 DIAGNOSIS — M6281 Muscle weakness (generalized): Secondary | ICD-10-CM | POA: Diagnosis not present

## 2022-01-08 DIAGNOSIS — Z8673 Personal history of transient ischemic attack (TIA), and cerebral infarction without residual deficits: Secondary | ICD-10-CM | POA: Diagnosis not present

## 2022-01-08 DIAGNOSIS — R262 Difficulty in walking, not elsewhere classified: Secondary | ICD-10-CM | POA: Diagnosis not present

## 2022-01-08 DIAGNOSIS — I10 Essential (primary) hypertension: Secondary | ICD-10-CM | POA: Diagnosis not present

## 2022-01-08 DIAGNOSIS — R5381 Other malaise: Secondary | ICD-10-CM | POA: Diagnosis not present

## 2022-01-11 DIAGNOSIS — I83893 Varicose veins of bilateral lower extremities with other complications: Secondary | ICD-10-CM | POA: Diagnosis not present

## 2022-01-11 DIAGNOSIS — R5381 Other malaise: Secondary | ICD-10-CM | POA: Diagnosis not present

## 2022-01-11 DIAGNOSIS — R260 Ataxic gait: Secondary | ICD-10-CM | POA: Diagnosis not present

## 2022-01-11 DIAGNOSIS — G20C Parkinsonism, unspecified: Secondary | ICD-10-CM | POA: Diagnosis not present

## 2022-01-11 DIAGNOSIS — R262 Difficulty in walking, not elsewhere classified: Secondary | ICD-10-CM | POA: Diagnosis not present

## 2022-01-11 DIAGNOSIS — I10 Essential (primary) hypertension: Secondary | ICD-10-CM | POA: Diagnosis not present

## 2022-01-11 DIAGNOSIS — Z8673 Personal history of transient ischemic attack (TIA), and cerebral infarction without residual deficits: Secondary | ICD-10-CM | POA: Diagnosis not present

## 2022-01-11 DIAGNOSIS — M6281 Muscle weakness (generalized): Secondary | ICD-10-CM | POA: Diagnosis not present

## 2022-01-11 DIAGNOSIS — B029 Zoster without complications: Secondary | ICD-10-CM | POA: Diagnosis not present

## 2022-01-13 DIAGNOSIS — R262 Difficulty in walking, not elsewhere classified: Secondary | ICD-10-CM | POA: Diagnosis not present

## 2022-01-13 DIAGNOSIS — I83893 Varicose veins of bilateral lower extremities with other complications: Secondary | ICD-10-CM | POA: Diagnosis not present

## 2022-01-13 DIAGNOSIS — R5381 Other malaise: Secondary | ICD-10-CM | POA: Diagnosis not present

## 2022-01-13 DIAGNOSIS — R260 Ataxic gait: Secondary | ICD-10-CM | POA: Diagnosis not present

## 2022-01-13 DIAGNOSIS — B029 Zoster without complications: Secondary | ICD-10-CM | POA: Diagnosis not present

## 2022-01-13 DIAGNOSIS — Z8673 Personal history of transient ischemic attack (TIA), and cerebral infarction without residual deficits: Secondary | ICD-10-CM | POA: Diagnosis not present

## 2022-01-13 DIAGNOSIS — G20C Parkinsonism, unspecified: Secondary | ICD-10-CM | POA: Diagnosis not present

## 2022-01-13 DIAGNOSIS — M6281 Muscle weakness (generalized): Secondary | ICD-10-CM | POA: Diagnosis not present

## 2022-01-18 DIAGNOSIS — I83893 Varicose veins of bilateral lower extremities with other complications: Secondary | ICD-10-CM | POA: Diagnosis not present

## 2022-01-18 DIAGNOSIS — Z8673 Personal history of transient ischemic attack (TIA), and cerebral infarction without residual deficits: Secondary | ICD-10-CM | POA: Diagnosis not present

## 2022-01-18 DIAGNOSIS — B029 Zoster without complications: Secondary | ICD-10-CM | POA: Diagnosis not present

## 2022-01-18 DIAGNOSIS — G20C Parkinsonism, unspecified: Secondary | ICD-10-CM | POA: Diagnosis not present

## 2022-01-18 DIAGNOSIS — M6281 Muscle weakness (generalized): Secondary | ICD-10-CM | POA: Diagnosis not present

## 2022-01-18 DIAGNOSIS — R5381 Other malaise: Secondary | ICD-10-CM | POA: Diagnosis not present

## 2022-01-18 DIAGNOSIS — R262 Difficulty in walking, not elsewhere classified: Secondary | ICD-10-CM | POA: Diagnosis not present

## 2022-01-18 DIAGNOSIS — R260 Ataxic gait: Secondary | ICD-10-CM | POA: Diagnosis not present

## 2022-01-20 ENCOUNTER — Other Ambulatory Visit: Payer: Self-pay | Admitting: *Deleted

## 2022-01-20 DIAGNOSIS — G20C Parkinsonism, unspecified: Secondary | ICD-10-CM | POA: Diagnosis not present

## 2022-01-20 DIAGNOSIS — M6281 Muscle weakness (generalized): Secondary | ICD-10-CM | POA: Diagnosis not present

## 2022-01-20 DIAGNOSIS — R5381 Other malaise: Secondary | ICD-10-CM | POA: Diagnosis not present

## 2022-01-20 DIAGNOSIS — I83893 Varicose veins of bilateral lower extremities with other complications: Secondary | ICD-10-CM | POA: Diagnosis not present

## 2022-01-20 DIAGNOSIS — B029 Zoster without complications: Secondary | ICD-10-CM | POA: Diagnosis not present

## 2022-01-20 DIAGNOSIS — Z8673 Personal history of transient ischemic attack (TIA), and cerebral infarction without residual deficits: Secondary | ICD-10-CM | POA: Diagnosis not present

## 2022-01-20 DIAGNOSIS — R262 Difficulty in walking, not elsewhere classified: Secondary | ICD-10-CM | POA: Diagnosis not present

## 2022-01-20 DIAGNOSIS — R260 Ataxic gait: Secondary | ICD-10-CM | POA: Diagnosis not present

## 2022-01-20 NOTE — Patient Outreach (Signed)
[place] Coordinator follow up. Mr. Wholey resides in [facility].   Update received from [place], [place], indicating Mr. Delay will transition home with spouse on Saturday. Will have home health.   Will plan to send secure notification to [facility] care management upon SNF discharge.   [facility] care [facility].  Marthenia Rolling, MSN, RN,BSN [place] Acute Care Coordinator [phone] (Direct dial)

## 2022-01-21 DIAGNOSIS — G20B1 Parkinson's disease with dyskinesia, without mention of fluctuations: Secondary | ICD-10-CM | POA: Diagnosis not present

## 2022-01-21 DIAGNOSIS — N1832 Chronic kidney disease, stage 3b: Secondary | ICD-10-CM | POA: Diagnosis not present

## 2022-01-21 DIAGNOSIS — N179 Acute kidney failure, unspecified: Secondary | ICD-10-CM | POA: Diagnosis not present

## 2022-01-21 DIAGNOSIS — R269 Unspecified abnormalities of gait and mobility: Secondary | ICD-10-CM | POA: Diagnosis not present

## 2022-01-21 DIAGNOSIS — J449 Chronic obstructive pulmonary disease, unspecified: Secondary | ICD-10-CM | POA: Diagnosis not present

## 2022-01-25 ENCOUNTER — Other Ambulatory Visit: Payer: Self-pay | Admitting: *Deleted

## 2022-01-25 NOTE — Patient Outreach (Addendum)
[name] Coordinator follow up. Verified in [facility] [name] Juarez discharged home on 01/22/22.  SNF social worker previously reported [name] Juarez was returning home with spouse and home health.   Secure message sent to SNF social worker to inquire about home health company.   [facility] care management. Secure notification sent to [facility] care management of discharge date.   AddendumIrine Seal SNF social worker reports [facility] home health was arranged.  Marthenia Rolling, MSN, RN,BSN [place] Acute Care Coordinator [phone] (Direct dial)

## 2022-01-26 DIAGNOSIS — I739 Peripheral vascular disease, unspecified: Secondary | ICD-10-CM | POA: Diagnosis not present

## 2022-01-26 DIAGNOSIS — J449 Chronic obstructive pulmonary disease, unspecified: Secondary | ICD-10-CM | POA: Diagnosis not present

## 2022-01-26 DIAGNOSIS — G20C Parkinsonism, unspecified: Secondary | ICD-10-CM | POA: Diagnosis not present

## 2022-01-26 DIAGNOSIS — N1832 Chronic kidney disease, stage 3b: Secondary | ICD-10-CM | POA: Diagnosis not present

## 2022-01-26 DIAGNOSIS — R269 Unspecified abnormalities of gait and mobility: Secondary | ICD-10-CM | POA: Diagnosis not present

## 2022-01-26 DIAGNOSIS — R531 Weakness: Secondary | ICD-10-CM | POA: Diagnosis not present

## 2022-01-26 DIAGNOSIS — Z8673 Personal history of transient ischemic attack (TIA), and cerebral infarction without residual deficits: Secondary | ICD-10-CM | POA: Diagnosis not present

## 2022-01-26 DIAGNOSIS — I1310 Hypertensive heart and chronic kidney disease without heart failure, with stage 1 through stage 4 chronic kidney disease, or unspecified chronic kidney disease: Secondary | ICD-10-CM | POA: Diagnosis not present

## 2022-01-27 DIAGNOSIS — Z8673 Personal history of transient ischemic attack (TIA), and cerebral infarction without residual deficits: Secondary | ICD-10-CM | POA: Diagnosis not present

## 2022-01-27 DIAGNOSIS — R269 Unspecified abnormalities of gait and mobility: Secondary | ICD-10-CM | POA: Diagnosis not present

## 2022-01-27 DIAGNOSIS — I739 Peripheral vascular disease, unspecified: Secondary | ICD-10-CM | POA: Diagnosis not present

## 2022-01-27 DIAGNOSIS — I1310 Hypertensive heart and chronic kidney disease without heart failure, with stage 1 through stage 4 chronic kidney disease, or unspecified chronic kidney disease: Secondary | ICD-10-CM | POA: Diagnosis not present

## 2022-01-27 DIAGNOSIS — N1832 Chronic kidney disease, stage 3b: Secondary | ICD-10-CM | POA: Diagnosis not present

## 2022-01-27 DIAGNOSIS — R531 Weakness: Secondary | ICD-10-CM | POA: Diagnosis not present

## 2022-01-27 DIAGNOSIS — G20C Parkinsonism, unspecified: Secondary | ICD-10-CM | POA: Diagnosis not present

## 2022-01-27 DIAGNOSIS — J449 Chronic obstructive pulmonary disease, unspecified: Secondary | ICD-10-CM | POA: Diagnosis not present

## 2022-01-28 DIAGNOSIS — I1310 Hypertensive heart and chronic kidney disease without heart failure, with stage 1 through stage 4 chronic kidney disease, or unspecified chronic kidney disease: Secondary | ICD-10-CM | POA: Diagnosis not present

## 2022-01-28 DIAGNOSIS — N1832 Chronic kidney disease, stage 3b: Secondary | ICD-10-CM | POA: Diagnosis not present

## 2022-01-28 DIAGNOSIS — Z8673 Personal history of transient ischemic attack (TIA), and cerebral infarction without residual deficits: Secondary | ICD-10-CM | POA: Diagnosis not present

## 2022-01-28 DIAGNOSIS — G20A2 Parkinson's disease without dyskinesia, with fluctuations: Secondary | ICD-10-CM | POA: Diagnosis not present

## 2022-01-28 DIAGNOSIS — N183 Chronic kidney disease, stage 3 unspecified: Secondary | ICD-10-CM | POA: Diagnosis not present

## 2022-01-28 DIAGNOSIS — J449 Chronic obstructive pulmonary disease, unspecified: Secondary | ICD-10-CM | POA: Diagnosis not present

## 2022-01-28 DIAGNOSIS — R413 Other amnesia: Secondary | ICD-10-CM | POA: Diagnosis not present

## 2022-01-28 DIAGNOSIS — I739 Peripheral vascular disease, unspecified: Secondary | ICD-10-CM | POA: Diagnosis not present

## 2022-01-28 DIAGNOSIS — R531 Weakness: Secondary | ICD-10-CM | POA: Diagnosis not present

## 2022-01-28 DIAGNOSIS — R269 Unspecified abnormalities of gait and mobility: Secondary | ICD-10-CM | POA: Diagnosis not present

## 2022-01-28 DIAGNOSIS — G20C Parkinsonism, unspecified: Secondary | ICD-10-CM | POA: Diagnosis not present

## 2022-01-28 DIAGNOSIS — B029 Zoster without complications: Secondary | ICD-10-CM | POA: Diagnosis not present

## 2022-02-01 DIAGNOSIS — G20C Parkinsonism, unspecified: Secondary | ICD-10-CM | POA: Diagnosis not present

## 2022-02-01 DIAGNOSIS — R269 Unspecified abnormalities of gait and mobility: Secondary | ICD-10-CM | POA: Diagnosis not present

## 2022-02-01 DIAGNOSIS — R531 Weakness: Secondary | ICD-10-CM | POA: Diagnosis not present

## 2022-02-01 DIAGNOSIS — I739 Peripheral vascular disease, unspecified: Secondary | ICD-10-CM | POA: Diagnosis not present

## 2022-02-01 DIAGNOSIS — J449 Chronic obstructive pulmonary disease, unspecified: Secondary | ICD-10-CM | POA: Diagnosis not present

## 2022-02-01 DIAGNOSIS — I1310 Hypertensive heart and chronic kidney disease without heart failure, with stage 1 through stage 4 chronic kidney disease, or unspecified chronic kidney disease: Secondary | ICD-10-CM | POA: Diagnosis not present

## 2022-02-01 DIAGNOSIS — N1832 Chronic kidney disease, stage 3b: Secondary | ICD-10-CM | POA: Diagnosis not present

## 2022-02-01 DIAGNOSIS — Z8673 Personal history of transient ischemic attack (TIA), and cerebral infarction without residual deficits: Secondary | ICD-10-CM | POA: Diagnosis not present

## 2022-02-03 DIAGNOSIS — N1832 Chronic kidney disease, stage 3b: Secondary | ICD-10-CM | POA: Diagnosis not present

## 2022-02-03 DIAGNOSIS — G20C Parkinsonism, unspecified: Secondary | ICD-10-CM | POA: Diagnosis not present

## 2022-02-03 DIAGNOSIS — Z8673 Personal history of transient ischemic attack (TIA), and cerebral infarction without residual deficits: Secondary | ICD-10-CM | POA: Diagnosis not present

## 2022-02-03 DIAGNOSIS — R531 Weakness: Secondary | ICD-10-CM | POA: Diagnosis not present

## 2022-02-03 DIAGNOSIS — R269 Unspecified abnormalities of gait and mobility: Secondary | ICD-10-CM | POA: Diagnosis not present

## 2022-02-03 DIAGNOSIS — I1310 Hypertensive heart and chronic kidney disease without heart failure, with stage 1 through stage 4 chronic kidney disease, or unspecified chronic kidney disease: Secondary | ICD-10-CM | POA: Diagnosis not present

## 2022-02-03 DIAGNOSIS — I739 Peripheral vascular disease, unspecified: Secondary | ICD-10-CM | POA: Diagnosis not present

## 2022-02-03 DIAGNOSIS — J449 Chronic obstructive pulmonary disease, unspecified: Secondary | ICD-10-CM | POA: Diagnosis not present

## 2022-02-04 DIAGNOSIS — Z8673 Personal history of transient ischemic attack (TIA), and cerebral infarction without residual deficits: Secondary | ICD-10-CM | POA: Diagnosis not present

## 2022-02-04 DIAGNOSIS — G20C Parkinsonism, unspecified: Secondary | ICD-10-CM | POA: Diagnosis not present

## 2022-02-04 DIAGNOSIS — R269 Unspecified abnormalities of gait and mobility: Secondary | ICD-10-CM | POA: Diagnosis not present

## 2022-02-04 DIAGNOSIS — R531 Weakness: Secondary | ICD-10-CM | POA: Diagnosis not present

## 2022-02-04 DIAGNOSIS — N1832 Chronic kidney disease, stage 3b: Secondary | ICD-10-CM | POA: Diagnosis not present

## 2022-02-04 DIAGNOSIS — G20A2 Parkinson's disease without dyskinesia, with fluctuations: Secondary | ICD-10-CM | POA: Diagnosis not present

## 2022-02-04 DIAGNOSIS — I1310 Hypertensive heart and chronic kidney disease without heart failure, with stage 1 through stage 4 chronic kidney disease, or unspecified chronic kidney disease: Secondary | ICD-10-CM | POA: Diagnosis not present

## 2022-02-04 DIAGNOSIS — J449 Chronic obstructive pulmonary disease, unspecified: Secondary | ICD-10-CM | POA: Diagnosis not present

## 2022-02-04 DIAGNOSIS — I739 Peripheral vascular disease, unspecified: Secondary | ICD-10-CM | POA: Diagnosis not present

## 2022-02-09 DIAGNOSIS — Z8673 Personal history of transient ischemic attack (TIA), and cerebral infarction without residual deficits: Secondary | ICD-10-CM | POA: Diagnosis not present

## 2022-02-09 DIAGNOSIS — R531 Weakness: Secondary | ICD-10-CM | POA: Diagnosis not present

## 2022-02-09 DIAGNOSIS — I739 Peripheral vascular disease, unspecified: Secondary | ICD-10-CM | POA: Diagnosis not present

## 2022-02-09 DIAGNOSIS — J449 Chronic obstructive pulmonary disease, unspecified: Secondary | ICD-10-CM | POA: Diagnosis not present

## 2022-02-09 DIAGNOSIS — N1832 Chronic kidney disease, stage 3b: Secondary | ICD-10-CM | POA: Diagnosis not present

## 2022-02-09 DIAGNOSIS — R269 Unspecified abnormalities of gait and mobility: Secondary | ICD-10-CM | POA: Diagnosis not present

## 2022-02-09 DIAGNOSIS — G20C Parkinsonism, unspecified: Secondary | ICD-10-CM | POA: Diagnosis not present

## 2022-02-09 DIAGNOSIS — I1310 Hypertensive heart and chronic kidney disease without heart failure, with stage 1 through stage 4 chronic kidney disease, or unspecified chronic kidney disease: Secondary | ICD-10-CM | POA: Diagnosis not present

## 2022-02-10 DIAGNOSIS — R269 Unspecified abnormalities of gait and mobility: Secondary | ICD-10-CM | POA: Diagnosis not present

## 2022-02-10 DIAGNOSIS — I1310 Hypertensive heart and chronic kidney disease without heart failure, with stage 1 through stage 4 chronic kidney disease, or unspecified chronic kidney disease: Secondary | ICD-10-CM | POA: Diagnosis not present

## 2022-02-10 DIAGNOSIS — G20C Parkinsonism, unspecified: Secondary | ICD-10-CM | POA: Diagnosis not present

## 2022-02-10 DIAGNOSIS — J449 Chronic obstructive pulmonary disease, unspecified: Secondary | ICD-10-CM | POA: Diagnosis not present

## 2022-02-10 DIAGNOSIS — Z8673 Personal history of transient ischemic attack (TIA), and cerebral infarction without residual deficits: Secondary | ICD-10-CM | POA: Diagnosis not present

## 2022-02-10 DIAGNOSIS — I739 Peripheral vascular disease, unspecified: Secondary | ICD-10-CM | POA: Diagnosis not present

## 2022-02-10 DIAGNOSIS — N1832 Chronic kidney disease, stage 3b: Secondary | ICD-10-CM | POA: Diagnosis not present

## 2022-02-10 DIAGNOSIS — R531 Weakness: Secondary | ICD-10-CM | POA: Diagnosis not present

## 2022-02-11 DIAGNOSIS — I1310 Hypertensive heart and chronic kidney disease without heart failure, with stage 1 through stage 4 chronic kidney disease, or unspecified chronic kidney disease: Secondary | ICD-10-CM | POA: Diagnosis not present

## 2022-02-11 DIAGNOSIS — J449 Chronic obstructive pulmonary disease, unspecified: Secondary | ICD-10-CM | POA: Diagnosis not present

## 2022-02-11 DIAGNOSIS — R269 Unspecified abnormalities of gait and mobility: Secondary | ICD-10-CM | POA: Diagnosis not present

## 2022-02-11 DIAGNOSIS — G20C Parkinsonism, unspecified: Secondary | ICD-10-CM | POA: Diagnosis not present

## 2022-02-11 DIAGNOSIS — Z8673 Personal history of transient ischemic attack (TIA), and cerebral infarction without residual deficits: Secondary | ICD-10-CM | POA: Diagnosis not present

## 2022-02-11 DIAGNOSIS — I739 Peripheral vascular disease, unspecified: Secondary | ICD-10-CM | POA: Diagnosis not present

## 2022-02-11 DIAGNOSIS — N1832 Chronic kidney disease, stage 3b: Secondary | ICD-10-CM | POA: Diagnosis not present

## 2022-02-11 DIAGNOSIS — R531 Weakness: Secondary | ICD-10-CM | POA: Diagnosis not present

## 2022-02-12 DIAGNOSIS — Z8673 Personal history of transient ischemic attack (TIA), and cerebral infarction without residual deficits: Secondary | ICD-10-CM | POA: Diagnosis not present

## 2022-02-12 DIAGNOSIS — I739 Peripheral vascular disease, unspecified: Secondary | ICD-10-CM | POA: Diagnosis not present

## 2022-02-12 DIAGNOSIS — G20C Parkinsonism, unspecified: Secondary | ICD-10-CM | POA: Diagnosis not present

## 2022-02-12 DIAGNOSIS — N1832 Chronic kidney disease, stage 3b: Secondary | ICD-10-CM | POA: Diagnosis not present

## 2022-02-12 DIAGNOSIS — I1310 Hypertensive heart and chronic kidney disease without heart failure, with stage 1 through stage 4 chronic kidney disease, or unspecified chronic kidney disease: Secondary | ICD-10-CM | POA: Diagnosis not present

## 2022-02-12 DIAGNOSIS — J449 Chronic obstructive pulmonary disease, unspecified: Secondary | ICD-10-CM | POA: Diagnosis not present

## 2022-02-12 DIAGNOSIS — R531 Weakness: Secondary | ICD-10-CM | POA: Diagnosis not present

## 2022-02-12 DIAGNOSIS — R269 Unspecified abnormalities of gait and mobility: Secondary | ICD-10-CM | POA: Diagnosis not present

## 2022-02-16 DIAGNOSIS — N1832 Chronic kidney disease, stage 3b: Secondary | ICD-10-CM | POA: Diagnosis not present

## 2022-02-16 DIAGNOSIS — J449 Chronic obstructive pulmonary disease, unspecified: Secondary | ICD-10-CM | POA: Diagnosis not present

## 2022-02-16 DIAGNOSIS — I1310 Hypertensive heart and chronic kidney disease without heart failure, with stage 1 through stage 4 chronic kidney disease, or unspecified chronic kidney disease: Secondary | ICD-10-CM | POA: Diagnosis not present

## 2022-02-16 DIAGNOSIS — G20C Parkinsonism, unspecified: Secondary | ICD-10-CM | POA: Diagnosis not present

## 2022-02-16 DIAGNOSIS — R531 Weakness: Secondary | ICD-10-CM | POA: Diagnosis not present

## 2022-02-16 DIAGNOSIS — I739 Peripheral vascular disease, unspecified: Secondary | ICD-10-CM | POA: Diagnosis not present

## 2022-02-16 DIAGNOSIS — R269 Unspecified abnormalities of gait and mobility: Secondary | ICD-10-CM | POA: Diagnosis not present

## 2022-02-16 DIAGNOSIS — Z8673 Personal history of transient ischemic attack (TIA), and cerebral infarction without residual deficits: Secondary | ICD-10-CM | POA: Diagnosis not present

## 2022-02-17 ENCOUNTER — Ambulatory Visit: Payer: Medicare Other | Admitting: Psychiatry

## 2022-02-17 VITALS — BP 122/70 | HR 64 | Ht 68.0 in | Wt 148.0 lb

## 2022-02-17 DIAGNOSIS — F03B0 Unspecified dementia, moderate, without behavioral disturbance, psychotic disturbance, mood disturbance, and anxiety: Secondary | ICD-10-CM | POA: Diagnosis not present

## 2022-02-17 DIAGNOSIS — G20C Parkinsonism, unspecified: Secondary | ICD-10-CM | POA: Diagnosis not present

## 2022-02-17 MED ORDER — DONEPEZIL HCL 10 MG PO TABS: 10.0000 mg | ORAL_TABLET | Freq: Every day | ORAL | 6 refills | Status: DC

## 2022-02-17 MED ORDER — CARBIDOPA-LEVODOPA 25-100 MG PO TABS: 1.0000 | ORAL_TABLET | Freq: Three times a day (TID) | ORAL | 3 refills | Status: DC

## 2022-02-17 MED ORDER — DONEPEZIL HCL 5 MG PO TABS: 5.0000 mg | ORAL_TABLET | Freq: Every day | ORAL | 0 refills | Status: DC

## 2022-02-17 NOTE — Progress Notes (Signed)
   CC:  dementia  Follow-up Visit  Last visit: 08/17/21  Brief HPI: 76 year old male with a history of abdominal aneurysm, HTN, prediabetes, HLD, COPD, PAD, right frontal CVA who follows in clinic for dementia and imbalance. MRI brain in 2020 with generalized brain atrophy and a remote right frontal stroke.  At his last visit he was continued on Namenda 10 mg BID. He was started on Sinemet trial for tremor and bradykinesia.  Interval History: He was hospitalized in September for generalized weakness and confusion. He was found to have an AKI at that time. Also was diagnosed with shingles around that time.   Memory is about the same as his last visit. He would like to try adding donepezil again to see if the combination with Namenda is helpful. He just finished PT yesterday for his gait. Walking has improved and he is shuffling less than previously. He is currently walking with a cane. Tremor and bradykinesia have also improved.  Physical Exam:   Vital Signs: BP 122/70   Pulse 64   Ht 5\' 8"  (1.727 m)   Wt 148 lb (67.1 kg)   BMI 22.50 kg/m  GENERAL:  well appearing, in no acute distress, alert  SKIN:  Color, texture, turgor normal. No rashes or lesions HEAD:  Normocephalic/atraumatic. RESP: normal respiratory effort MSK:  No gross joint deformities.   NEUROLOGICAL: Mental Status: Alert, oriented to person, place and time, Follows commands, and Speech fluent and appropriate. Cranial Nerves: PERRL, face symmetric, no dysarthria, hearing grossly intact Motor: moves all extremities equally. No dysmetria on finger-nose-finger. No cogwheeling. No resting tremor observed. Mild bradykinesia on right finger-tapping, no bradykinesia on toe-tapping Gait: Walks with a cane. Narrow-based gait. Stride length decreased but improved from last visit. +En bloc turning  IMPRESSION: 76 year old male with a history of abdominal aneurysm, HTN, prediabetes, HLD, COPD, PAD, right frontal CVA who  presents for follow up of dementia and gait imbalance. Memory is stable from his last visit. Will add donepezil and see if he benefits from the combination of donepezil and Namenda. His tremor, gait, and bradykinesia have improved with Sinemet, which supports a diagnosis of Parkinson's disease. He is happy with his functioning at current dose of Sinemet, will continue on current dose for now.  PLAN: -Start donepezil 5 mg daily x 4 weeks, then increase to 10 mg daily. Continue Namenda 10 mg BID -Continue Sinement 25-100 1 pill TID   Follow-up: 6 months  I spent a total of 30 minutes on the date of the service. Discussed medication side effects, adverse reactions and drug interactions. Written educational materials and patient instructions outlining all of the above were given.  61, MD 02/17/22 2:55 PM

## 2022-02-17 NOTE — Patient Instructions (Signed)
Donepezil We recommende that you take Donepezil (Aricept) 5mg  tab once a day for the first 4 weeks, then increase the dose to 10mg  once per day. It is better to take Donepezil with breakfast or lunch. Aricept is well tolerated, although some people may experience nausea. These side effects are usually mild and temporary. If symptoms continue or become severe, you should stop the medication and contact .

## 2022-02-24 DIAGNOSIS — N1832 Chronic kidney disease, stage 3b: Secondary | ICD-10-CM | POA: Diagnosis not present

## 2022-02-24 DIAGNOSIS — N2581 Secondary hyperparathyroidism of renal origin: Secondary | ICD-10-CM | POA: Diagnosis not present

## 2022-02-24 DIAGNOSIS — I129 Hypertensive chronic kidney disease with stage 1 through stage 4 chronic kidney disease, or unspecified chronic kidney disease: Secondary | ICD-10-CM | POA: Diagnosis not present

## 2022-02-24 DIAGNOSIS — D631 Anemia in chronic kidney disease: Secondary | ICD-10-CM | POA: Diagnosis not present

## 2022-04-02 DIAGNOSIS — J449 Chronic obstructive pulmonary disease, unspecified: Secondary | ICD-10-CM | POA: Diagnosis not present

## 2022-04-02 DIAGNOSIS — G20A2 Parkinson's disease without dyskinesia, with fluctuations: Secondary | ICD-10-CM | POA: Diagnosis not present

## 2022-04-30 DIAGNOSIS — N183 Chronic kidney disease, stage 3 unspecified: Secondary | ICD-10-CM | POA: Diagnosis not present

## 2022-04-30 DIAGNOSIS — R1032 Left lower quadrant pain: Secondary | ICD-10-CM | POA: Diagnosis not present

## 2022-04-30 DIAGNOSIS — I739 Peripheral vascular disease, unspecified: Secondary | ICD-10-CM | POA: Diagnosis not present

## 2022-05-03 ENCOUNTER — Other Ambulatory Visit: Payer: Self-pay | Admitting: Family Medicine

## 2022-05-03 DIAGNOSIS — R1032 Left lower quadrant pain: Secondary | ICD-10-CM

## 2022-05-05 ENCOUNTER — Other Ambulatory Visit (HOSPITAL_BASED_OUTPATIENT_CLINIC_OR_DEPARTMENT_OTHER): Payer: Self-pay | Admitting: Family Medicine

## 2022-05-05 DIAGNOSIS — R1032 Left lower quadrant pain: Secondary | ICD-10-CM

## 2022-05-15 ENCOUNTER — Ambulatory Visit (HOSPITAL_BASED_OUTPATIENT_CLINIC_OR_DEPARTMENT_OTHER)
Admission: RE | Admit: 2022-05-15 | Discharge: 2022-05-15 | Disposition: A | Discharge status: Home / Self Care | Payer: Medicare Other | Source: Ambulatory Visit | Attending: Family Medicine | Admitting: Family Medicine

## 2022-05-15 DIAGNOSIS — R1032 Left lower quadrant pain: Secondary | ICD-10-CM | POA: Insufficient documentation

## 2022-05-15 DIAGNOSIS — R109 Unspecified abdominal pain: Secondary | ICD-10-CM | POA: Diagnosis not present

## 2022-05-15 DIAGNOSIS — I700 Atherosclerosis of aorta: Secondary | ICD-10-CM | POA: Diagnosis not present

## 2022-05-28 ENCOUNTER — Other Ambulatory Visit: Payer: Medicare Other

## 2022-06-08 DIAGNOSIS — N1831 Chronic kidney disease, stage 3a: Secondary | ICD-10-CM | POA: Diagnosis not present

## 2022-06-08 DIAGNOSIS — E7800 Pure hypercholesterolemia, unspecified: Secondary | ICD-10-CM | POA: Diagnosis not present

## 2022-06-08 DIAGNOSIS — N2581 Secondary hyperparathyroidism of renal origin: Secondary | ICD-10-CM | POA: Diagnosis not present

## 2022-06-08 DIAGNOSIS — I10 Essential (primary) hypertension: Secondary | ICD-10-CM | POA: Diagnosis not present

## 2022-06-08 DIAGNOSIS — R413 Other amnesia: Secondary | ICD-10-CM | POA: Diagnosis not present

## 2022-06-08 DIAGNOSIS — I679 Cerebrovascular disease, unspecified: Secondary | ICD-10-CM | POA: Diagnosis not present

## 2022-06-08 DIAGNOSIS — I739 Peripheral vascular disease, unspecified: Secondary | ICD-10-CM | POA: Diagnosis not present

## 2022-06-08 DIAGNOSIS — G20C Parkinsonism, unspecified: Secondary | ICD-10-CM | POA: Diagnosis not present

## 2022-08-24 DIAGNOSIS — I129 Hypertensive chronic kidney disease with stage 1 through stage 4 chronic kidney disease, or unspecified chronic kidney disease: Secondary | ICD-10-CM | POA: Diagnosis not present

## 2022-08-24 DIAGNOSIS — N183 Chronic kidney disease, stage 3 unspecified: Secondary | ICD-10-CM | POA: Diagnosis not present

## 2022-08-24 DIAGNOSIS — N2581 Secondary hyperparathyroidism of renal origin: Secondary | ICD-10-CM | POA: Diagnosis not present

## 2022-08-24 DIAGNOSIS — N1832 Chronic kidney disease, stage 3b: Secondary | ICD-10-CM | POA: Diagnosis not present

## 2022-08-24 DIAGNOSIS — D631 Anemia in chronic kidney disease: Secondary | ICD-10-CM | POA: Diagnosis not present

## 2022-08-24 NOTE — Progress Notes (Unsigned)
No chief complaint on file.   HISTORY OF PRESENT ILLNESS:  08/24/22 ALL:  [name] Juarez is a 77 y.o. male here today for follow up for dementia.    HISTORY (copied from Dr Quentin Mulling previous note)  77 year old male with a history of abdominal aneurysm, HTN, prediabetes, HLD, COPD, PAD, right frontal CVA who follows in clinic for dementia and imbalance. MRI brain in 2020 with generalized brain atrophy and a remote right frontal stroke.   At his last visit he was continued on Namenda 10 mg BID. He was started on Sinemet trial for tremor and bradykinesia.   Interval History: He was hospitalized in September for generalized weakness and confusion. He was found to have an AKI at that time. Also was diagnosed with shingles around that time.    Memory is about the same as his last visit. He would like to try adding donepezil again to see if the combination with Namenda is helpful. He just finished PT yesterday for his gait. Walking has improved and he is shuffling less than previously. He is currently walking with a cane. Tremor and bradykinesia have also improved.   REVIEW OF SYSTEMS: Out of a complete 14 system review of symptoms, the patient complains only of the following symptoms, and all other reviewed systems are negative.   ALLERGIES: No Known Allergies   HOME MEDICATIONS: Outpatient Medications Prior to Visit  Medication Sig Dispense Refill   acetaminophen (TYLENOL) 500 MG tablet Take 2 tablets (1,000 mg total) by mouth every 6 (six) hours as needed for mild pain. 30 tablet 0   carbidopa-levodopa (SINEMET IR) 25-100 MG tablet Take 1 tablet by mouth 3 (three) times daily. 90 tablet 3   donepezil (ARICEPT) 10 MG tablet Take 1 tablet (10 mg total) by mouth daily. 30 tablet 6   donepezil (ARICEPT) 5 MG tablet Take 1 tablet (5 mg total) by mouth daily. 30 tablet 0   fexofenadine (ALLEGRA) 180 MG tablet Take 180 mg by mouth daily as needed for allergies or rhinitis.     ipratropium  (ATROVENT) 0.03 % nasal spray Place 2 sprays into both nostrils every 12 (twelve) hours.     memantine (NAMENDA) 10 MG tablet Take 10 mg by mouth 2 (two) times daily.     Probiotic Product (PROBIOTIC PO) Take 1 capsule by mouth daily.     No facility-administered medications prior to visit.     PAST MEDICAL HISTORY: Past Medical History:  Diagnosis Date   Acute appendicitis with peritonitis, perforation s/p lap appy 08/03/2013 08/03/2013   Chronic kidney disease (CKD) stage G3a/A1, moderately decreased glomerular filtration rate (GFR) between 45-59 mL/min/1.73 square meter and albuminuria creatinine ratio less than 30 mg/g (HCC)    ckd stage 3 b   Colon polyp 10/2006   Colovesical fistula 11/14/2019   Dementia (HCC)    early stage   Erectile dysfunction    History of hypertension    off all meds x 2 years as of 05-13-2020   Hyperlipidemia    Inguinal hernia    Left   Memory loss    Peripheral vascular disease (HCC)    Pre-diabetes    Tuberculosis    age 36 tx done   Varicose veins    both legs   Wears partial dentures upper and lower     PAST SURGICAL HISTORY: Past Surgical History:  Procedure Laterality Date   ABDOMINAL AORTIC ENDOVASCULAR STENT GRAFT  11/03/2011   right leg   COLONOSCOPY  2021   CYSTOSCOPY W/ URETERAL STENT PLACEMENT N/A 01/31/2020   Procedure: FIREFLY INJECTION;  Surgeon: Jannifer Hick, MD;  Location: [facility];  Service: Urology;  Laterality: N/A;   evar  2013   INGUINAL HERNIA REPAIR Right 2000   open RIH repair   INGUINAL HERNIA REPAIR Left 05/15/2020   Procedure: LAPAROSCOPIC LEFT  INGUINAL HERNIA REPAIR with mesh ;  Surgeon: Karie Soda, MD;  Location: [facility];  Service: General;  Laterality: Left;   KNEE SURGERY Right    LAPAROSCOPIC APPENDECTOMY N/A 08/03/2013   Procedure: APPENDECTOMY LAPAROSCOPIC;  Surgeon: Velora Heckler, MD;  Location: [facility];  Service: General;  Laterality: N/A;   LAPAROSCOPIC LOW ANTERIOR RESECTION  10/  28/2021     FAMILY HISTORY: Family History  Problem Relation Age of Onset   Tuberculosis Father    Tuberculosis Sister      SOCIAL HISTORY: Social History   Socioeconomic History   Marital status: Married    Spouse name: Not on file   Number of children: 2   Years of education: Not on file   Highest education level: Not on file  Occupational History   Not on file  Tobacco Use   Smoking status: Former    Packs/day: 1.00    Years: 45.00    Additional pack years: 0.00    Total pack years: 45.00    Types: Cigarettes    Quit date: 11/05/2011    Years since quitting: 10.8   Smokeless tobacco: Never  Vaping Use   Vaping Use: Never used  Substance and Sexual Activity   Alcohol use: Yes    Comment: 3 per day   Drug use: No   Sexual activity: Not on file  Other Topics Concern   Not on file  Social History Narrative   Originally from [place].  [place], [place]    In [place] since 1970s, lives with wife 08/12/21   Right handed   2-3 cups of coffee per day   Social Determinants of Health   Financial Resource Strain: Not on file  Food Insecurity: No Food Insecurity (12/26/2021)   Hunger Vital Sign    Worried About Running Out of Food in the Last Year: Never true    Ran Out of Food in the Last Year: Never true  Transportation Needs: No Transportation Needs (12/26/2021)   PRAPARE - Administrator, Civil Service (Medical): No    Lack of Transportation (Non-Medical): No  Physical Activity: Not on file  Stress: Not on file  Social Connections: Not on file  Intimate Partner Violence: Not At Risk (12/26/2021)   Humiliation, Afraid, Rape, and Kick questionnaire    Fear of Current or Ex-Partner: No    Emotionally Abused: No    Physically Abused: No    Sexually Abused: No     PHYSICAL EXAM  There were no vitals filed for this visit. There is no height or weight on file to calculate BMI.  Generalized: Well developed, in no acute distress  Cardiology:  normal rate and rhythm, no murmur auscultated  Respiratory: clear to auscultation bilaterally    Neurological examination  Mentation: Alert oriented to time, place, history taking. Follows all commands speech and language fluent Cranial nerve II-XII: Pupils were equal round reactive to light. Extraocular movements were full, visual field were full on confrontational test. Facial sensation and strength were normal. Uvula tongue midline. Head turning and shoulder shrug  were normal and symmetric. Motor: The motor  testing reveals 5 over 5 strength of all 4 extremities. Good symmetric motor tone is noted throughout.  Sensory: Sensory testing is intact to soft touch on all 4 extremities. No evidence of extinction is noted.  Coordination: Cerebellar testing reveals good finger-nose-finger and heel-to-shin bilaterally.  Gait and station: Gait is normal. Tandem gait is normal. Romberg is negative. No drift is seen.  Reflexes: Deep tendon reflexes are symmetric and normal bilaterally.    DIAGNOSTIC DATA (LABS, IMAGING, TESTING) - I reviewed patient records, labs, notes, testing and imaging myself where available.  Lab Results  Component Value Date   WBC 9.3 12/29/2021   HGB 12.1 (L) 12/29/2021   HCT 39.4 12/29/2021   MCV 98.0 12/29/2021   PLT 183 12/29/2021      Component Value Date/Time   NA 138 12/30/2021 0344   K 4.0 12/30/2021 0344   CL 110 12/30/2021 0344   CO2 21 (L) 12/30/2021 0344   GLUCOSE 155 (H) 12/30/2021 0344   BUN 38 (H) 12/30/2021 0344   CREATININE 1.30 (H) 01/02/2022 0426   CREATININE 0.97 09/02/2011 0909   CALCIUM 8.4 (L) 12/30/2021 0344   PROT 7.4 12/26/2021 1317   ALBUMIN 3.8 12/26/2021 1317   AST 19 12/26/2021 1317   ALT 17 12/26/2021 1317   ALKPHOS 37 (L) 12/26/2021 1317   BILITOT 0.9 12/26/2021 1317   GFRNONAA 57 (L) 01/02/2022 0426   GFRAA 44 (L) 11/17/2019 0327   No results found for: "CHOL", "HDL", "LDLCALC", "LDLDIRECT", "TRIG", "CHOLHDL" No results found  for: "HGBA1C" Lab Results  Component Value Date   VITAMINB12 397 08/17/2021   Lab Results  Component Value Date   TSH 1.526 11/14/2019        No data to display              08/17/2021   10:23 AM  Montreal Cognitive Assessment   Visuospatial/ Executive (0/5) 2  Naming (0/3) 1  Attention: Read list of digits (0/2) 2  Attention: Read list of letters (0/1) 0  Attention: Serial 7 subtraction starting at 100 (0/3) 0  Language: Repeat phrase (0/2) 0  Language : Fluency (0/1) 0  Abstraction (0/2) 1  Delayed Recall (0/5) 0  Orientation (0/6) 5  Total 11     ASSESSMENT AND PLAN  77 y.o. year old male  has a past medical history of Acute appendicitis with peritonitis, perforation s/p lap appy 08/03/2013 (08/03/2013), Chronic kidney disease (CKD) stage G3a/A1, moderately decreased glomerular filtration rate (GFR) between 45-59 mL/min/1.73 square meter and albuminuria creatinine ratio less than 30 mg/g (HCC), Colon polyp (10/2006), Colovesical fistula (11/14/2019), Dementia (HCC), Erectile dysfunction, History of hypertension, Hyperlipidemia, Inguinal hernia, Memory loss, Peripheral vascular disease (HCC), Pre-diabetes, Tuberculosis, Varicose veins, and Wears partial dentures (upper and lower). here with    No diagnosis found.  Shawna Orleans ***.  Healthy lifestyle habits encouraged. *** will follow up with PCP as directed. *** will return to see me in ***, sooner if needed. *** verbalizes understanding and agreement with this plan.   No orders of the defined types were placed in this encounter.    No orders of the defined types were placed in this encounter.    Shawnie Dapper, MSN, FNP-C 08/24/2022, 4:56 PM  [facility] [address], [address] [place], [place] [address] [phone]

## 2022-08-25 ENCOUNTER — Ambulatory Visit: Payer: Medicare Other | Admitting: Family Medicine

## 2022-08-25 ENCOUNTER — Encounter: Payer: Self-pay | Admitting: Family Medicine

## 2022-08-25 VITALS — BP 119/62 | HR 66 | Ht 69.6 in | Wt 148.5 lb

## 2022-08-25 DIAGNOSIS — G20C Parkinsonism, unspecified: Secondary | ICD-10-CM | POA: Diagnosis not present

## 2022-08-25 DIAGNOSIS — F03B0 Unspecified dementia, moderate, without behavioral disturbance, psychotic disturbance, mood disturbance, and anxiety: Secondary | ICD-10-CM

## 2022-08-25 MED ORDER — CARBIDOPA-LEVODOPA 25-100 MG PO TABS: 1.0000 | ORAL_TABLET | Freq: Three times a day (TID) | ORAL | 3 refills | Status: DC

## 2022-08-25 MED ORDER — DONEPEZIL HCL 10 MG PO TABS: 10.0000 mg | ORAL_TABLET | Freq: Every day | ORAL | 6 refills | Status: DC

## 2022-08-25 NOTE — Patient Instructions (Signed)
Below is our plan:  We will continue donepezil 10mg  daily, memantine 10mg  twice daily and carbidopa levodopa 25-100mg  three times daily.   Please make sure you are staying well hydrated. I recommend 50-60 ounces daily. Well balanced diet and regular exercise encouraged. Consistent sleep schedule with 6-8 hours recommended.   Please continue follow up with care team as directed.   Follow up with me in 1 year   You may receive a survey regarding today's visit. I encourage you to leave honest feed back as I do use this information to improve patient care. Thank you for seeing me today!   Management of Memory Problems   There are some general things you can do to help manage your memory problems.  Your memory may not in fact recover, but by using techniques and strategies you will be able to manage your memory difficulties better.   1)  Establish a routine. Try to establish and then stick to a regular routine.  By doing this, you will get used to what to expect and you will reduce the need to rely on your memory.  Also, try to do things at the same time of day, such as taking your medication or checking your calendar first thing in the morning. Think about think that you can do as a part of a regular routine and make a list.  Then enter them into a daily planner to remind you.  This will help you establish a routine.   2)  Organize your environment. Organize your environment so that it is uncluttered.  Decrease visual stimulation.  Place everyday items such as keys or cell phone in the same place every day (ie.  Basket next to front door) Use post it notes with a brief message to yourself (ie. Turn off light, lock the door) Use labels to indicate where things go (ie. Which cupboards are for food, dishes, etc.) Keep a notepad and pen by the telephone to take messages   3)  Memory Aids A diary or journal/notebook/daily planner Making a list (shopping list, chore list, to do list that needs to  be done) Using an alarm as a reminder (kitchen timer or cell phone alarm) Using cell phone to store information (Notes, Calendar, Reminders) Calendar/White board placed in a prominent position Post-it notes   In order for memory aids to be useful, you need to have good habits.  It's no good remembering to make a note in your journal if you don't remember to look in it.  Try setting aside a certain time of day to look in journal.   4)  Improving mood and managing fatigue. There may be other factors that contribute to memory difficulties.  Factors, such as anxiety, depression and tiredness can affect memory. Regular gentle exercise can help improve your mood and give you more energy. Exercise: there are short videos created by the [employer] on Health specially for older adults: https://bit.ly/2I30q97.  Mediterranean diet: which emphasizes fruits, vegetables, whole grains, legumes, fish, and other seafood; unsaturated fats such as olive oils; and low amounts of red meat, eggs, and sweets. A variation of this, called MIND (Mediterranean-DASH Intervention for Neurodegenerative Delay) incorporates the DASH (Dietary Approaches to Stop Hypertension) diet, which has been shown to lower high blood pressure, a risk factor for Alzheimer's disease. More information at: ExitMarketing.de.  Aerobic exercise that improve heart health is also good for the mind.  [employer] on Aging have short videos for exercises that you can do  at home: BlindWorkshop.com.pt Simple relaxation techniques may help relieve symptoms of anxiety Try to get back to completing activities or hobbies you enjoyed doing in the past. Learn to pace yourself through activities to decrease fatigue. Find out about some local support groups where you can share experiences with others. Try and achieve 7-8 hours of sleep at night.   Resources for  Family/Caregiver  Online caregiver support groups can be found at WesternTunes.it or call Alzheimer's Association's 24/7 hotline: 712-697-2474. [facility] offers in-person, virtual support groups and individual counseling for both care partners and persons with memory loss. Call for more information at [phone] [phone].   Advanced care plan: there are two types of Power of Attorney: healthcare and durable. Healthcare POA is a designated person to make healthcare decisions on your behalf if you were too sick to make them yourself. This person can be selected and documented by your physician. Durable POA has to be set up with a lawyer who takes charge of your finances and estate if you were too sick or cognitively impaired to manage your finances accurately. You can find a local Elder Therapist, art here: [place].at.  Check out www.planyourlifespan.org, which will help you plan before a crisis and decide who will take care of life considerations in a circumstance where you may not be able to speak for yourself.   Helpful books (available on [employer] or your local bookstore):  By Dr. Carl Best: Keeping Love Alive as Memories Fade: The 5 Love Languages and the Alzheimer's Journey Jan 04, 2015 The Dementia Care Partner's Workbook: A Guide for Understanding, Education, and [employer] - September 03, 2017.  Both available for less than $15.   "Coping with behavior change in dementia: a family caregiver's guide" by Ricardo Jericho & Valora Piccolo "A Caregiver's Guide to Dementia: Using Activities and Other Strategies to Prevent, Reduce and Manage Behavioral Symptoms" by Mahala Menghini Gitlin and [employer].  Youth worker of Joy for the Person with Alzheimer's or Dementia" 4th edition by Tama High  Caregiver videos on common behaviors related to dementia: PopulationGame.pl  [place] Caregiver Portal: free to sign up, links to local resources:  https://[email or web address]-caregivers.com/login

## 2022-12-31 DIAGNOSIS — I679 Cerebrovascular disease, unspecified: Secondary | ICD-10-CM | POA: Diagnosis not present

## 2022-12-31 DIAGNOSIS — G20C Parkinsonism, unspecified: Secondary | ICD-10-CM | POA: Diagnosis not present

## 2022-12-31 DIAGNOSIS — R413 Other amnesia: Secondary | ICD-10-CM | POA: Diagnosis not present

## 2022-12-31 DIAGNOSIS — N1831 Chronic kidney disease, stage 3a: Secondary | ICD-10-CM | POA: Diagnosis not present

## 2022-12-31 DIAGNOSIS — E7800 Pure hypercholesterolemia, unspecified: Secondary | ICD-10-CM | POA: Diagnosis not present

## 2022-12-31 DIAGNOSIS — I739 Peripheral vascular disease, unspecified: Secondary | ICD-10-CM | POA: Diagnosis not present

## 2022-12-31 DIAGNOSIS — N2581 Secondary hyperparathyroidism of renal origin: Secondary | ICD-10-CM | POA: Diagnosis not present

## 2022-12-31 DIAGNOSIS — I10 Essential (primary) hypertension: Secondary | ICD-10-CM | POA: Diagnosis not present

## 2022-12-31 DIAGNOSIS — L570 Actinic keratosis: Secondary | ICD-10-CM | POA: Diagnosis not present

## 2022-12-31 DIAGNOSIS — Z23 Encounter for immunization: Secondary | ICD-10-CM | POA: Diagnosis not present

## 2022-12-31 DIAGNOSIS — R7303 Prediabetes: Secondary | ICD-10-CM | POA: Diagnosis not present

## 2023-04-28 ENCOUNTER — Other Ambulatory Visit (HOSPITAL_COMMUNITY): Payer: Self-pay

## 2023-04-28 ENCOUNTER — Other Ambulatory Visit: Payer: Self-pay

## 2023-04-28 MED ORDER — ESCITALOPRAM OXALATE 5 MG PO TABS
5.0000 mg | ORAL_TABLET | Freq: Every day | ORAL | 3 refills | Status: DC
  Filled 2023-04-28: qty 90, 90d supply, fill #0
  Filled 2023-07-23: qty 90, 90d supply, fill #1
  Filled 2023-10-21: qty 90, 90d supply, fill #2
  Filled 2024-01-19: qty 90, 90d supply, fill #3

## 2023-06-30 DIAGNOSIS — R7303 Prediabetes: Secondary | ICD-10-CM | POA: Diagnosis not present

## 2023-06-30 DIAGNOSIS — G20C Parkinsonism, unspecified: Secondary | ICD-10-CM | POA: Diagnosis not present

## 2023-06-30 DIAGNOSIS — E7800 Pure hypercholesterolemia, unspecified: Secondary | ICD-10-CM | POA: Diagnosis not present

## 2023-06-30 DIAGNOSIS — N1831 Chronic kidney disease, stage 3a: Secondary | ICD-10-CM | POA: Diagnosis not present

## 2023-06-30 DIAGNOSIS — N2581 Secondary hyperparathyroidism of renal origin: Secondary | ICD-10-CM | POA: Diagnosis not present

## 2023-06-30 DIAGNOSIS — I739 Peripheral vascular disease, unspecified: Secondary | ICD-10-CM | POA: Diagnosis not present

## 2023-08-30 ENCOUNTER — Encounter: Payer: Self-pay | Admitting: Family Medicine

## 2023-08-30 ENCOUNTER — Ambulatory Visit: Payer: Medicare Other | Admitting: Family Medicine

## 2023-08-30 VITALS — BP 116/66 | HR 61 | Ht 67.0 in | Wt 150.0 lb

## 2023-08-30 DIAGNOSIS — F03B0 Unspecified dementia, moderate, without behavioral disturbance, psychotic disturbance, mood disturbance, and anxiety: Secondary | ICD-10-CM | POA: Diagnosis not present

## 2023-08-30 DIAGNOSIS — G20C Parkinsonism, unspecified: Secondary | ICD-10-CM

## 2023-08-30 MED ORDER — MEMANTINE HCL 10 MG PO TABS: 10.0000 mg | ORAL_TABLET | Freq: Two times a day (BID) | ORAL | 3 refills | Status: AC

## 2023-08-30 MED ORDER — DONEPEZIL HCL 10 MG PO TABS: 10.0000 mg | ORAL_TABLET | Freq: Every day | ORAL | 3 refills | Status: AC

## 2023-08-30 MED ORDER — CARBIDOPA-LEVODOPA 25-100 MG PO TABS: 1.0000 | ORAL_TABLET | Freq: Three times a day (TID) | ORAL | 3 refills | Status: DC

## 2023-08-30 NOTE — Patient Instructions (Signed)
 Below is our plan:  We will continue memantine  10mg  twice daily, carbidopa -levodopa  three times daily and donepezil  10mg  at bedtime.   Please make sure you are staying well hydrated. I recommend 50-60 ounces daily. Well balanced diet and regular exercise encouraged. Consistent sleep schedule with 6-8 hours recommended.   Please continue follow up with care team as directed.   Follow up with me in 6-12 months   You may receive a survey regarding today's visit. I encourage you to leave honest feed back as I do use this information to improve patient care. Thank you for seeing me today!   Management of Memory Problems   There are some general things you can do to help manage your memory problems.  Your memory may not in fact recover, but by using techniques and strategies you will be able to manage your memory difficulties better.   1)  Establish a routine. Try to establish and then stick to a regular routine.  By doing this, you will get used to what to expect and you will reduce the need to rely on your memory.  Also, try to do things at the same time of day, such as taking your medication or checking your calendar first thing in the morning. Think about think that you can do as a part of a regular routine and make a list.  Then enter them into a daily planner to remind you.  This will help you establish a routine.   2)  Organize your environment. Organize your environment so that it is uncluttered.  Decrease visual stimulation.  Place everyday items such as keys or cell phone in the same place every day (ie.  Basket next to front door) Use post it notes with a brief message to yourself (ie. Turn off light, lock the door) Use labels to indicate where things go (ie. Which cupboards are for food, dishes, etc.) Keep a notepad and pen by the telephone to take messages   3)  Memory Aids A diary or journal/notebook/daily planner Making a list (shopping list, chore list, to do list that needs to  be done) Using an alarm as a reminder (kitchen timer or cell phone alarm) Using cell phone to store information (Notes, Calendar, Reminders) Calendar/White board placed in a prominent position Post-it notes   In order for memory aids to be useful, you need to have good habits.  It's no good remembering to make a note in your journal if you don't remember to look in it.  Try setting aside a certain time of day to look in journal.   4)  Improving mood and managing fatigue. There may be other factors that contribute to memory difficulties.  Factors, such as anxiety, depression and tiredness can affect memory. Regular gentle exercise can help improve your mood and give you more energy. Exercise: there are short videos created by the [employer] on Health specially for older adults: https://bit.ly/2I30q97.  Mediterranean diet: which emphasizes fruits, vegetables, whole grains, legumes, fish, and other seafood; unsaturated fats such as olive oils; and low amounts of red meat, eggs, and sweets. A variation of this, called MIND (Mediterranean-DASH Intervention for Neurodegenerative Delay) incorporates the DASH (Dietary Approaches to Stop Hypertension) diet, which has been shown to lower high blood pressure, a risk factor for Alzheimer's disease. More information at: ExitMarketing.de.  Aerobic exercise that improve heart health is also good for the mind.  [employer] on Aging have short videos for exercises that you can do at  home: BlindWorkshop.com.pt Simple relaxation techniques may help relieve symptoms of anxiety Try to get back to completing activities or hobbies you enjoyed doing in the past. Learn to pace yourself through activities to decrease fatigue. Find out about some local support groups where you can share experiences with others. Try and achieve 7-8 hours of sleep at night.   Resources for  Family/Caregiver  Online caregiver support groups can be found at WesternTunes.it or call Alzheimer's Association's 24/7 hotline: [phone]. [facility] Memory Counseling Program offers in-person, virtual support groups and individual counseling for both care partners and persons with memory loss. Call for more information at [phone].   Advanced care plan: there are two types of Power of Attorney: healthcare and durable. Healthcare POA is a designated person to make healthcare decisions on your behalf if you were too sick to make them yourself. This person can be selected and documented by your physician. Durable POA has to be set up with a lawyer who takes charge of your finances and estate if you were too sick or cognitively impaired to manage your finances accurately. You can find a local Elder Therapist, art here: [place].at.  Check out www.planyourlifespan.org, which will help you plan before a crisis and decide who will take care of life considerations in a circumstance where you may not be able to speak for yourself.   Helpful books (available on [employer] or your local bookstore):  By Dr. Albina Hull: Keeping Love Alive as Memories Fade: The 5 Love Languages and the Alzheimer's Journey Jan 04, 2015 The Dementia Care Partner's Workbook: A Guide for Understanding, Education, and [employer] - September 03, 2017.  Both available for less than $15.   "Coping with behavior change in dementia: a family caregiver's guide" by Asberry Lav & Leonides Ramp "A Caregiver's Guide to Dementia: Using Activities and Other Strategies to Prevent, Reduce and Manage Behavioral Symptoms" by Beth Brooke Gitlin and Catherine Piersol.  Youth worker of Joy for the Person with Alzheimer's or Dementia" 4th edition by Hardy Lia  Caregiver videos on common behaviors related to dementia: PopulationGame.pl  [place] Caregiver Portal: free to sign up, links to local resources:  [email or web address]-caregivers.[email or web address]/login

## 2023-08-30 NOTE — Progress Notes (Signed)
 Chief Complaint  Patient presents with   RM1/MEMORY    Pt is here with his Wife. Pt's wife states that pt's memory has gotten worse. Pt's wife states pt will forget names and names of things, but they wok it out. Pt's wife states that she would like to discuss the pt driving.     HISTORY OF PRESENT ILLNESS:  08/30/23 ALL:  [name] Juarez returns for follow up for dementia, tremor and bradykinesias. He was last seen by me 08/2022 and doing well on memantine  10mg  BID, carb-levo IR TID and donepezil  10mg  daily.   Since, he reports doing fairly well. He is with his wife, [name] Juarez, who aids in history. She tells me that his cognitive functioning has declined since last visit. He has more difficulty remembering names of friends/family and finding words. She is usually able to figure out what he is trying to say. He was started on escitalopram  by PCP 04/2023. This has helped with irritability. He is sleeping well. Appetite is good. No trouble swallowing. Tremor stable. Gait stable. No falls. Takes small steps. He enjoys working in his yard. He uses a stationary peddler. He has not driven in .   08/25/2022 ALL:  [name] Juarez is a 78 y.o. male here today for follow up for dementia, tremor and bradykinesias. He was last seen by Dr Billy Bue 02/2022 and reported doing fairly well. Memantine  10mg  BID and carb-levo 25-100mg  TID continued. Donepezil  10mg  daily added.   Since, he reports doing well. He is tolerating meds. He feels gait is stable and movements are improved on carb-levo. Memory is fairly stable. He continues to perform ADLs independently. He does not drive. Wife assists with meds. He is irritable at times. Wife is concerned about him forgetting his two daughter's names. He continues to recognize faces.   HISTORY (copied from Dr Margrette Shield previous note)  78 year old male with a history of abdominal aneurysm, HTN, prediabetes, HLD, COPD, PAD, right frontal CVA who follows in clinic for dementia and  imbalance. MRI brain in 2020 with generalized brain atrophy and a remote right frontal stroke.   At his last visit he was continued on Namenda  10 mg BID. He was started on Sinemet  trial for tremor and bradykinesia.   Interval History: He was hospitalized in September for generalized weakness and confusion. He was found to have an AKI at that time. Also was diagnosed with shingles around that time.    Memory is about the same as his last visit. He would like to try adding donepezil  again to see if the combination with Namenda  is helpful. He just finished PT yesterday for his gait. Walking has improved and he is shuffling less than previously. He is currently walking with a cane. Tremor and bradykinesia have also improved.   REVIEW OF SYSTEMS: Out of a complete 14 system review of symptoms, the patient complains only of the following symptoms, memory loss and all other reviewed systems are negative.   ALLERGIES: No Known Allergies   HOME MEDICATIONS: Outpatient Medications Prior to Visit  Medication Sig Dispense Refill   acetaminophen  (TYLENOL ) 500 MG tablet Take 2 tablets (1,000 mg total) by mouth every 6 (six) hours as needed for mild pain. 30 tablet 0   escitalopram  (LEXAPRO ) 5 MG tablet Take 1 tablet (5 mg total) by mouth daily. 90 tablet 3   fexofenadine (ALLEGRA) 180 MG tablet Take 180 mg by mouth daily as needed for allergies or rhinitis.     ipratropium (ATROVENT)  0.03 % nasal spray Place 2 sprays into both nostrils every 12 (twelve) hours.     Probiotic Product (PROBIOTIC PO) Take 1 capsule by mouth daily.     carbidopa -levodopa  (SINEMET  IR) 25-100 MG tablet Take 1 tablet by mouth 3 (three) times daily. 90 tablet 3   donepezil  (ARICEPT ) 10 MG tablet Take 1 tablet (10 mg total) by mouth daily. 30 tablet 6   memantine  (NAMENDA ) 10 MG tablet Take 10 mg by mouth 2 (two) times daily.     No facility-administered medications prior to visit.     PAST MEDICAL HISTORY: Past Medical  History:  Diagnosis Date   Acute appendicitis with peritonitis, perforation s/p lap appy 08/03/2013 08/03/2013   Chronic kidney disease (CKD) stage G3a/A1, moderately decreased glomerular filtration rate (GFR) between 45-59 mL/min/1.73 square meter and albuminuria creatinine ratio less than 30 mg/g (HCC)    ckd stage 3 b   Colon polyp 10/2006   Colovesical fistula 11/14/2019   Dementia (HCC)    early stage   Erectile dysfunction    History of hypertension    off all meds x 2 years as of 05-13-2020   Hyperlipidemia    Inguinal hernia    Left   Memory loss    Peripheral vascular disease (HCC)    Pre-diabetes    Tuberculosis    age 6 tx done   Varicose veins    both legs   Wears partial dentures upper and lower     PAST SURGICAL HISTORY: Past Surgical History:  Procedure Laterality Date   ABDOMINAL AORTIC ENDOVASCULAR STENT GRAFT  11/03/2011   right leg   COLONOSCOPY  2021   CYSTOSCOPY W/ URETERAL STENT PLACEMENT N/A 01/31/2020   Procedure: FIREFLY INJECTION;  Surgeon: Lahoma Pigg, MD;  Location: [facility];  Service: Urology;  Laterality: N/A;   evar  2013   INGUINAL HERNIA REPAIR Right 2000   open RIH repair   INGUINAL HERNIA REPAIR Left 05/15/2020   Procedure: LAPAROSCOPIC LEFT  INGUINAL HERNIA REPAIR with mesh ;  Surgeon: Candyce Champagne, MD;  Location: [facility] [place];  Service: General;  Laterality: Left;   KNEE SURGERY Right    LAPAROSCOPIC APPENDECTOMY N/A 08/03/2013   Procedure: APPENDECTOMY LAPAROSCOPIC;  Surgeon: Keitha Pata, MD;  Location: [facility];  Service: General;  Laterality: N/A;   LAPAROSCOPIC LOW ANTERIOR RESECTION  10/ 28/2021     FAMILY HISTORY: Family History  Problem Relation Age of Onset   Tuberculosis Father    Tuberculosis Sister    Dementia Neg Hx    Alzheimer's disease Neg Hx      SOCIAL HISTORY: Social History   Socioeconomic History   Marital status: Married    Spouse name: Not on file   Number of children: 2   Years of  education: Not on file   Highest education level: Not on file  Occupational History   Not on file  Tobacco Use   Smoking status: Former    Current packs/day: 0.00    Average packs/day: 1 pack/day for 45.0 years (45.0 ttl pk-yrs)    Types: Cigarettes    Start date: 11/05/1966    Quit date: 11/05/2011    Years since quitting: 11.8   Smokeless tobacco: Never  Vaping Use   Vaping status: Never Used  Substance and Sexual Activity   Alcohol use: Yes    Comment: 3 per day   Drug use: No   Sexual activity: Not on file  Other Topics Concern  Not on file  Social History Narrative   Originally from [place].  [place], [place]    In [place] 1970s, lives with wife 08/12/21   Right handed   2-3 cups of coffee per day   Social Drivers of Health   Financial Resource Strain: Not on file  Food Insecurity: No Food Insecurity (12/26/2021)   Hunger Vital Sign    Worried About Running Out of Food in the Last Year: Never true    Ran Out of Food in the Last Year: Never true  Transportation Needs: No Transportation Needs (12/26/2021)   PRAPARE - Administrator, Civil Service (Medical): No    Lack of Transportation (Non-Medical): No  Physical Activity: Not on file  Stress: Not on file  Social Connections: Not on file  Intimate Partner Violence: Not At Risk (12/26/2021)   Humiliation, Afraid, Rape, and Kick questionnaire    Fear of Current or Ex-Partner: No    Emotionally Abused: No    Physically Abused: No    Sexually Abused: No     PHYSICAL EXAM  Vitals:   08/30/23 1408  BP: 116/66  Pulse: 61  Weight: 150 lb (68 kg)  Height: 5\' 7"  (1.702 m)    Body mass index is 23.49 kg/m.  Generalized: Well developed, in no acute distress  Cardiology: normal rate and rhythm, no murmur auscultated  Respiratory: clear to auscultation bilaterally    Neurological examination  Mentation: Alert oriented to time, place, history taking. Follows all commands speech and language  fluent Cranial nerve II-XII: Pupils were equal round reactive to light. Extraocular movements were full, visual field were full on confrontational test. Facial sensation and strength were normal. Uvula tongue midline. Head turning and shoulder shrug  were normal and symmetric. Motor: The motor testing reveals 5 over 5 strength of all 4 extremities. Good symmetric motor tone is noted throughout.  Sensory: Sensory testing is intact to soft touch on all 4 extremities. No evidence of extinction is noted.  Coordination: Cerebellar testing reveals good finger-nose-finger and heel-to-shin bilaterally.  Gait and station: Gait is normal. Tandem gait is normal. Romberg is negative. No drift is seen.  Reflexes: Deep tendon reflexes are symmetric and normal bilaterally.    DIAGNOSTIC DATA (LABS, IMAGING, TESTING) - I reviewed patient records, labs, notes, testing and imaging myself where available.  Lab Results  Component Value Date   WBC 9.3 12/29/2021   HGB 12.1 (L) 12/29/2021   HCT 39.4 12/29/2021   MCV 98.0 12/29/2021   PLT 183 12/29/2021      Component Value Date/Time   NA 138 12/30/2021 0344   K 4.0 12/30/2021 0344   CL 110 12/30/2021 0344   CO2 21 (L) 12/30/2021 0344   GLUCOSE 155 (H) 12/30/2021 0344   BUN 38 (H) 12/30/2021 0344   CREATININE 1.30 (H) 01/02/2022 0426   CREATININE 0.97 09/02/2011 0909   CALCIUM 8.4 (L) 12/30/2021 0344   PROT 7.4 12/26/2021 1317   ALBUMIN 3.8 12/26/2021 1317   AST 19 12/26/2021 1317   ALT 17 12/26/2021 1317   ALKPHOS 37 (L) 12/26/2021 1317   BILITOT 0.9 12/26/2021 1317   GFRNONAA 57 (L) 01/02/2022 0426   GFRAA 44 (L) 11/17/2019 0327   No results found for: "CHOL", "HDL", "LDLCALC", "LDLDIRECT", "TRIG", "CHOLHDL" No results found for: "HGBA1C" Lab Results  Component Value Date   VITAMINB12 397 08/17/2021   Lab Results  Component Value Date   TSH 1.526 11/14/2019  08/30/2023    2:11 PM 08/25/2022    2:27 PM  MMSE - Mini Mental State  Exam  Orientation to time 1 1  Orientation to Place 1 2  Registration 3 3  Attention/ Calculation 0 0  Recall 0 0  Language- name 2 objects 0 1  Language- repeat 0 0  Language- follow 3 step command 3 2  Language- read & follow direction 1 1  Write a sentence 0 0  Copy design 0 1  Total score 9 11        08/17/2021   10:23 AM  Montreal Cognitive Assessment   Visuospatial/ Executive (0/5) 2  Naming (0/3) 1  Attention: Read list of digits (0/2) 2  Attention: Read list of letters (0/1) 0  Attention: Serial 7 subtraction starting at 100 (0/3) 0  Language: Repeat phrase (0/2) 0  Language : Fluency (0/1) 0  Abstraction (0/2) 1  Delayed Recall (0/5) 0  Orientation (0/6) 5  Total 11     ASSESSMENT AND PLAN  78 y.o. year old male  has a past medical history of Acute appendicitis with peritonitis, perforation s/p lap appy 08/03/2013 (08/03/2013), Chronic kidney disease (CKD) stage G3a/A1, moderately decreased glomerular filtration rate (GFR) between 45-59 mL/min/1.73 square meter and albuminuria creatinine ratio less than 30 mg/g (HCC), Colon polyp (10/2006), Colovesical fistula (11/14/2019), Dementia (HCC), Erectile dysfunction, History of hypertension, Hyperlipidemia, Inguinal hernia, Memory loss, Peripheral vascular disease (HCC), Pre-diabetes, Tuberculosis, Varicose veins, and Wears partial dentures (upper and lower). here with    Parkinsonism, unspecified Parkinsonism type (HCC)  Moderate dementia without behavioral disturbance, psychotic disturbance, mood disturbance, or anxiety, unspecified dementia type (HCC)  [name] Juarez is doing well. We will continue donepezil  10mg  QD, memantine  10mg  BID and carb-levo TID. MMSE 9/30, previously 11/30. Memory compensation strategies reviewed. Healthy lifestyle habits encouraged. He will follow up with PCP as directed. He will return to see me in 6-12 months, sooner if needed. He and his wife verbalize understanding and agreement with this plan.    No orders of the defined types were placed in this encounter.    Meds ordered this encounter  Medications   carbidopa -levodopa  (SINEMET  IR) 25-100 MG tablet    Sig: Take 1 tablet by mouth 3 (three) times daily.    Dispense:  90 tablet    Refill:  3    Supervising Provider:   AHERN, ANTONIA B [[id number]]   donepezil  (ARICEPT ) 10 MG tablet    Sig: Take 1 tablet (10 mg total) by mouth daily.    Dispense:  90 tablet    Refill:  3    Supervising Provider:   AHERN, ANTONIA B [[id number]]   memantine  (NAMENDA ) 10 MG tablet    Sig: Take 1 tablet (10 mg total) by mouth 2 (two) times daily.    Dispense:  180 tablet    Refill:  3    Supervising Provider:   AHERN, ANTONIA B [id number]    I spent 30 minutes of face-to-face and non-face-to-face time with patient.  This included previsit chart review, lab review, study review, order entry, electronic health record documentation, patient education.   Terrilyn Fick, MSN, FNP-C 08/30/2023, 4:03 PM  [facility] [address], [address] [place], [place] [address] [phone]

## 2023-10-24 DIAGNOSIS — R6889 Other general symptoms and signs: Secondary | ICD-10-CM | POA: Diagnosis not present

## 2024-01-11 ENCOUNTER — Other Ambulatory Visit: Payer: Self-pay

## 2024-01-11 MED ORDER — CARBIDOPA-LEVODOPA 25-100 MG PO TABS: 1.0000 | ORAL_TABLET | Freq: Three times a day (TID) | ORAL | 3 refills | Status: DC

## 2024-02-02 DIAGNOSIS — R7303 Prediabetes: Secondary | ICD-10-CM | POA: Diagnosis not present

## 2024-02-02 DIAGNOSIS — E7800 Pure hypercholesterolemia, unspecified: Secondary | ICD-10-CM | POA: Diagnosis not present

## 2024-02-02 DIAGNOSIS — Z0000 Encounter for general adult medical examination without abnormal findings: Secondary | ICD-10-CM | POA: Diagnosis not present

## 2024-02-02 DIAGNOSIS — N1831 Chronic kidney disease, stage 3a: Secondary | ICD-10-CM | POA: Diagnosis not present

## 2024-02-17 ENCOUNTER — Ambulatory Visit: Admitting: Podiatry

## 2024-02-17 DIAGNOSIS — L84 Corns and callosities: Secondary | ICD-10-CM | POA: Diagnosis not present

## 2024-02-17 NOTE — Progress Notes (Signed)
 Subjective:  Patient ID: [name] Juarez, male    DOB: 1945/05/27,  MRN: [id number]  Chief Complaint  Patient presents with   Toe Pain    Right great toe pain     78 y.o. male presents with the above complaint.  Patient has a right hallux and second digit heloma molle pain on palpation hurts with ambulation or shoe pressure has not seen anyone as prior to seeing me denies any other acute complaints.  He would like to discuss treatment options for this pain scale 7 out of 10 dull aching nature.   he has not tried shoe gear modification.   Review of Systems: Negative except as noted in the HPI. Denies N/V/F/Ch.  Past Medical History:  Diagnosis Date   Acute appendicitis with peritonitis, perforation s/p lap appy 08/03/2013 08/03/2013   Chronic kidney disease (CKD) stage G3a/A1, moderately decreased glomerular filtration rate (GFR) between 45-59 mL/min/1.73 square meter and albuminuria creatinine ratio less than 30 mg/g (HCC)    ckd stage 3 b   Colon polyp 10/2006   Colovesical fistula 11/14/2019   Dementia (HCC)    early stage   Erectile dysfunction    History of hypertension    off all meds x 2 years as of 05-13-2020   Hyperlipidemia    Inguinal hernia    Left   Memory loss    Peripheral vascular disease    Pre-diabetes    Tuberculosis    age 53 tx done   Varicose veins    both legs   Wears partial dentures upper and lower    Current Outpatient Medications:    acetaminophen  (TYLENOL ) 500 MG tablet, Take 2 tablets (1,000 mg total) by mouth every 6 (six) hours as needed for mild pain., Disp: 30 tablet, Rfl: 0   carbidopa -levodopa  (SINEMET  IR) 25-100 MG tablet, Take 1 tablet by mouth 3 (three) times daily., Disp: 90 tablet, Rfl: 3   donepezil  (ARICEPT ) 10 MG tablet, Take 1 tablet (10 mg total) by mouth daily., Disp: 90 tablet, Rfl: 3   escitalopram  (LEXAPRO ) 5 MG tablet, Take 1 tablet (5 mg total) by mouth daily., Disp: 90 tablet, Rfl: 3   fexofenadine (ALLEGRA) 180 MG tablet, Take 180  mg by mouth daily as needed for allergies or rhinitis., Disp: , Rfl:    ipratropium (ATROVENT) 0.03 % nasal spray, Place 2 sprays into both nostrils every 12 (twelve) hours., Disp: , Rfl:    memantine  (NAMENDA ) 10 MG tablet, Take 1 tablet (10 mg total) by mouth 2 (two) times daily., Disp: 180 tablet, Rfl: 3   Probiotic Product (PROBIOTIC PO), Take 1 capsule by mouth daily., Disp: , Rfl:   Social History   Tobacco Use  Smoking Status Former   Current packs/day: 0.00   Average packs/day: 1 pack/day for 45.0 years (45.0 ttl pk-yrs)   Types: Cigarettes   Start date: 11/05/1966   Quit date: 11/05/2011   Years since quitting: 12.3  Smokeless Tobacco Never    No Known Allergies Objective:  There were no vitals filed for this visit. There is no height or weight on file to calculate BMI. Constitutional Well developed. Well nourished.  Vascular Dorsalis pedis pulses palpable bilaterally. Posterior tibial pulses palpable bilaterally. Capillary refill normal to all digits.  No cyanosis or clubbing noted. Pedal hair growth normal.  Neurologic Normal speech. Oriented to person, place, and time. Epicritic sensation to light touch grossly present bilaterally.  Dermatologic Right hallux and second digit heloma molle no open wounds or lesion  noted.  Pain on palpation to the lesion.  Mild hammertoe contracture noted.  Orthopedic: Normal joint ROM without pain or crepitus bilaterally. No visible deformities. No bony tenderness.   Radiographs: None Assessment:   1. Heloma molle    Plan:  Patient was evaluated and treated and all questions answered.  Right second digit and hallux heloma molle - All questions and concerns were discussed with the patient extensive detail extensively discussed shoe gear modification as well as spacers.  Spacers were dispensed if any foot and ankle issues arise future he will come back to see me.  If there is no reduction in pain we will discuss surgical  options.  No follow-ups on file.

## 2024-04-14 ENCOUNTER — Other Ambulatory Visit (HOSPITAL_COMMUNITY): Payer: Self-pay

## 2024-04-16 ENCOUNTER — Other Ambulatory Visit (HOSPITAL_COMMUNITY): Payer: Self-pay

## 2024-04-16 MED ORDER — ESCITALOPRAM OXALATE 5 MG PO TABS
5.0000 mg | ORAL_TABLET | Freq: Every day | ORAL | 1 refills | Status: AC
  Filled 2024-04-16: qty 90, 90d supply, fill #0

## 2024-04-19 ENCOUNTER — Other Ambulatory Visit: Payer: Self-pay

## 2024-04-19 MED ORDER — CARBIDOPA-LEVODOPA 25-100 MG PO TABS: 1.0000 | ORAL_TABLET | Freq: Three times a day (TID) | ORAL | 3 refills | Status: AC

## 2024-08-29 ENCOUNTER — Ambulatory Visit: Admitting: Family Medicine
